# Patient Record
Sex: Female | Born: 1935 | Race: White | Hispanic: No | Marital: Married | Smoking: Never smoker
Health system: Southern US, Community
[De-identification: ages and names within clinical notes are randomized; demographics above are authoritative.]

## PROBLEM LIST (undated history)

## (undated) DIAGNOSIS — I639 Cerebral infarction, unspecified: Secondary | ICD-10-CM

## (undated) DIAGNOSIS — I82409 Acute embolism and thrombosis of unspecified deep veins of unspecified lower extremity: Secondary | ICD-10-CM

## (undated) DIAGNOSIS — K635 Polyp of colon: Secondary | ICD-10-CM

## (undated) DIAGNOSIS — I4891 Unspecified atrial fibrillation: Secondary | ICD-10-CM

## (undated) DIAGNOSIS — I10 Essential (primary) hypertension: Secondary | ICD-10-CM

## (undated) DIAGNOSIS — I2510 Atherosclerotic heart disease of native coronary artery without angina pectoris: Secondary | ICD-10-CM

## (undated) DIAGNOSIS — Z86718 Personal history of other venous thrombosis and embolism: Secondary | ICD-10-CM

## (undated) DIAGNOSIS — E785 Hyperlipidemia, unspecified: Secondary | ICD-10-CM

## (undated) DIAGNOSIS — K317 Polyp of stomach and duodenum: Secondary | ICD-10-CM

## (undated) HISTORY — DX: Cerebral infarction, unspecified: I63.9

## (undated) HISTORY — DX: Personal history of other venous thrombosis and embolism: Z86.718

## (undated) HISTORY — DX: Atherosclerotic heart disease of native coronary artery without angina pectoris: I25.10

## (undated) HISTORY — PX: OTHER SURGICAL HISTORY: SHX169

## (undated) HISTORY — DX: Polyp of colon: K63.5

## (undated) HISTORY — DX: Unspecified atrial fibrillation: I48.91

## (undated) HISTORY — DX: Essential (primary) hypertension: I10

## (undated) HISTORY — DX: Polyp of stomach and duodenum: K31.7

## (undated) HISTORY — DX: Acute embolism and thrombosis of unspecified deep veins of unspecified lower extremity: I82.409

## (undated) HISTORY — DX: Hyperlipidemia, unspecified: E78.5

---
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
## 1963-12-22 HISTORY — PX: ABDOMINAL HYSTERECTOMY: SHX81

## 1970-12-21 HISTORY — PX: BREAST SURGERY: SHX581

## 1973-12-21 HISTORY — PX: LAMINECTOMY: SHX219

## 1975-12-22 HISTORY — PX: THYROID SURGERY: SHX805

## 2004-12-18 ENCOUNTER — Ambulatory Visit: Payer: Self-pay | Admitting: Urology

## 2006-01-05 ENCOUNTER — Ambulatory Visit: Payer: Self-pay | Admitting: Unknown Physician Specialty

## 2006-01-05 ENCOUNTER — Other Ambulatory Visit: Payer: Self-pay

## 2006-01-18 ENCOUNTER — Ambulatory Visit: Payer: Self-pay | Admitting: Unknown Physician Specialty

## 2006-04-20 DIAGNOSIS — Z86718 Personal history of other venous thrombosis and embolism: Secondary | ICD-10-CM | POA: Insufficient documentation

## 2006-04-20 DIAGNOSIS — I82409 Acute embolism and thrombosis of unspecified deep veins of unspecified lower extremity: Secondary | ICD-10-CM

## 2006-04-20 HISTORY — DX: Acute embolism and thrombosis of unspecified deep veins of unspecified lower extremity: I82.409

## 2006-04-20 HISTORY — DX: Personal history of other venous thrombosis and embolism: Z86.718

## 2006-05-03 ENCOUNTER — Inpatient Hospital Stay: Payer: Self-pay | Admitting: Internal Medicine

## 2006-05-03 ENCOUNTER — Other Ambulatory Visit: Payer: Self-pay

## 2006-05-05 ENCOUNTER — Other Ambulatory Visit: Payer: Self-pay

## 2006-08-18 ENCOUNTER — Ambulatory Visit: Payer: Self-pay | Admitting: Internal Medicine

## 2006-12-21 DIAGNOSIS — K635 Polyp of colon: Secondary | ICD-10-CM

## 2006-12-21 HISTORY — PX: NASAL POLYP SURGERY: SHX186

## 2006-12-21 HISTORY — DX: Polyp of colon: K63.5

## 2007-03-09 ENCOUNTER — Ambulatory Visit: Payer: Self-pay | Admitting: Gastroenterology

## 2007-08-03 ENCOUNTER — Ambulatory Visit: Payer: Self-pay | Admitting: Unknown Physician Specialty

## 2008-02-03 ENCOUNTER — Ambulatory Visit: Payer: Self-pay | Admitting: Internal Medicine

## 2008-05-23 ENCOUNTER — Encounter: Payer: Self-pay | Admitting: Unknown Physician Specialty

## 2008-06-20 ENCOUNTER — Encounter: Payer: Self-pay | Admitting: Unknown Physician Specialty

## 2008-07-21 ENCOUNTER — Encounter: Payer: Self-pay | Admitting: Unknown Physician Specialty

## 2009-02-25 ENCOUNTER — Emergency Department: Payer: Self-pay | Admitting: Emergency Medicine

## 2009-03-05 ENCOUNTER — Ambulatory Visit: Payer: Self-pay | Admitting: Internal Medicine

## 2009-04-17 ENCOUNTER — Ambulatory Visit: Payer: Self-pay | Admitting: Family

## 2009-06-11 ENCOUNTER — Encounter: Payer: Self-pay | Admitting: Cardiovascular Disease

## 2009-06-13 ENCOUNTER — Encounter: Payer: Self-pay | Admitting: Cardiovascular Disease

## 2009-06-14 ENCOUNTER — Encounter: Payer: Self-pay | Admitting: Cardiovascular Disease

## 2009-07-01 ENCOUNTER — Encounter: Payer: Self-pay | Admitting: Internal Medicine

## 2009-07-08 ENCOUNTER — Encounter: Payer: Self-pay | Admitting: Cardiovascular Disease

## 2009-07-25 ENCOUNTER — Ambulatory Visit: Payer: Self-pay | Admitting: Internal Medicine

## 2009-08-08 ENCOUNTER — Ambulatory Visit: Payer: Self-pay | Admitting: Internal Medicine

## 2009-12-26 ENCOUNTER — Other Ambulatory Visit: Payer: Self-pay | Admitting: Family

## 2009-12-29 ENCOUNTER — Ambulatory Visit: Payer: Self-pay | Admitting: Internal Medicine

## 2010-06-18 ENCOUNTER — Encounter: Payer: Self-pay | Admitting: Cardiovascular Disease

## 2010-08-27 ENCOUNTER — Ambulatory Visit: Payer: Self-pay | Admitting: Cardiovascular Disease

## 2010-08-27 DIAGNOSIS — E785 Hyperlipidemia, unspecified: Secondary | ICD-10-CM

## 2010-08-27 DIAGNOSIS — R079 Chest pain, unspecified: Secondary | ICD-10-CM

## 2010-08-27 DIAGNOSIS — I4891 Unspecified atrial fibrillation: Secondary | ICD-10-CM | POA: Insufficient documentation

## 2010-08-27 DIAGNOSIS — R9439 Abnormal result of other cardiovascular function study: Secondary | ICD-10-CM

## 2010-09-22 ENCOUNTER — Emergency Department: Payer: Self-pay | Admitting: Emergency Medicine

## 2010-11-20 ENCOUNTER — Telehealth: Payer: Self-pay | Admitting: Cardiovascular Disease

## 2011-01-20 NOTE — Progress Notes (Signed)
Summary: RX  Phone Note Refill Request Call back at Home Phone [phone] Message from:  Patient on November 20, 2010 2:49 PM  Refills Requested: Medication #1:  ISOSORBIDE MONONITRATE CR 60 MG XR24H-TAB Take one tablet by mouth daily [facility] in [place]  Initial call taken by: Harlon Flor,  November 20, 2010 2:50 PM  Follow-up for Phone Call        Rx faxed to pharmacy Follow-up by: Bishop Dublin, CMA,  November 20, 2010 2:53 PM    Prescriptions: ISOSORBIDE MONONITRATE CR 60 MG XR24H-TAB (ISOSORBIDE MONONITRATE) Take one tablet by mouth daily  #30 x 3   Entered by:   Bishop Dublin, CMA   Authorized by:   Dossie Arbour MD   Signed by:   Bishop Dublin, CMA on 11/20/2010   Method used:   Electronically to        [facility]. # [phone]* (retail)       [address]       [place], [place]  [address]       Ph: [phone]       Fax: [phone]   RxID:   [id number]

## 2011-01-20 NOTE — Letter (Signed)
Summary: Historic Patient File  Historic Patient File   Imported By: West Carbo 06/18/2010 15:31:34  _____________________________________________________________________  External Attachment:    Type:   Image     Comment:   External Document

## 2011-01-20 NOTE — Miscellaneous (Signed)
Summary: refills  Clinical Lists Changes  Medications: Added new medication of NITROSTAT 0.4 MG SUBL (NITROGLYCERIN) 1 tablet under tongue at onset of chest pain; you may repeat every 5 minutes for up to 3 doses. - Signed Added new medication of ISOSORBIDE MONONITRATE CR 60 MG XR24H-TAB (ISOSORBIDE MONONITRATE) Take one tablet by mouth daily - Signed Rx of NITROSTAT 0.4 MG SUBL (NITROGLYCERIN) 1 tablet under tongue at onset of chest pain; you may repeat every 5 minutes for up to 3 doses.;  #25 x 0;  Signed;  Entered by: Benedict Needy, RN;  Authorized by: Lorine Bears MD;  Method used: Electronically to [facility] # [phone]*, [address] [place], [place], [place]  [address], Ph: [phone], Fax: [phone] Rx of ISOSORBIDE MONONITRATE CR 60 MG XR24H-TAB (ISOSORBIDE MONONITRATE) Take one tablet by mouth daily;  #30 x 0;  Signed;  Entered by: Benedict Needy, RN;  Authorized by: Lorine Bears MD;  Method used: Electronically to [facility] # [phone]*, [address] [place], [place], [place]  [address], Ph: [phone], Fax: [phone]    Prescriptions: ISOSORBIDE MONONITRATE CR 60 MG XR24H-TAB (ISOSORBIDE MONONITRATE) Take one tablet by mouth daily  #30 x 0   Entered by:   Benedict Needy, RN   Authorized by:   Lorine Bears MD   Signed by:   Benedict Needy, RN on 06/18/2010   Method used:   Electronically to        [facility]. # [id number]* (retail)       [address]       [place], [place]  [address]       Ph: [phone]       Fax: [phone]   RxID:   [id number] NITROSTAT 0.4 MG SUBL (NITROGLYCERIN) 1 tablet under tongue at onset of chest pain; you may repeat every 5 minutes for up to 3 doses.  #25 x 0   Entered by:   Benedict Needy, RN   Authorized by:   Lorine Bears MD   Signed by:   Benedict Needy, RN on 06/18/2010   Method used:   Electronically to        [facility]. # [phone]* (retail)       [address]       [place], [place]  [address]       Ph: [phone]       Fax: [phone]   RxID:   [id number]

## 2011-01-20 NOTE — Letter (Signed)
Summary: External Correspondence  External Correspondence   Imported By: West Carbo 06/18/2010 15:31:51  _____________________________________________________________________  External Attachment:    Type:   Image     Comment:   External Document

## 2011-01-20 NOTE — Letter (Signed)
Summary: PHI  PHI   Imported By: Harlon Flor 09/02/2010 13:48:17  _____________________________________________________________________  External Attachment:    Type:   Image     Comment:   External Document

## 2011-01-20 NOTE — Assessment & Plan Note (Signed)
Summary: NEW PT   Visit Type:  Initial Consult Referring Sahvannah Rieser:  Dr. Kirke Corin Primary Lawerence Dery:  Darrick Huntsman  CC:  Establish care with cardiology.  She also follow Dr. Wyn Quaker for vascular..  History of Present Illness: [name] is a very pleasant 77 year old woman with a history of atrial fibrillation on warfarin (permenant), , hyperlipidemia with notes indicating elevated liver function tests on statins, history of DVT, hypertension, bilateral mastectomy, presenting to establish care. She is a patient of Dr. Darrick Huntsman.  She reports that overall she is in good health. She has had significant workup done by Dr. Kirke Corin in 2010 including an echocardiogram and stress test. she denies any significant chest discomfort, no significant shortness of breath with exertion. Her gait is mildly unsteady and she does use a cane for longer distances.  Stress test dated June 2010 suggests mild ischemia in the inferolateral region, normal systolic function, ejection fraction 70%. Normal wall motion. This was a pharmacologic study.  Echocardiogram dated June 2000 and shows moderately dilated left atrium and right atrium, normal systolic function, moderate tricuspid regurgitation, moderate mitral regurgitation, mild pulmonary hypertension. Right ventricular systolic pressure of 42.  EKG shows atrial ablation with ventricular rate 64 beats per minute, poor R-wave progression through the precordial leads  Current Medications (verified): 1)  Nitrostat 0.4 Mg Subl (Nitroglycerin) .Marland Kitchen.. 1 Tablet Under Tongue At Onset of Chest Pain; You May Repeat Every 5 Minutes For Up To 3 Doses. 2)  Isosorbide Mononitrate Cr 60 Mg Xr24h-Tab (Isosorbide Mononitrate) .... Take One Tablet By Mouth Daily 3)  Calcium Carbonate 600 Mg Tabs (Calcium Carbonate) .Marland Kitchen.. 1 Tab Once Daily 4)  Indapamide 2.5 Mg Tabs (Indapamide) .Marland Kitchen.. 1 Tab Once Daily 5)  Klor-Con 10 10 Meq Cr-Tabs (Potassium Chloride) .... Two Tablets Once Daily 6)  Lisinopril 20 Mg Tabs  (Lisinopril) .Marland Kitchen.. 1 Tab Once Daily 7)  Metoprolol Tartrate 25 Mg Tabs (Metoprolol Tartrate) .... 1/2 Tab Two Times A Day 8)  Nystatin 100000 Unit Tabs (Nystatin) .Marland Kitchen.. 1 Tab Once Daily 9)  Warfarin Sodium 4 Mg Tabs (Warfarin Sodium) .Marland Kitchen.. 1 Tab Once Daily 10)  Coumadin 6 Mg Tabs (Warfarin Sodium) .... Take One Tablet On Friday 11)  Maalox Max 1000-60 Mg Chew (Calcium Carbonate-Simethicone) .... As Needed 12)  Optive Eye Drops .... As Needed  Allergies (verified): No Known Drug Allergies  Past History:  Past Medical History: Last updated: 06/25/2010 Atrial Fibrillation CAD Hyperlipidemia Hypertension  Past Surgical History: Last updated: 06/25/2010 laser surgery for varicose veins  Social History: Last updated: 06/25/2010 Tobacco Use - No.  Alcohol Use - yes- one glas of wine per day  Risk Factors: Smoking Status: never (06/25/2010)  Review of Systems       The patient complains of difficulty walking.  The patient denies fever, weight loss, weight gain, vision loss, decreased hearing, hoarseness, chest pain, syncope, dyspnea on exertion, peripheral edema, prolonged cough, abdominal pain, incontinence, muscle weakness, depression, and enlarged lymph nodes.    Vital Signs:  Patient profile:   77 year old female Height:      66 inches Weight:      130.50 pounds Pulse rate:   60 / minute BP sitting:   146 / 48  (left arm) Cuff size:   regular  Vitals Entered By: Bishop Dublin, CMA (August 27, 2010 2:12 PM)  Physical Exam  General:  Well developed, well nourished, in no acute distress. Head:  normocephalic and atraumatic Neck:  Neck supple, no JVD. No masses, thyromegaly or  abnormal cervical nodes. Lungs:  Clear bilaterally to auscultation and percussion. Heart:  Non-displaced PMI, chest non-tender; regular rate and rhythm, S1, S2 without murmurs, rubs or gallops. Carotid upstroke normal, no bruit.  Pedals normal pulses. No edema, no varicosities. Abdomen:   abdomen  soft and non-tender without masses Msk:  Back normal, normal gait. Muscle strength and tone normal. Pulses:  pulses normal in all 4 extremities Extremities:  No clubbing or cyanosis. Neurologic:  Alert and oriented x 3. Skin:  Intact without lesions or rashes. Psych:  Normal affect.   Impression & Recommendations:  Problem # 1:  ATRIAL FIBRILLATION (ICD-427.31) chronic atrial fibrillation with good rate control on low dose metoprolol. She will continue to have her INR checked by Dr. Darrick Huntsman.  Her updated medication list for this problem includes:    Metoprolol Tartrate 25 Mg Tabs (Metoprolol tartrate) .Marland Kitchen... 1/2 tab two times a day    Warfarin Sodium 4 Mg Tabs (Warfarin sodium) .Marland Kitchen... 1 tab once daily    Coumadin 6 Mg Tabs (Warfarin sodium) .Marland Kitchen... Take one tablet on friday  Problem # 2:  HYPERLIPIDEMIA-MIXED (ICD-272.4) She does report having elevated cholesterol. On medication, her lipids were well controlled though she reports having elevated LFTs. Other options would be to try alternate medications such as zetia or WelChol.  Problem # 3:  ABNORMAL CV (STRESS) TEST (ICD-794.39) There was a small region of ischemia in the inferolateral wall last year on stress testing though she has no symptoms. I would agree with close monitoring, continued exercise around her property. Have asked her to contact me if she has any symptoms of chest pressure or chest discomfort. She is not on aspirin as she is on warfarin.  Other Orders: EKG w/ Interpretation (93000)

## 2011-04-13 ENCOUNTER — Ambulatory Visit (INDEPENDENT_AMBULATORY_CARE_PROVIDER_SITE_OTHER): Payer: MEDICARE | Admitting: Cardiovascular Disease

## 2011-04-13 ENCOUNTER — Encounter: Payer: Self-pay | Admitting: Cardiovascular Disease

## 2011-04-13 DIAGNOSIS — E785 Hyperlipidemia, unspecified: Secondary | ICD-10-CM

## 2011-04-13 DIAGNOSIS — I4891 Unspecified atrial fibrillation: Secondary | ICD-10-CM

## 2011-04-13 DIAGNOSIS — I10 Essential (primary) hypertension: Secondary | ICD-10-CM | POA: Insufficient documentation

## 2011-04-13 NOTE — Patient Instructions (Signed)
You are doing well. No medication changes were made. Please call us if you have new issues that need to be addressed before your next appt.  We will call you for a follow up Appt. In 6 months  

## 2011-04-13 NOTE — Progress Notes (Signed)
   Patient ID: Latoya Merritt, female    DOB: 08/19/1920, 77 y.o.   MRN: [id number]  HPI Comments: Latoya Merritt is a very pleasant 77 year old woman, patient of Dr. Darrick Huntsman, with a history of chronic atrial fibrillation on warfarin, hyperlipidemia with notes indicating elevated liver function tests on statins, history of DVT, hypertension, bilateral mastectomy, presenting for routine follow up.   She has been doing well with does report an issue when she had a glass of wine on an empty stomach. She ended up passing out, had workup done at [facility] in the hospital and was discharged later that night. This was in late 2011. She has not had any further episodes since that time. She does have periodic swelling in her legs though has had laser vein ablation.   she denies any significant chest discomfort, no significant shortness of breath with exertion. Her gait is mildly unsteady and she does use a cane for longer distances.  She has had significant workup done by Dr. Kirke Corin in 2010 including an echocardiogram and stress test.  Stress test dated June 2010 suggests mild ischemia in the inferolateral region, normal systolic function, ejection fraction 70%. Normal wall motion. This was a pharmacologic study.  Echocardiogram dated June 2000 and shows moderately dilated left atrium and right atrium, normal systolic function, moderate tricuspid regurgitation, moderate mitral regurgitation, mild pulmonary hypertension. Right ventricular systolic pressure of 42.  EKG shows atrial fibrillation with ventricular rate 57 beats per minute, poor R-wave progression through the precordial leads, Nonspecific ST changes in leads 2, 3, aVF     Review of Systems  Constitutional: Negative.   HENT: Negative.   Eyes: Negative.   Respiratory: Negative.   Cardiovascular: Negative.   Gastrointestinal: Negative.   Skin: Negative.   Neurological: Negative.   Hematological: Negative.   Psychiatric/Behavioral: Negative.   All  other systems reviewed and are negative.   BP 120/62  Pulse 57  Ht 5\' 6"  (1.676 m)  Wt 131 lb 12.8 oz (59.784 kg)  BMI 21.27 kg/m2   Physical Exam  Nursing note and vitals reviewed. Constitutional: She is oriented to person, place, and time. She appears well-developed and well-nourished.       Elderly woman in no apparent distress.  HENT:  Head: Normocephalic.  Nose: Nose normal.  Mouth/Throat: Oropharynx is clear and moist.  Eyes: Conjunctivae are normal. Pupils are equal, round, and reactive to light.  Neck: Normal range of motion. Neck supple. No JVD present.  Cardiovascular: Normal rate, regular rhythm, normal heart sounds and intact distal pulses.  Exam reveals no gallop and no friction rub.   No murmur heard. Pulmonary/Chest: Effort normal and breath sounds normal. No respiratory distress. She has no wheezes. She has no rales. She exhibits no tenderness.  Abdominal: Soft. Bowel sounds are normal. She exhibits no distension. There is no tenderness.  Musculoskeletal: Normal range of motion. She exhibits no edema and no tenderness.  Lymphadenopathy:    She has no cervical adenopathy.  Neurological: She is alert and oriented to person, place, and time. Coordination normal.  Skin: Skin is warm and dry. No rash noted. No erythema.  Psychiatric: She has a normal mood and affect. Her behavior is normal. Judgment and thought content normal.         Assessment and Plan

## 2011-04-13 NOTE — Assessment & Plan Note (Signed)
Heart rate is well controlled. We have suggested she stay on her current medication regimen. She is currently on warfarin.

## 2011-04-13 NOTE — Assessment & Plan Note (Signed)
Blood pressure is well controlled on today's visit. No changes made to the medications. 

## 2011-04-13 NOTE — Assessment & Plan Note (Signed)
We have suggested she stay on her current cholesterol medication. We will try to obtain her most recent cholesterol numbers for our records.

## 2011-05-27 LAB — PROTIME-INR

## 2011-05-27 LAB — POCT INR: INR: 5.2 — AB (ref <=1.1)

## 2011-07-21 ENCOUNTER — Telehealth: Payer: Self-pay

## 2011-07-21 MED ORDER — ISOSORBIDE MONONITRATE ER 60 MG PO TB24: 60.0000 mg | ORAL_TABLET | Freq: Every day | ORAL | Status: DC

## 2011-07-21 NOTE — Telephone Encounter (Signed)
Needs a refill isosorbide mono sent to [facility] in [place].

## 2011-07-21 NOTE — Telephone Encounter (Signed)
Needs a refill sent to [facility] in [place] for isosorbide mono

## 2011-08-29 ENCOUNTER — Other Ambulatory Visit: Payer: Self-pay | Admitting: Internal Medicine

## 2011-09-09 ENCOUNTER — Telehealth: Payer: Self-pay | Admitting: *Deleted

## 2011-09-09 DIAGNOSIS — Z7901 Long term (current) use of anticoagulants: Secondary | ICD-10-CM

## 2011-09-09 LAB — PROTIME-INR

## 2011-09-09 NOTE — Telephone Encounter (Signed)
Patient is asking for an order to have her protime checked done at [facility] due to her insurance. She is asking for a standing order to be faxed over there. I have printed out the order for Dr. Darrick Huntsman to sign so that it can be faxed to [facility].

## 2011-09-09 NOTE — Telephone Encounter (Signed)
Faxed standing order for PT/INR to [facility].

## 2011-09-15 ENCOUNTER — Telehealth: Payer: Self-pay | Admitting: Internal Medicine

## 2011-09-15 NOTE — Telephone Encounter (Signed)
See below note.

## 2011-09-15 NOTE — Telephone Encounter (Signed)
PT HAD PT/INR DONE LAST Thursday @ [facility] SHE WOULD LIKE TO KNOW THE RESULTS.  DR Sibyl Parr MCQUEEN GAVE HER RX FOR A VIRUS   THE RX ARE HYDROCODONE CHLORPHEN ER SUSPENSION  TAKE  AT NIGHT AND CLARITHROMYCIN TAKE 1 DAILY  AND SHE TAKES 4MG  WORFIN EVERY DAY.    PLEASE ADVISE LABS RESULTS

## 2011-09-15 NOTE — Telephone Encounter (Signed)
her PT/INR was therapuetic as of 9/19.  But if she has been taking clarithromycin it may raise her coumadin level for a few days.  I would recommend taking  coumadin every other night while she is on the clarithromycin to avoid her blood getting too thin.

## 2011-09-16 NOTE — Telephone Encounter (Signed)
Notified patient of coumadin results.  Also notified her while taking clarithromycin take coumadin every other night to avoid her blood getting too thin.

## 2011-09-18 ENCOUNTER — Telehealth: Payer: Self-pay | Admitting: Internal Medicine

## 2011-09-18 NOTE — Telephone Encounter (Signed)
error 

## 2011-09-18 NOTE — Telephone Encounter (Signed)
Please give her the 4 :30 appt today

## 2011-09-18 NOTE — Telephone Encounter (Signed)
Pt still has a lot of phlagm causing pt to cough.  Pt said her left leg  And knee is hurting when walking.  This hurts all the way to the hip area

## 2011-09-18 NOTE — Telephone Encounter (Signed)
please give her an appt

## 2011-09-21 ENCOUNTER — Encounter: Payer: Self-pay | Admitting: Internal Medicine

## 2011-09-21 ENCOUNTER — Ambulatory Visit (INDEPENDENT_AMBULATORY_CARE_PROVIDER_SITE_OTHER): Payer: Self-pay | Admitting: Internal Medicine

## 2011-09-21 VITALS — BP 140/72 | HR 72 | Temp 98.2°F | Resp 16 | Ht 66.5 in | Wt 129.2 lb

## 2011-09-21 DIAGNOSIS — Z7901 Long term (current) use of anticoagulants: Secondary | ICD-10-CM

## 2011-09-21 DIAGNOSIS — I10 Essential (primary) hypertension: Secondary | ICD-10-CM

## 2011-09-21 DIAGNOSIS — I4891 Unspecified atrial fibrillation: Secondary | ICD-10-CM

## 2011-09-21 DIAGNOSIS — J399 Disease of upper respiratory tract, unspecified: Secondary | ICD-10-CM

## 2011-09-21 DIAGNOSIS — J398 Other specified diseases of upper respiratory tract: Secondary | ICD-10-CM

## 2011-09-21 DIAGNOSIS — Z86718 Personal history of other venous thrombosis and embolism: Secondary | ICD-10-CM | POA: Insufficient documentation

## 2011-09-21 DIAGNOSIS — Z0189 Encounter for other specified special examinations: Secondary | ICD-10-CM

## 2011-09-21 DIAGNOSIS — Z79899 Other long term (current) drug therapy: Secondary | ICD-10-CM

## 2011-09-21 MED ORDER — NITROGLYCERIN 0.4 MG SL SUBL: 0.4000 mg | SUBLINGUAL_TABLET | SUBLINGUAL | Status: DC | PRN

## 2011-09-21 NOTE — Patient Instructions (Addendum)
I want you to try Delsym for nighttime cough.  It is available over the counter.   Resume your prescription nasal spray,  But use Simply Saline nasal spray twice daily to flush sinuses out BEFORE you use the prescription nasal spray.

## 2011-09-21 NOTE — Progress Notes (Signed)
Subjective:    Patient ID: Latoya Merritt, female    DOB: 1920-09-10, 77 y.o.   MRN: [id number]  HPI 77 yo wf with history of atrial fibrillation , on chronic anticoagulation, treated last Wednesday for unresolving URI by ENT with clarithromycin and tussionex on 09/14/11. She developed rash on chest and legs which she has attributed to an allergic reaction to both medications. Her sinus pain and purulent discharge have resolved.   Past Medical History  Diagnosis Date  . Coronary artery disease   . Atrial fibrillation   . Polyp, stomach   . Colon polyp 2008  . Sensorineural hearing loss 2006    sudden, left ear  . DVT (deep venous thrombosis) May 2007  . History of DVT of lower extremity May 2007  . Hyperlipidemia   . Hypertension     Current Outpatient Prescriptions on File Prior to Visit  Medication Sig Dispense Refill  . calcium carbonate (OS-CAL) 600 MG TABS Take 600 mg by mouth daily.        . Calcium Carbonate-Simethicone (MAALOX ADVANCED MAX ST) 1000-60 MG CHEW Chew 1 tablet by mouth as needed.        . Carboxymethylcellul-Glycerin 0.5-0.9 % SOLN Apply to eye as needed.        . isosorbide mononitrate (IMDUR) 60 MG 24 hr tablet Take 1 tablet (60 mg total) by mouth daily.  30 tablet  6  . KLOR-CON 10 10 MEQ CR tablet TAKE 2 TABLETS BY MOUTH EVERY DAY  60 tablet  0  . lisinopril (PRINIVIL,ZESTRIL) 20 MG tablet Take 20 mg by mouth daily.        . metoprolol tartrate (LOPRESSOR) 25 MG tablet Take 12.5 mg by mouth 2 (two) times daily.        Marland Kitchen nystatin 100000 UNITS vaginal tablet Place 1 tablet vaginally at bedtime.        Marland Kitchen warfarin (COUMADIN) 4 MG tablet Take 4 mg by mouth daily.          Review of Systems  Constitutional: Negative for fever, chills and unexpected weight change.  HENT: Negative for hearing loss, ear pain, nosebleeds, congestion, sore throat, facial swelling, rhinorrhea, sneezing, mouth sores, trouble swallowing, neck pain, neck stiffness, voice change, postnasal  drip, sinus pressure, tinnitus and ear discharge.   Eyes: Negative for pain, discharge, redness and visual disturbance.  Respiratory: Negative for cough, chest tightness, shortness of breath, wheezing and stridor.   Cardiovascular: Negative for chest pain, palpitations and leg swelling.  Musculoskeletal: Negative for myalgias and arthralgias.  Skin: Negative for color change and rash.  Neurological: Negative for dizziness, weakness, light-headedness and headaches.  Hematological: Negative for adenopathy.       Objective:   Physical Exam  Constitutional: She is oriented to person, place, and time. She appears well-developed and well-nourished.  HENT:  Mouth/Throat: Oropharynx is clear and moist.  Eyes: EOM are normal. Pupils are equal, round, and reactive to light. No scleral icterus.  Neck: Normal range of motion. Neck supple. No JVD present. No thyromegaly present.  Cardiovascular: Normal rate, regular rhythm, normal heart sounds and intact distal pulses.   Pulmonary/Chest: Effort normal and breath sounds normal.  Abdominal: Soft. Bowel sounds are normal. She exhibits no mass. There is no tenderness.  Musculoskeletal: Normal range of motion. She exhibits no edema.  Lymphadenopathy:    She has no cervical adenopathy.  Neurological: She is alert and oriented to person, place, and time.  Skin: Skin is warm and dry.  Psychiatric: She has a normal mood and affect.          Assessment & Plan:  Sinusitis:  Her recent infection was treated for a few days before she stopped her antibiotics due to an allergic reaction to one or both.  She is currently asymptomatic except for a mild dry nighttime cough. Will treat cough but not resume antibiotics.

## 2011-09-22 ENCOUNTER — Encounter: Payer: Self-pay | Admitting: Internal Medicine

## 2011-09-22 DIAGNOSIS — I10 Essential (primary) hypertension: Secondary | ICD-10-CM | POA: Insufficient documentation

## 2011-09-22 DIAGNOSIS — E785 Hyperlipidemia, unspecified: Secondary | ICD-10-CM | POA: Insufficient documentation

## 2011-09-22 DIAGNOSIS — K635 Polyp of colon: Secondary | ICD-10-CM | POA: Insufficient documentation

## 2011-09-22 NOTE — Assessment & Plan Note (Signed)
Currently ate controlled and anticoagulated.  No changes today

## 2011-09-22 NOTE — Assessment & Plan Note (Signed)
Currently controlled,. No medication changes today.

## 2011-09-28 ENCOUNTER — Other Ambulatory Visit: Payer: Self-pay | Admitting: Internal Medicine

## 2011-09-29 ENCOUNTER — Encounter: Payer: Self-pay | Admitting: Internal Medicine

## 2011-10-12 ENCOUNTER — Telehealth: Payer: Self-pay | Admitting: *Deleted

## 2011-10-12 NOTE — Telephone Encounter (Signed)
Message copied by Jobie Quaker on Mon Oct 12, 2011  2:33 PM ------      Message from: Duncan Dull      Created: Mon Oct 12, 2011  1:48 PM      Regarding: pt/inr       Her coumadin level is low.  Did she have to stop it for a procedure?

## 2011-10-12 NOTE — Telephone Encounter (Signed)
Patient notified

## 2011-10-12 NOTE — Telephone Encounter (Signed)
Patient says that she has not missed a dose and has been taking 4 mg daily.

## 2011-10-12 NOTE — Telephone Encounter (Signed)
Please ask her to take 8 mg tonight and on Thrusdays,  4 mg all other days. Repeat INR in 2 weeks

## 2011-10-21 LAB — PROTIME-INR

## 2011-10-22 ENCOUNTER — Encounter: Payer: Self-pay | Admitting: Cardiovascular Disease

## 2011-10-26 ENCOUNTER — Encounter: Payer: Self-pay | Admitting: Cardiovascular Disease

## 2011-10-26 ENCOUNTER — Ambulatory Visit (INDEPENDENT_AMBULATORY_CARE_PROVIDER_SITE_OTHER): Payer: Medicare Other | Admitting: Cardiovascular Disease

## 2011-10-26 ENCOUNTER — Telehealth: Payer: Self-pay | Admitting: Internal Medicine

## 2011-10-26 DIAGNOSIS — E785 Hyperlipidemia, unspecified: Secondary | ICD-10-CM

## 2011-10-26 DIAGNOSIS — M542 Cervicalgia: Secondary | ICD-10-CM | POA: Insufficient documentation

## 2011-10-26 DIAGNOSIS — Z86718 Personal history of other venous thrombosis and embolism: Secondary | ICD-10-CM

## 2011-10-26 DIAGNOSIS — I10 Essential (primary) hypertension: Secondary | ICD-10-CM

## 2011-10-26 DIAGNOSIS — I4891 Unspecified atrial fibrillation: Secondary | ICD-10-CM

## 2011-10-26 NOTE — Assessment & Plan Note (Signed)
She has had workup in the past for her neck pain. She reports that she has seen Dr. Jenne Campus, Dr. Wyn Quaker. It was suggested that she see a specialist for the swelling on the right side of her neck but she has not done so. It would appear that there has been no significant progression of the swelling. She has had rare episodes of pain shooting up to her right temple. Etiology is uncertain bypassed her to talk with Dr. Jenne Campus if she continues to have these episodes.

## 2011-10-26 NOTE — Telephone Encounter (Signed)
Notified patient of results.  She stated she saw Dr. Mariah Milling and mentioned to him that she has been having neck and chin pain and said there is a noticeable lump there.  On Friday, she said she had terrible pain in the temple area.  She said you checked her thyroid level in October but she has not heard from those results.  I will request from [facility].  Dr. Mariah Milling suggested she either follow up with you or Dr. Jenne Campus, but she wanted to know if this pain was coming from her thyroid before she made an appt anywhere.

## 2011-10-26 NOTE — Assessment & Plan Note (Signed)
Statins on hold secondary to elevated LFTs

## 2011-10-26 NOTE — Assessment & Plan Note (Signed)
Blood pressure is well controlled on today's visit. No changes made to the medications. 

## 2011-10-26 NOTE — Patient Instructions (Addendum)
You are doing well. No medication changes were made.  Please try zantac and pepcid for acid reflux.  Please call us if you have new issues that need to be addressed before your next appt.  The office will contact you for a follow up Appt. In 12 months

## 2011-10-26 NOTE — Telephone Encounter (Signed)
Coumadin level is therapeutic,  Continue current regimen and repeat PT/INR in one month 

## 2011-10-26 NOTE — Assessment & Plan Note (Signed)
History of DVT. Currently on warfarin.

## 2011-10-26 NOTE — Assessment & Plan Note (Signed)
Rate is well controlled on her current medication regimen. No further changes made.

## 2011-10-26 NOTE — Progress Notes (Signed)
Patient ID: Latoya Merritt, female    DOB: [birth year], 77 y.o.   MRN: [id number]  HPI Comments: Latoya Merritt is a very pleasant 77 year old woman with a history of atrial fibrillation on warfarin (permenant), , hyperlipidemia with notes indicating elevated liver function tests on statins, history of DVT, hypertension, bilateral mastectomy, presenting for routine followup She is a patient of Dr. Darrick Huntsman. echocardiogram and stress test in 2010   she denies any significant chest discomfort, no significant shortness of breath with exertion.  She does report having pain in the right side of her neck, radiating up to her temple. This has happened twice. She reports having some workup for a swelling on the right side of her neck though she did not followup with a specialist. She feels that the lump is about the same size. She does have mild swelling in her left leg.  Her gait is mildly unsteady and she does use a cane for longer distances. She does report having some burning in her chest at times, better with TUMS.   Stress test dated June 2010 suggests mild ischemia in the inferolateral region, normal systolic function, ejection fraction 70%. Normal wall motion. This was a pharmacologic study.   Echocardiogram dated June 2000 and shows moderately dilated left atrium and right atrium, normal systolic function, moderate tricuspid regurgitation, moderate mitral regurgitation, mild pulmonary hypertension. Right ventricular systolic pressure of 42.   EKG shows atrial Fibrillation with ventricular rate 67 beats per minute, poor R-wave progression through the precordial leads    Outpatient Encounter Prescriptions as of 10/26/2011  Medication Sig Dispense Refill  . calcium carbonate (OS-CAL) 600 MG TABS Take 600 mg by mouth daily.        . Calcium Carbonate-Simethicone (MAALOX ADVANCED MAX ST) 1000-60 MG CHEW Chew 1 tablet by mouth as needed.        . Carboxymethylcellul-Glycerin 0.5-0.9 % SOLN Apply to eye as  needed.        . isosorbide mononitrate (IMDUR) 60 MG 24 hr tablet Take 1 tablet (60 mg total) by mouth daily.  30 tablet  6  . KLOR-CON 10 10 MEQ CR tablet TAKE 2 TABLETS BY MOUTH EVERY DAY  60 tablet  3  . lisinopril (PRINIVIL,ZESTRIL) 20 MG tablet Take 20 mg by mouth daily.        . metoprolol tartrate (LOPRESSOR) 25 MG tablet Take 12.5 mg by mouth 2 (two) times daily.        . nitroGLYCERIN (NITROSTAT) 0.4 MG SL tablet Place 1 tablet (0.4 mg total) under the tongue every 5 (five) minutes as needed.  90 tablet  2  . nystatin 100000 UNITS vaginal tablet Place 1 tablet vaginally at bedtime.        Marland Kitchen warfarin (COUMADIN) 4 MG tablet Take 4 mg by mouth daily.           Review of Systems  Constitutional: Negative.   HENT: Negative.   Eyes: Negative.   Respiratory: Negative.   Cardiovascular: Positive for leg swelling.  Gastrointestinal: Negative.   Musculoskeletal: Negative.        Pain in the right aspect of her neck radiating up to her temple  Skin: Negative.   Neurological: Negative.   Hematological: Negative.   Psychiatric/Behavioral: Negative.   All other systems reviewed and are negative.     BP 122/64  Pulse 65  Ht 5\' 6"  (1.676 m)  Wt 130 lb (58.968 kg)  BMI 20.98 kg/m2  Physical Exam  Nursing note  and vitals reviewed. Constitutional: She is oriented to person, place, and time. She appears well-developed and well-nourished.  HENT:  Head: Normocephalic.  Nose: Nose normal.  Mouth/Throat: Oropharynx is clear and moist.  Eyes: Conjunctivae are normal. Pupils are equal, round, and reactive to light.  Neck: Normal range of motion. Neck supple. No JVD present.  Cardiovascular: Normal rate, S1 normal, S2 normal, normal heart sounds and intact distal pulses.  An irregularly irregular rhythm present. Exam reveals no gallop and no friction rub.   No murmur heard. Pulmonary/Chest: Effort normal and breath sounds normal. No respiratory distress. She has no wheezes. She has no  rales. She exhibits no tenderness.  Abdominal: Soft. Bowel sounds are normal. She exhibits no distension. There is no tenderness.  Musculoskeletal: Normal range of motion. She exhibits no edema and no tenderness.  Lymphadenopathy:    She has no cervical adenopathy.  Neurological: She is alert and oriented to person, place, and time. Coordination normal.  Skin: Skin is warm and dry. No rash noted. No erythema.  Psychiatric: She has a normal mood and affect. Her behavior is normal. Judgment and thought content normal.         Assessment and Plan

## 2011-10-27 ENCOUNTER — Telehealth: Payer: Self-pay | Admitting: Internal Medicine

## 2011-10-27 NOTE — Telephone Encounter (Signed)
Notified patient of message, she stated she will either make an appt to be seen with you or Dr. Jenne Campus.

## 2011-10-27 NOTE — Telephone Encounter (Signed)
She did not have a thyroid test in October,  Only a PT/INR and CMET

## 2011-10-27 NOTE — Telephone Encounter (Signed)
I have no idea, since I have not seen her about this, but im inclined to say "no" .  I don't know how a thyroid can cause temple pain

## 2011-10-27 NOTE — Telephone Encounter (Signed)
Notified patient of message.

## 2011-10-28 ENCOUNTER — Encounter: Payer: Self-pay | Admitting: Internal Medicine

## 2011-11-04 ENCOUNTER — Telehealth: Payer: Self-pay | Admitting: Internal Medicine

## 2011-11-04 NOTE — Telephone Encounter (Signed)
Patient having pain in neck behind ear . Needs a referral to someone that will tell her why she is having these pains.

## 2011-11-04 NOTE — Telephone Encounter (Signed)
Please offer her an appt. I do not refer patients  To specialists without seeing the patient first because I have no idea what specialist to send her to

## 2011-11-05 NOTE — Telephone Encounter (Signed)
I will call patient to set up appointment.

## 2011-11-06 NOTE — Telephone Encounter (Signed)
Patient appointment has been set.

## 2011-11-16 ENCOUNTER — Other Ambulatory Visit: Payer: Self-pay | Admitting: Internal Medicine

## 2011-11-16 MED ORDER — METOPROLOL TARTRATE 25 MG PO TABS: 12.5000 mg | ORAL_TABLET | Freq: Two times a day (BID) | ORAL | Status: DC

## 2011-11-16 NOTE — Telephone Encounter (Signed)
Metoprolol 12.5 mg (1/2 tablet 2 timesd daily )  #30 with 6 refills authorized

## 2011-11-17 ENCOUNTER — Ambulatory Visit (INDEPENDENT_AMBULATORY_CARE_PROVIDER_SITE_OTHER): Payer: Medicare Other | Admitting: Internal Medicine

## 2011-11-17 ENCOUNTER — Encounter: Payer: Self-pay | Admitting: Internal Medicine

## 2011-11-17 VITALS — BP 144/80 | HR 84 | Temp 98.2°F | Resp 14 | Ht 66.0 in | Wt 132.5 lb

## 2011-11-17 DIAGNOSIS — R221 Localized swelling, mass and lump, neck: Secondary | ICD-10-CM

## 2011-11-17 DIAGNOSIS — R220 Localized swelling, mass and lump, head: Secondary | ICD-10-CM

## 2011-11-17 NOTE — Patient Instructions (Signed)
You right sided neck pain is from a swollen gland that is reacting from your previous respiratory infection.    We should recheck it in one month and if still enlarged,  We will ask dr. Jenne Campus to biopsy it.

## 2011-11-17 NOTE — Progress Notes (Signed)
Subjective:    Patient ID: Latoya Merritt, female    DOB: [birth year], 77 y.o.   MRN: [id number]  HPI  77 yo white female with a history of atrial fibrillation, on chronic anticoagulation presents with a complaint of right sided neck swelling and pain from right temple to right clavicle aggravated by chewing.  Apparently she was evaluated already by Cardiology and ENT.  She was presumed to have an enlarged LN by Dr. Mariah Milling and states that she was sent to Dr. Jenne Campus for prednisone which was denied. Records from Dr. Jenne Campus not currently available.  Patient called my office requesting a referral to a "neck specialist" so I brought her in for clarification.  She states that her neck pain , headache and mass have now improved, but started around the first of October when she was treated for an Upper respiratory infection.  She has several family members who have been treated for various conditions (benign to malignant) involving the neck and is intent on describing each one at length, without enough detail for me to discern any possbile diagnosis, and is concerned that she has a similar condition.  She is concerned because she reports that there is a persistent "back spot" on prior imaging of her neck that has not been diagnosed as an aneursym or a tumor despite evaluation by Dr. Wyn Quaker and Dr. Jenne Campus.  Past Medical History  Diagnosis Date  . Coronary artery disease   . Atrial fibrillation   . Polyp, stomach   . Colon polyp 2008  . Sensorineural hearing loss 2006    sudden, left ear  . DVT (deep venous thrombosis) May 2007  . History of DVT of lower extremity May 2007  . Hyperlipidemia   . Hypertension    Current Outpatient Prescriptions on File Prior to Visit  Medication Sig Dispense Refill  . calcium carbonate (OS-CAL) 600 MG TABS Take 600 mg by mouth daily.        . Calcium Carbonate-Simethicone (MAALOX ADVANCED MAX ST) 1000-60 MG CHEW Chew 1 tablet by mouth as needed.        .  Carboxymethylcellul-Glycerin 0.5-0.9 % SOLN Apply to eye as needed.        . isosorbide mononitrate (IMDUR) 60 MG 24 hr tablet Take 1 tablet (60 mg total) by mouth daily.  30 tablet  6  . KLOR-CON 10 10 MEQ CR tablet TAKE 2 TABLETS BY MOUTH EVERY DAY  60 tablet  3  . lisinopril (PRINIVIL,ZESTRIL) 20 MG tablet Take 20 mg by mouth daily.        . metoprolol tartrate (LOPRESSOR) 25 MG tablet Take 0.5 tablets (12.5 mg total) by mouth 2 (two) times daily.  30 tablet  6  . nitroGLYCERIN (NITROSTAT) 0.4 MG SL tablet Place 1 tablet (0.4 mg total) under the tongue every 5 (five) minutes as needed.  90 tablet  2  . nystatin 100000 UNITS vaginal tablet Place 1 tablet vaginally at bedtime.        Marland Kitchen warfarin (COUMADIN) 4 MG tablet Take 4 mg by mouth daily.           Review of Systems  Constitutional: Negative for fever, chills and unexpected weight change.  HENT: Negative for hearing loss, ear pain, nosebleeds, congestion, sore throat, facial swelling, rhinorrhea, sneezing, mouth sores, trouble swallowing, neck pain, neck stiffness, voice change, postnasal drip, sinus pressure, tinnitus and ear discharge.   Eyes: Negative for pain, discharge, redness and visual disturbance.  Respiratory: Negative for cough, chest  tightness, shortness of breath, wheezing and stridor.   Cardiovascular: Negative for chest pain, palpitations and leg swelling.  Musculoskeletal: Negative for myalgias and arthralgias.  Skin: Negative for color change and rash.  Neurological: Positive for headaches. Negative for dizziness, weakness and light-headedness.  Hematological: Negative for adenopathy.   BP 144/80  Pulse 84  Temp(Src) 98.2 F (36.8 C) (Oral)  Resp 14  Ht 5\' 6"  (1.676 m)  Wt 132 lb 8 oz (60.102 kg)  BMI 21.39 kg/m2  SpO2 100%     Objective:   Physical Exam  Constitutional: She is oriented to person, place, and time. She appears well-developed and well-nourished.  HENT:  Mouth/Throat: Oropharynx is clear and  moist.  Eyes: EOM are normal. Pupils are equal, round, and reactive to light. No scleral icterus.  Neck: Normal range of motion. Neck supple. No JVD present. No thyromegaly present.  Cardiovascular: Normal rate, regular rhythm, normal heart sounds and intact distal pulses.   Pulmonary/Chest: Effort normal and breath sounds normal.  Abdominal: Soft. Bowel sounds are normal. She exhibits no mass. There is no tenderness.  Musculoskeletal: Normal range of motion. She exhibits no edema.  Lymphadenopathy:    She has no cervical adenopathy.  Neurological: She is alert and oriented to person, place, and time. A cranial nerve deficit is present.       persisent hoarsenes  Skin: Skin is warm and dry.  Psychiatric: Her speech is normal and behavior is normal. Thought content normal. Her mood appears anxious. Cognition and memory are normal. She expresses impulsivity.       Speech is normal but she is a bit garrulous and has difficulty staying on point       Assessment & Plan:  Neck mass: her exam today is nearly normal. I do appreciate an enlarged right anterior chain LN which is soft and mobile and nontender .  I suggested that we repeat an exam in one month if still enlarged we will ak Dr. Jenne Campus to biopsy it. Will review prior imaging studies and request records from Vascular and ENT to try to discern any undiagnosed pathology on the right that is casuing her any concern to try to alleviate her anxiety.

## 2011-11-18 ENCOUNTER — Encounter: Payer: Self-pay | Admitting: Internal Medicine

## 2011-11-19 ENCOUNTER — Telehealth: Payer: Self-pay | Admitting: Internal Medicine

## 2011-11-19 NOTE — Telephone Encounter (Signed)
Notified patient of results 

## 2011-11-19 NOTE — Telephone Encounter (Signed)
Her protiem is therapuietic,  Repeat in one month

## 2011-12-09 ENCOUNTER — Inpatient Hospital Stay: Payer: Self-pay | Admitting: Internal Medicine

## 2011-12-09 DIAGNOSIS — I059 Rheumatic mitral valve disease, unspecified: Secondary | ICD-10-CM

## 2011-12-11 ENCOUNTER — Other Ambulatory Visit: Payer: Self-pay | Admitting: *Deleted

## 2011-12-11 MED ORDER — WARFARIN SODIUM 4 MG PO TABS: 4.0000 mg | ORAL_TABLET | Freq: Every day | ORAL | Status: DC

## 2011-12-11 MED ORDER — PRAVASTATIN SODIUM 20 MG PO TABS: 20.0000 mg | ORAL_TABLET | Freq: Every day | ORAL | Status: DC

## 2011-12-11 MED ORDER — WARFARIN SODIUM 1 MG PO TABS: 1.0000 mg | ORAL_TABLET | ORAL | Status: DC

## 2011-12-11 NOTE — Telephone Encounter (Signed)
Refills sent to [facility] in [place]

## 2011-12-13 ENCOUNTER — Emergency Department: Payer: Self-pay | Admitting: Emergency Medicine

## 2011-12-17 ENCOUNTER — Telehealth: Payer: Self-pay | Admitting: *Deleted

## 2011-12-17 NOTE — Telephone Encounter (Signed)
Latoya Merritt with [facility] called. She was advising you that patient went to ER for leg swelling over the weekend. No blood clot found on exam there. PT assistant is at her house now and advises that her leg is warm but her foot is cold with decreased cap refill. She has appt tomorrow morning with Dr. Wyn Quaker (vascular). I advised to go to ER tonight if worsening or if SOB/CP. Otherwise await vascular consult tomorrow. She verbalized understanding.

## 2011-12-18 ENCOUNTER — Ambulatory Visit: Payer: Medicare Other | Admitting: Internal Medicine

## 2011-12-18 LAB — PROTIME-INR

## 2011-12-21 ENCOUNTER — Telehealth: Payer: Self-pay | Admitting: *Deleted

## 2011-12-21 NOTE — Telephone Encounter (Signed)
Advised pt that protime is high.  Per Dr. Darrick Huntsman, advised her to hold coumadin for today and tomorrow, restart at 4 mg's on Thursday, recheck PT/ INR on Monday 12/28/11.

## 2011-12-28 ENCOUNTER — Other Ambulatory Visit: Payer: Self-pay | Admitting: *Deleted

## 2011-12-28 LAB — PROTIME-INR

## 2011-12-28 MED ORDER — INDAPAMIDE 2.5 MG PO TABS: 2.5000 mg | ORAL_TABLET | ORAL | Status: DC

## 2011-12-30 ENCOUNTER — Ambulatory Visit (INDEPENDENT_AMBULATORY_CARE_PROVIDER_SITE_OTHER): Payer: 59 | Admitting: Internal Medicine

## 2011-12-30 ENCOUNTER — Telehealth: Payer: Self-pay | Admitting: Internal Medicine

## 2011-12-30 ENCOUNTER — Encounter: Payer: Self-pay | Admitting: Internal Medicine

## 2011-12-30 VITALS — BP 150/74 | HR 66 | Temp 98.4°F | Wt 131.0 lb

## 2011-12-30 DIAGNOSIS — K047 Periapical abscess without sinus: Secondary | ICD-10-CM

## 2011-12-30 DIAGNOSIS — R4189 Other symptoms and signs involving cognitive functions and awareness: Secondary | ICD-10-CM

## 2011-12-30 DIAGNOSIS — I639 Cerebral infarction, unspecified: Secondary | ICD-10-CM

## 2011-12-30 DIAGNOSIS — F09 Unspecified mental disorder due to known physiological condition: Secondary | ICD-10-CM

## 2011-12-30 DIAGNOSIS — Z7901 Long term (current) use of anticoagulants: Secondary | ICD-10-CM

## 2011-12-30 DIAGNOSIS — I6350 Cerebral infarction due to unspecified occlusion or stenosis of unspecified cerebral artery: Secondary | ICD-10-CM

## 2011-12-30 MED ORDER — DOXYCYCLINE HYCLATE 100 MG PO TABS: 100.0000 mg | ORAL_TABLET | Freq: Two times a day (BID) | ORAL | Status: AC

## 2011-12-30 MED ORDER — METRONIDAZOLE 500 MG PO TABS: 500.0000 mg | ORAL_TABLET | Freq: Three times a day (TID) | ORAL | Status: AC

## 2011-12-30 NOTE — Telephone Encounter (Signed)
Pro time is therapeutic, continue current dose, Can you  Set up a monthly repeat  For PT/INR  V58.61with [facility]

## 2011-12-30 NOTE — Telephone Encounter (Signed)
Patient notified. Faxed standing order to [facility]

## 2011-12-30 NOTE — Progress Notes (Signed)
Subjective:    Patient ID: Latoya Merritt, female    DOB: 19-Feb-1920, 78 y.o.   MRN: [id number]  HPI  Latoya Merritt is a very pleasant, garrulous 78 yr old woman with a history of atrial fibrillation, on chronic anticoagulation with coumadin who presents today for hospital follow up after having a mild left brain CVA  Which caused transient slurred speec and sustained right arm/hadn weakness which has been imporving with home PT.  She has not returned to driving yet but is now able to close her hand and make a fist.  She has had no follows of aidditonal epsiodes of slurred speech.  She has a new complaint of sudden onset of left maxillary pain at the TMJ location which occurred while eating a moderately soft meal of summer sausage on bread and potato chips.  The pain progressed and now she reports an ear ache. There is no change after using some unnamed drops in ears previously prescribed by ENT. She has been having dental work as recently as 3 weeks ago but on the opposite side.  Past Medical History  Diagnosis Date  . Coronary artery disease   . Atrial fibrillation   . Polyp, stomach   . Colon polyp 2008  . Sensorineural hearing loss 2006    sudden, left ear  . DVT (deep venous thrombosis) May 2007  . History of DVT of lower extremity May 2007  . Hyperlipidemia   . Hypertension    Current Outpatient Prescriptions on File Prior to Visit  Medication Sig Dispense Refill  . calcium carbonate (OS-CAL) 600 MG TABS Take 600 mg by mouth daily.        . Calcium Carbonate-Simethicone (MAALOX ADVANCED MAX ST) 1000-60 MG CHEW Chew 1 tablet by mouth as needed.        . Carboxymethylcellul-Glycerin 0.5-0.9 % SOLN Apply to eye as needed.        . indapamide (LOZOL) 2.5 MG tablet Take 1 tablet (2.5 mg total) by mouth every morning.  30 tablet  6  . KLOR-CON 10 10 MEQ CR tablet TAKE 2 TABLETS BY MOUTH EVERY DAY  60 tablet  3  . lisinopril (PRINIVIL,ZESTRIL) 20 MG tablet Take 20 mg by mouth daily.        .  metoprolol tartrate (LOPRESSOR) 25 MG tablet Take 0.5 tablets (12.5 mg total) by mouth 2 (two) times daily.  30 tablet  6  . nitroGLYCERIN (NITROSTAT) 0.4 MG SL tablet Place 1 tablet (0.4 mg total) under the tongue every 5 (five) minutes as needed.  90 tablet  2  . nystatin 100000 UNITS vaginal tablet Place 1 tablet vaginally at bedtime.        . pravastatin (PRAVACHOL) 20 MG tablet Take 1 tablet (20 mg total) by mouth daily.  90 tablet  1  . warfarin (COUMADIN) 1 MG tablet Take 1 tablet (1 mg total) by mouth as directed.  30 tablet  3  . warfarin (COUMADIN) 4 MG tablet Take 1 tablet (4 mg total) by mouth daily.  30 tablet  3    Review of Systems  Constitutional: Negative for fever, chills and unexpected weight change.  HENT: Negative for hearing loss, ear pain, nosebleeds, congestion, sore throat, facial swelling, rhinorrhea, sneezing, mouth sores, trouble swallowing, neck pain, neck stiffness, voice change, postnasal drip, sinus pressure, tinnitus and ear discharge.   Eyes: Negative for pain, discharge, redness and visual disturbance.  Respiratory: Negative for cough, chest tightness, shortness of breath, wheezing and stridor.  Cardiovascular: Negative for chest pain, palpitations and leg swelling.  Musculoskeletal: Negative for myalgias and arthralgias.  Skin: Negative for color change and rash.  Neurological: Positive for headaches. Negative for dizziness, weakness and light-headedness.  Hematological: Negative for adenopathy.  BP 150/74  Pulse 66  Temp(Src) 98.4 F (36.9 C) (Oral)  Wt 131 lb (59.421 kg)  SpO2 99%      Objective:   Physical Exam  Constitutional: She is oriented to person, place, and time. She appears well-developed and well-nourished.  HENT:  Mouth/Throat: Oropharynx is clear and moist.  Eyes: EOM are normal. Pupils are equal, round, and reactive to light. No scleral icterus.  Neck: Normal range of motion. Neck supple. No JVD present. No thyromegaly present.    Cardiovascular: Normal rate, regular rhythm, normal heart sounds and intact distal pulses.   Pulmonary/Chest: Effort normal and breath sounds normal.  Abdominal: Soft. Bowel sounds are normal. She exhibits no mass. There is no tenderness.  Musculoskeletal: Normal range of motion. She exhibits no edema.  Lymphadenopathy:    She has no cervical adenopathy.  Neurological: She is alert and oriented to person, place, and time. A cranial nerve deficit is present.       persisent hoarsenes  Skin: Skin is warm and dry.  Psychiatric: Her speech is normal and behavior is normal. Thought content normal. Her mood appears anxious. Cognition and memory are normal. She expresses impulsivity.      Assessment & Plan:  Possible tooth abscess on left , will start clindamycin   And doxycyline and get her to see her dentist for x rays..  Memory loss; vascular dementia likely given history of CVAs.  No role for Aricept.   Cognitive deficits She scored 21 on a MMSE exam adminstered recently by a Home Health RN .  We discussed the diagnosis of memoyr disorder/dementia and I reassured her that the etiology of hers was vascular, not Alzheimers.  She is having no trouble at home currently taking her medicatiosn since she uses a pill box.    Tooth abscess Suggested by recent onset of pain .  Ear likes fine.  Will treat with with antibiotics and hve her see her dentist ASAP.    CVA (cerebral infarction) Left parietal, with nearly full recovery of decifits in right hand.  Continue coumadin, asa, and statin .    Updated Medication List Outpatient Encounter Prescriptions as of 12/30/2011  Medication Sig Dispense Refill  . aspirin 325 MG tablet Take 325 mg by mouth daily.      . calcium carbonate (OS-CAL) 600 MG TABS Take 600 mg by mouth daily.        . Calcium Carbonate-Simethicone (MAALOX ADVANCED MAX ST) 1000-60 MG CHEW Chew 1 tablet by mouth as needed.        . Carboxymethylcellul-Glycerin 0.5-0.9 % SOLN Apply  to eye as needed.        . indapamide (LOZOL) 2.5 MG tablet Take 1 tablet (2.5 mg total) by mouth every morning.  30 tablet  6  . isosorbide mononitrate (IMDUR) 30 MG 24 hr tablet Take 30 mg by mouth daily.      Marland Kitchen KLOR-CON 10 10 MEQ CR tablet TAKE 2 TABLETS BY MOUTH EVERY DAY  60 tablet  3  . lisinopril (PRINIVIL,ZESTRIL) 20 MG tablet Take 20 mg by mouth daily.        . metoprolol tartrate (LOPRESSOR) 25 MG tablet Take 0.5 tablets (12.5 mg total) by mouth 2 (two) times daily.  30 tablet  6  . nitroGLYCERIN (NITROSTAT) 0.4 MG SL tablet Place 1 tablet (0.4 mg total) under the tongue every 5 (five) minutes as needed.  90 tablet  2  . nystatin 100000 UNITS vaginal tablet Place 1 tablet vaginally at bedtime.        . pravastatin (PRAVACHOL) 20 MG tablet Take 1 tablet (20 mg total) by mouth daily.  90 tablet  1  . warfarin (COUMADIN) 1 MG tablet Take 1 tablet (1 mg total) by mouth as directed.  30 tablet  3  . warfarin (COUMADIN) 4 MG tablet Take 1 tablet (4 mg total) by mouth daily.  30 tablet  3  . doxycycline (VIBRA-TABS) 100 MG tablet Take 1 tablet (100 mg total) by mouth 2 (two) times daily.  14 tablet  0  . metroNIDAZOLE (FLAGYL) 500 MG tablet Take 1 tablet (500 mg total) by mouth 3 (three) times daily.  21 tablet  0  . DISCONTD: isosorbide mononitrate (IMDUR) 60 MG 24 hr tablet Take 1 tablet (60 mg total) by mouth daily.  30 tablet  6

## 2011-12-30 NOTE — Patient Instructions (Signed)
We are starting two antibiotics to cover any infection that might be in your molar:    Doxycyline 100 mg twice daily with breakfast and dinner  Metronidazole 500 mg three times daily with breakfast lunch and dinner  Please skip your coumadin dose  On Friday and Saturday because of the antibiotics and resume your usual dose on Sunday

## 2011-12-31 DIAGNOSIS — F0150 Vascular dementia without behavioral disturbance: Secondary | ICD-10-CM | POA: Insufficient documentation

## 2011-12-31 DIAGNOSIS — K047 Periapical abscess without sinus: Secondary | ICD-10-CM | POA: Insufficient documentation

## 2011-12-31 DIAGNOSIS — Z8673 Personal history of transient ischemic attack (TIA), and cerebral infarction without residual deficits: Secondary | ICD-10-CM | POA: Insufficient documentation

## 2011-12-31 NOTE — Assessment & Plan Note (Signed)
Left parietal, with nearly full recovery of decifits in right hand.  Continue coumadin, asa, and statin .

## 2011-12-31 NOTE — Assessment & Plan Note (Signed)
Suggested by recent onset of pain .  Ear likes fine.  Will treat with with antibiotics and hve her see her dentist ASAP.

## 2011-12-31 NOTE — Assessment & Plan Note (Signed)
She scored 21 on a MMSE exam adminstered recently by a Home Health RN .  We discussed the diagnosis of memoyr disorder/dementia and I reassured her that the etiology of hers was vascular, not Alzheimers.  She is having no trouble at home currently taking her medicatiosn since she uses a pill box.

## 2012-01-06 ENCOUNTER — Telehealth: Payer: Self-pay | Admitting: Internal Medicine

## 2012-01-06 ENCOUNTER — Encounter: Payer: Self-pay | Admitting: Internal Medicine

## 2012-01-06 NOTE — Telephone Encounter (Signed)
Tried calling patient, but no one answered and there was no way to leave a message. Will try again tomorrow.

## 2012-01-06 NOTE — Telephone Encounter (Signed)
Her coumadin level is just fine.  Continue current dose. And repeat in one month

## 2012-01-07 NOTE — Telephone Encounter (Signed)
Patient notified

## 2012-01-18 ENCOUNTER — Encounter: Payer: Self-pay | Admitting: Internal Medicine

## 2012-01-23 ENCOUNTER — Other Ambulatory Visit: Payer: Self-pay | Admitting: Internal Medicine

## 2012-01-26 ENCOUNTER — Encounter: Payer: Self-pay | Admitting: Internal Medicine

## 2012-01-27 ENCOUNTER — Telehealth: Payer: Self-pay | Admitting: Internal Medicine

## 2012-01-27 NOTE — Telephone Encounter (Signed)
Has she missed any doses of coumadin lately, there is no coumadin in her system according to her recent PT/INr check.  Please find ou talso if she is using a pill box for weekly meds

## 2012-01-27 NOTE — Telephone Encounter (Signed)
Spoke with patient.  She didn't have any coumadin all last week because she had black diarrhea and her urine looked kind of red, so she felt like she must have had a bleed somewhere.  She says these things slowly cleared up so she started back on the coumadin last night, 4 mg's, and she will continue that.  Says she does use a pill box every week for her meds- day and night.  She has appt to see you on 2/12.

## 2012-01-27 NOTE — Telephone Encounter (Signed)
Called patient, no answer, will try again later.

## 2012-01-27 NOTE — Telephone Encounter (Signed)
Good grief!  We have 45 yr olds going to the ER for dizziness, and my 3 yr olds don't even call when they're having a GI Bleed!.  Please ask her to come by for a CBC tomorrow morning so I can see if she needs to be admitted.

## 2012-01-28 ENCOUNTER — Other Ambulatory Visit (INDEPENDENT_AMBULATORY_CARE_PROVIDER_SITE_OTHER): Payer: 59 | Admitting: *Deleted

## 2012-01-28 DIAGNOSIS — K921 Melena: Secondary | ICD-10-CM

## 2012-01-28 LAB — CBC WITH DIFFERENTIAL/PLATELET
Eosinophils Absolute: 0.1 10*3/uL (ref 0.0–0.7)
Eosinophils Relative: 1.7 % (ref 0.0–5.0)
Lymphocytes Relative: 27 % (ref 12.0–46.0)
MCHC: 34.1 g/dL (ref 30.0–36.0)
MCV: 99.6 fl (ref 78.0–100.0)
Monocytes Absolute: 0.4 10*3/uL (ref 0.1–1.0)
Neutrophils Relative %: 63.2 % (ref 43.0–77.0)
Platelets: 205 10*3/uL (ref 150.0–400.0)
WBC: 6 10*3/uL (ref 4.5–10.5)

## 2012-01-28 NOTE — Telephone Encounter (Signed)
Lab appt made for this morning.

## 2012-02-02 ENCOUNTER — Encounter: Payer: Self-pay | Admitting: Internal Medicine

## 2012-02-02 ENCOUNTER — Ambulatory Visit (INDEPENDENT_AMBULATORY_CARE_PROVIDER_SITE_OTHER): Payer: 59 | Admitting: Internal Medicine

## 2012-02-02 DIAGNOSIS — I639 Cerebral infarction, unspecified: Secondary | ICD-10-CM

## 2012-02-02 DIAGNOSIS — I6350 Cerebral infarction due to unspecified occlusion or stenosis of unspecified cerebral artery: Secondary | ICD-10-CM

## 2012-02-02 DIAGNOSIS — K921 Melena: Secondary | ICD-10-CM | POA: Insufficient documentation

## 2012-02-02 DIAGNOSIS — R319 Hematuria, unspecified: Secondary | ICD-10-CM | POA: Insufficient documentation

## 2012-02-02 NOTE — Progress Notes (Signed)
Subjective:    Patient ID: Latoya Merritt, female    DOB: December 21, 1920, 78 y.o.   MRN: [id number]  HPI  Latoya Merritt is an 78 year old white female with a history of atrial fibrillation and recent embolic CVA who was asked to come in today due to a recent suspension of her Coumadin. Patient self suspended her Coumadin when she noticed melanotic stools but did not call the office. She is also also been taking a full-strength aspirin since her last stroke since she had the stroke while therapeutic on her Coumadin. She has no history of abdominal pain nausea vomiting or diarrhea. She also noted blood in her urine and thought that was due to an antibiotic that she had taken recently.  She has completed her home PT for stroke and has regained full use of her right hand. She is requesting resumption of her driving privileges.. Past Medical History  Diagnosis Date  . Coronary artery disease   . Atrial fibrillation   . Polyp, stomach   . Colon polyp 2008  . Sensorineural hearing loss 2006    sudden, left ear  . DVT (deep venous thrombosis) May 2007  . History of DVT of lower extremity May 2007  . Hyperlipidemia   . Hypertension    Current Outpatient Prescriptions on File Prior to Visit  Medication Sig Dispense Refill  . aspirin 325 MG tablet Take 325 mg by mouth daily.      . calcium carbonate (OS-CAL) 600 MG TABS Take 600 mg by mouth daily.        . Calcium Carbonate-Simethicone (MAALOX ADVANCED MAX ST) 1000-60 MG CHEW Chew 1 tablet by mouth as needed.        . Carboxymethylcellul-Glycerin 0.5-0.9 % SOLN Apply to eye as needed.        . indapamide (LOZOL) 2.5 MG tablet Take 1 tablet (2.5 mg total) by mouth every morning.  30 tablet  6  . isosorbide mononitrate (IMDUR) 30 MG 24 hr tablet Take 30 mg by mouth daily.      Marland Kitchen KLOR-CON 10 10 MEQ tablet TAKE 2 TABLETS BY MOUTH EVERY DAY  60 tablet  3  . lisinopril (PRINIVIL,ZESTRIL) 20 MG tablet Take 20 mg by mouth daily.        . metoprolol tartrate  (LOPRESSOR) 25 MG tablet Take 0.5 tablets (12.5 mg total) by mouth 2 (two) times daily.  30 tablet  6  . nitroGLYCERIN (NITROSTAT) 0.4 MG SL tablet Place 1 tablet (0.4 mg total) under the tongue every 5 (five) minutes as needed.  90 tablet  2  . nystatin 100000 UNITS vaginal tablet Place 1 tablet vaginally at bedtime.        . pravastatin (PRAVACHOL) 20 MG tablet Take 1 tablet (20 mg total) by mouth daily.  90 tablet  1  . warfarin (COUMADIN) 4 MG tablet Take 1 tablet (4 mg total) by mouth daily.  30 tablet  3  . warfarin (COUMADIN) 1 MG tablet Take 1 tablet (1 mg total) by mouth as directed.  30 tablet  3     Review of Systems  Constitutional: Negative for fever, chills and unexpected weight change.  HENT: Negative for hearing loss, ear pain, nosebleeds, congestion, sore throat, facial swelling, rhinorrhea, sneezing, mouth sores, trouble swallowing, neck pain, neck stiffness, voice change, postnasal drip, sinus pressure, tinnitus and ear discharge.   Eyes: Negative for pain, discharge, redness and visual disturbance.  Respiratory: Negative for cough, chest tightness, shortness of breath, wheezing and  stridor.   Cardiovascular: Negative for chest pain, palpitations and leg swelling.  Musculoskeletal: Negative for myalgias and arthralgias.  Skin: Negative for color change and rash.  Neurological: Negative for dizziness, weakness, light-headedness and headaches.  Hematological: Negative for adenopathy.       Objective:   Physical Exam  Constitutional: She is oriented to person, place, and time. She appears well-developed and well-nourished.  HENT:  Mouth/Throat: Oropharynx is clear and moist.  Eyes: EOM are normal. Pupils are equal, round, and reactive to light. No scleral icterus.  Neck: Normal range of motion. Neck supple. No JVD present. No thyromegaly present.  Cardiovascular: Normal rate, regular rhythm, normal heart sounds and intact distal pulses.   Pulmonary/Chest: Effort normal  and breath sounds normal.  Abdominal: Soft. Bowel sounds are normal. She exhibits no mass. There is no tenderness.  Musculoskeletal: Normal range of motion. She exhibits no edema.  Lymphadenopathy:    She has no cervical adenopathy.  Neurological: She is alert and oriented to person, place, and time.  Skin: Skin is warm and dry.  Psychiatric: She has a normal mood and affect.          Assessment & Plan:

## 2012-02-02 NOTE — Patient Instructions (Addendum)
Do not use resume aspirin  Or coumadin until you hear from me.     If your blood test is positive we will send you to GI for evaluation.  If it is negative we will resume your coumadin and I will have repeat your own stool test at home.   You may resume driving during the day

## 2012-02-03 ENCOUNTER — Encounter: Payer: Self-pay | Admitting: *Deleted

## 2012-02-03 DIAGNOSIS — K921 Melena: Secondary | ICD-10-CM | POA: Insufficient documentation

## 2012-02-03 NOTE — Assessment & Plan Note (Signed)
She was unable to provide urine today for analysis but was sent home with a sterile container.

## 2012-02-03 NOTE — Assessment & Plan Note (Signed)
Her history by patient. Stool was brown by exam today and was sent for fecal occult blood testing since this is not available immediately. I have suspended her Coumadin and her aspirin indefinitely. If her strep test is negative we will cautiously resume her Coumadin but not her aspirin. She will repeat the fecal occult blood test once the Coumadin has been resumed and if it is positive she will be sent to GI for evaluation of her current stool test is positive she will be sent to GI for evaluation.

## 2012-02-03 NOTE — Assessment & Plan Note (Signed)
Recent embolic stroke with right hand weakness while therapeutic on Coumadin. She has recovered nicely from her neurologic deficits. Aspirin was added at time of her hospitalization but was discontinued today given her recent history of melanotic stools.

## 2012-02-04 NOTE — Progress Notes (Signed)
Addended by: Melody Comas L on: 02/04/2012 11:07 AM   Modules accepted: Orders

## 2012-02-05 ENCOUNTER — Telehealth: Payer: Self-pay | Admitting: *Deleted

## 2012-02-05 ENCOUNTER — Other Ambulatory Visit: Payer: 59

## 2012-02-05 DIAGNOSIS — Z1211 Encounter for screening for malignant neoplasm of colon: Secondary | ICD-10-CM

## 2012-02-05 LAB — URINALYSIS, ROUTINE W REFLEX MICROSCOPIC
Leukocytes, UA: NEGATIVE
Nitrite: NEGATIVE
Specific Gravity, Urine: 1.014 (ref 1.005–1.030)
pH: 7.5 (ref 5.0–8.0)

## 2012-02-05 LAB — FECAL OCCULT BLOOD, IMMUNOCHEMICAL: Fecal Occult Bld: NEGATIVE

## 2012-02-05 NOTE — Telephone Encounter (Signed)
3 weeks

## 2012-02-05 NOTE — Telephone Encounter (Signed)
Advised patient of lab results, she is asking when does she need to have protime repeated.

## 2012-02-08 NOTE — Telephone Encounter (Signed)
Advised pt

## 2012-02-16 ENCOUNTER — Other Ambulatory Visit: Payer: Self-pay | Admitting: Internal Medicine

## 2012-02-16 ENCOUNTER — Other Ambulatory Visit: Payer: Self-pay | Admitting: Cardiovascular Disease

## 2012-02-18 ENCOUNTER — Encounter: Payer: Self-pay | Admitting: Internal Medicine

## 2012-02-26 ENCOUNTER — Encounter: Payer: Self-pay | Admitting: Internal Medicine

## 2012-02-29 LAB — PROTIME-INR

## 2012-03-02 ENCOUNTER — Telehealth: Payer: Self-pay | Admitting: Internal Medicine

## 2012-03-02 NOTE — Telephone Encounter (Signed)
Patient notified. Chart updated.

## 2012-03-02 NOTE — Telephone Encounter (Signed)
If she has 1 mg tablets available, ask her to increase her coumadin dose to 5 mg on Mond Wed and Fridays.  Continue 4 mg all other days.  Repeat in 2 weeks

## 2012-03-02 NOTE — Telephone Encounter (Signed)
Her inr was a little low at 1.7,  Is she taking 4 mg daily?

## 2012-03-02 NOTE — Telephone Encounter (Signed)
Notified patient, she is taking 4 mg every day.

## 2012-03-11 ENCOUNTER — Encounter: Payer: Self-pay | Admitting: Internal Medicine

## 2012-03-30 ENCOUNTER — Telehealth: Payer: Self-pay | Admitting: Internal Medicine

## 2012-03-30 NOTE — Telephone Encounter (Signed)
Has Latoya Merritt stopped her coumadin  Her level was very very low

## 2012-03-30 NOTE — Telephone Encounter (Signed)
Ok, have her resuem and recheck PT/INR in one week

## 2012-03-30 NOTE — Telephone Encounter (Signed)
Left message asking patient to return my call.

## 2012-03-30 NOTE — Telephone Encounter (Signed)
Patient stated she has missed a few days of coumadin because she had to go to the dentist.  And then she forgot to take it a few nights.

## 2012-03-31 NOTE — Telephone Encounter (Signed)
Patient notified

## 2012-04-05 ENCOUNTER — Encounter: Payer: Self-pay | Admitting: Internal Medicine

## 2012-04-08 LAB — PROTIME-INR

## 2012-04-13 ENCOUNTER — Telehealth: Payer: Self-pay | Admitting: Internal Medicine

## 2012-04-13 NOTE — Telephone Encounter (Signed)
Patient notified

## 2012-04-13 NOTE — Telephone Encounter (Signed)
Left message asking patient to return my call.

## 2012-04-13 NOTE — Telephone Encounter (Signed)
Coumadin level is therapeutic,  Continue current regimen and repeat PT/INR in one month 

## 2012-04-20 ENCOUNTER — Encounter: Payer: Self-pay | Admitting: Internal Medicine

## 2012-05-09 ENCOUNTER — Telehealth: Payer: Self-pay | Admitting: Internal Medicine

## 2012-05-09 DIAGNOSIS — Z7901 Long term (current) use of anticoagulants: Secondary | ICD-10-CM

## 2012-05-09 NOTE — Telephone Encounter (Signed)
Patient notified as instructed by telephone. Pt presently taking Coumadin 4 mg everyday except takes Coumadin 5 mg on Monday, Wednesday and Friday.

## 2012-05-09 NOTE — Telephone Encounter (Signed)
Coumadin level is low.  Please confirm current  regimen so I can adjust  

## 2012-05-09 NOTE — Telephone Encounter (Signed)
Thank you.  Ask her to increase dose to 5 mg daily startign tonight and repeat PT/INR in 2 weeks.  Please have pt/INr entered through [facility] so I get it through [facility] via the [facility] interface

## 2012-05-09 NOTE — Telephone Encounter (Signed)
Patient notified. Lab order entered for [facility].

## 2012-05-19 ENCOUNTER — Other Ambulatory Visit: Payer: Self-pay | Admitting: Internal Medicine

## 2012-05-26 ENCOUNTER — Telehealth: Payer: Self-pay | Admitting: Internal Medicine

## 2012-05-26 NOTE — Telephone Encounter (Signed)
Coumadin level is therapeutic,  Continue current regimen and repeat PT/INR in one month 

## 2012-05-27 NOTE — Telephone Encounter (Signed)
Patient notified

## 2012-05-27 NOTE — Telephone Encounter (Signed)
I tired calling patient but no voicemail has been set up yet.  I will try again.

## 2012-05-31 ENCOUNTER — Encounter: Payer: Self-pay | Admitting: Internal Medicine

## 2012-05-31 ENCOUNTER — Telehealth: Payer: Self-pay | Admitting: Internal Medicine

## 2012-05-31 NOTE — Telephone Encounter (Signed)
Patient called in and states that her dentist wants to know what kind of coumadin patient is taking and what her levels are.  The dentist doesn't want to clean her teeth until they find out what she is on she states that Dr Adrienne Mocha on [address] office will call us and she is giving [place] permission to talk with them.  She states that they told her she would have to come off of warfin a couple of days before her cleaning.

## 2012-06-02 NOTE — Telephone Encounter (Signed)
Will wait for their office to call.

## 2012-06-13 ENCOUNTER — Encounter: Payer: Self-pay | Admitting: Internal Medicine

## 2012-06-14 ENCOUNTER — Encounter: Payer: Self-pay | Admitting: Internal Medicine

## 2012-06-14 ENCOUNTER — Ambulatory Visit (INDEPENDENT_AMBULATORY_CARE_PROVIDER_SITE_OTHER): Payer: 59 | Admitting: Internal Medicine

## 2012-06-14 VITALS — BP 126/70 | HR 58 | Temp 98.2°F | Resp 16 | Wt 136.0 lb

## 2012-06-14 DIAGNOSIS — S40269A Insect bite (nonvenomous) of unspecified shoulder, initial encounter: Secondary | ICD-10-CM | POA: Insufficient documentation

## 2012-06-14 DIAGNOSIS — T148XXA Other injury of unspecified body region, initial encounter: Secondary | ICD-10-CM

## 2012-06-14 MED ORDER — PREDNISONE (PAK) 10 MG PO TABS: ORAL_TABLET | ORAL | Status: AC

## 2012-06-14 MED ORDER — DOXYCYCLINE HYCLATE 100 MG PO TABS: 100.0000 mg | ORAL_TABLET | Freq: Two times a day (BID) | ORAL | Status: AC

## 2012-06-14 NOTE — Progress Notes (Signed)
Patient ID: Latoya Merritt, female   DOB: December 27, 1919, 78 y.o.   MRN: [id number]  Patient Active Problem List  Diagnosis  . HYPERLIPIDEMIA-MIXED  . ATRIAL FIBRILLATION  . CHEST PAIN  . ABNORMAL CV (STRESS) TEST  . HTN (hypertension)  . History of DVT of lower extremity  . Hyperlipidemia  . Hypertension  . Colon polyp  . DVT (deep venous thrombosis)  . Neck pain  . Cognitive deficits  . Tooth abscess  . CVA (cerebral infarction)  . Hematuria  . Melena  . Hematochezia  . Insect bite of shoulder with local reaction    Subjective:  CC:   Chief Complaint  Patient presents with  . Tick Removal    HPI:   Latoya Merritt a 78 y.o. female who presents with ick bites first one found on  right inner thigh a few weeks ago.  She has removed the entire tick and the redness is resolving.  Now her right shoulder is covered in whelps and pruritic, red. Contact in yard bc she has been clearing some limbs,  Has been using sterid cream without results.      Past Medical History  Diagnosis Date  . Coronary artery disease   . Atrial fibrillation   . Polyp, stomach   . Colon polyp 2008  . Sensorineural hearing loss 2006    sudden, left ear  . DVT (deep venous thrombosis) May 2007  . History of DVT of lower extremity May 2007  . Hyperlipidemia   . Hypertension     Past Surgical History  Procedure Date  . Laser surgery for varicose veins   . Nasal polyp surgery 2008    Dr. Jenne Campus, has been horase ever since  . Breast surgery 1972    mastectomies, presumed due to breast CA  . Abdominal hysterectomy 1965  . Appendectomy 1950  . Tonsillectomy 1943  . Laminectomy 1975  . Thyroid surgery 1977    nodule removed         The following portions of the patient's history were reviewed and updated as appropriate: Allergies, current medications, and problem list.    Review of Systems:   12 Pt  review of systems was negative except those addressed in the HPI,     History    Social History  . Marital Status: Married    Spouse Name: N/A    Number of Children: N/A  . Years of Education: N/A   Occupational History  . Not on file.   Social History Main Topics  . Smoking status: Never Smoker   . Smokeless tobacco: Never Used  . Alcohol Use: 4.2 oz/week    7 Glasses of wine per week     glass a wine at bedtime  . Drug Use: No  . Sexually Active: Not on file   Other Topics Concern  . Not on file   Social History Narrative  . No narrative on file    Objective:  BP 126/70  Pulse 58  Temp 98.2 F (36.8 C) (Oral)  Resp 16  Wt 136 lb (61.689 kg)  SpO2 97%  General appearance: alert, cooperative and appears stated age Ears: normal TM's and external ear canals both ears Throat: lips, mucosa, and tongue normal; teeth and gums normal Neck: no adenopathy, no carotid bruit, supple, symmetrical, trachea midline and thyroid not enlarged, symmetric, no tenderness/mass/nodules Back: symmetric, no curvature. ROM normal. No CVA tenderness. Lungs: clear to auscultation bilaterally Heart: regular rate and rhythm, S1, S2  normal, no murmur, click, rub or gallop Abdomen: soft, non-tender; bowel sounds normal; no masses,  no organomegaly Pulses: 2+ and symmetric Skin: Skin color, texture, turgor normal. No rashes or lesions Lymph nodes: Cervical, supraclavicular, and axillary nodes normal.  Assessment and Plan:  Insect bite of shoulder with local reaction Her shoulder appears to have several insect bitess with localized nonnecrotic reaction .  given recent tick bit will treat with doxycycline and prednisoen taper.    Updated Medication List Outpatient Encounter Prescriptions as of 06/14/2012  Medication Sig Dispense Refill  . calcium carbonate (OS-CAL) 600 MG TABS Take 600 mg by mouth daily.        . Calcium Carbonate-Simethicone (MAALOX ADVANCED MAX ST) 1000-60 MG CHEW Chew 1 tablet by mouth as needed.        . Carboxymethylcellul-Glycerin 0.5-0.9 % SOLN  Apply to eye as needed.        . indapamide (LOZOL) 2.5 MG tablet Take 1 tablet (2.5 mg total) by mouth every morning.  30 tablet  6  . isosorbide mononitrate (IMDUR) 60 MG 24 hr tablet TAKE 1 TABLET BY MOUTH EVERY DAY  30 tablet  5  . KLOR-CON 10 10 MEQ tablet TAKE 2 TABLETS BY MOUTH EVERY DAY  60 tablet  3  . lisinopril (PRINIVIL,ZESTRIL) 20 MG tablet TAKE 1 TABLET BY MOUTH EVERY DAY  30 tablet  6  . metoprolol tartrate (LOPRESSOR) 25 MG tablet Take 0.5 tablets (12.5 mg total) by mouth 2 (two) times daily.  30 tablet  6  . nitroGLYCERIN (NITROSTAT) 0.4 MG SL tablet Place 1 tablet (0.4 mg total) under the tongue every 5 (five) minutes as needed.  90 tablet  2  . nystatin 100000 UNITS vaginal tablet Place 1 tablet vaginally at bedtime.        . pravastatin (PRAVACHOL) 20 MG tablet Take 1 tablet (20 mg total) by mouth daily.  90 tablet  1  . warfarin (COUMADIN) 5 MG tablet Take 5 mg by mouth daily.      Marland Kitchen doxycycline (VIBRA-TABS) 100 MG tablet Take 1 tablet (100 mg total) by mouth 2 (two) times daily. Take with food to prevent nausea  14 tablet  0  . predniSONE (STERAPRED UNI-PAK) 10 MG tablet 6 tablets on Day 1 , then reduce by 1 tablet daily until gone  21 tablet  0  . DISCONTD: aspirin 325 MG tablet Take 325 mg by mouth daily.      Marland Kitchen DISCONTD: isosorbide mononitrate (IMDUR) 30 MG 24 hr tablet Take 30 mg by mouth daily.      Marland Kitchen DISCONTD: warfarin (COUMADIN) 1 MG tablet Take 1 tablet (1 mg total) by mouth as directed.  30 tablet  3  . DISCONTD: warfarin (COUMADIN) 4 MG tablet Take 4 mg by mouth daily. Take 5 mg on Mon, Wednesday, Friday, and 4 mg on all other days.         No orders of the defined types were placed in this encounter.    Return in about 3 months (around 09/14/2012).

## 2012-06-14 NOTE — Patient Instructions (Addendum)
I am prescribing doxycyline for 7 days  (take with food !) and a prednisone taper  Please skip 2 days of your coumadin starting on June 28 and 29th,  Then resume regular dose and get your level checked on July 3

## 2012-06-16 ENCOUNTER — Encounter: Payer: Self-pay | Admitting: Internal Medicine

## 2012-06-16 NOTE — Assessment & Plan Note (Addendum)
Her shoulder appears to have several insect bitess with localized nonnecrotic reaction .  given recent tick bit will treat with doxycycline and prednisoen taper.

## 2012-06-22 LAB — PROTIME-INR

## 2012-06-24 ENCOUNTER — Telehealth: Payer: Self-pay | Admitting: Internal Medicine

## 2012-06-24 NOTE — Telephone Encounter (Signed)
Yes, patient was prescribed two antibiotics by you for the tick bite and you asked her to not take it for two days.  She is taking 5 mg of coumadin daily.

## 2012-06-24 NOTE — Telephone Encounter (Signed)
Patient notified

## 2012-06-24 NOTE — Telephone Encounter (Signed)
Coumadin level is low,  Has she forgotten to take it?  Or started any new medications?  Confirm current dose so i can amend it.

## 2012-06-24 NOTE — Telephone Encounter (Signed)
Ok, just have her resume her regular dose and repeat PT/INR in 2 weeks

## 2012-07-06 ENCOUNTER — Other Ambulatory Visit: Payer: Self-pay | Admitting: Internal Medicine

## 2012-07-07 ENCOUNTER — Encounter: Payer: Self-pay | Admitting: Internal Medicine

## 2012-07-08 ENCOUNTER — Ambulatory Visit (INDEPENDENT_AMBULATORY_CARE_PROVIDER_SITE_OTHER): Payer: 59 | Admitting: Internal Medicine

## 2012-07-08 ENCOUNTER — Encounter: Payer: Self-pay | Admitting: Internal Medicine

## 2012-07-08 VITALS — BP 110/68 | HR 68 | Temp 98.4°F | Wt 134.0 lb

## 2012-07-08 DIAGNOSIS — I6350 Cerebral infarction due to unspecified occlusion or stenosis of unspecified cerebral artery: Secondary | ICD-10-CM

## 2012-07-08 DIAGNOSIS — W010XXA Fall on same level from slipping, tripping and stumbling without subsequent striking against object, initial encounter: Secondary | ICD-10-CM

## 2012-07-08 DIAGNOSIS — I639 Cerebral infarction, unspecified: Secondary | ICD-10-CM

## 2012-07-08 NOTE — Progress Notes (Signed)
Patient ID: Latoya Merritt, female   DOB: Feb 14, 1920, 78 y.o.   MRN: [id number] Patient Active Problem List  Diagnosis  . HYPERLIPIDEMIA-MIXED  . ATRIAL FIBRILLATION  . CHEST PAIN  . ABNORMAL CV (STRESS) TEST  . HTN (hypertension)  . History of DVT of lower extremity  . Hyperlipidemia  . Hypertension  . Colon polyp  . DVT (deep venous thrombosis)  . Neck pain  . Cognitive deficits  . Tooth abscess  . CVA (cerebral infarction)  . Hematuria  . Melena  . Hematochezia  . Insect bite of shoulder with local reaction  . Fall on same level from slipping, tripping or stumbling    Subjective:  CC:   Chief Complaint  Patient presents with  . Fall    fall x 1 week ago, some cuts and bruises, also hoarseness in the evenings    HPI:   Latoya Merritt a 78 y.o. female who presents  Past Medical History  Diagnosis Date  . Coronary artery disease   . Atrial fibrillation   . Polyp, stomach   . Colon polyp 2008  . Sensorineural hearing loss 2006    sudden, left ear  . DVT (deep venous thrombosis) May 2007  . History of DVT of lower extremity May 2007  . Hyperlipidemia   . Hypertension     Past Surgical History  Procedure Date  . Laser surgery for varicose veins   . Nasal polyp surgery 2008    Dr. Jenne Campus, has been horase ever since  . Breast surgery 1972    mastectomies, presumed due to breast CA  . Abdominal hysterectomy 1965  . Appendectomy 1950  . Tonsillectomy 1943  . Laminectomy 1975  . Thyroid surgery 1977    nodule removed         The following portions of the patient's history were reviewed and updated as appropriate: Allergies, current medications, and problem list.    Review of Systems: Review of Systems  Constitutional: Negative for fever and malaise/fatigue.  HENT: Negative for ear pain and neck pain.   Eyes: Negative for blurred vision and double vision.  Respiratory: Negative for shortness of breath.   Cardiovascular: Negative for chest  pain and leg swelling.  Gastrointestinal: Negative for abdominal pain and blood in stool.  Genitourinary: Negative for dysuria and flank pain.  Musculoskeletal: Positive for falls. Negative for back pain and joint pain.  Skin: Negative for rash.  Neurological: Negative for dizziness, sensory change, focal weakness and headaches.  Endo/Heme/Allergies: Bruises/bleeds easily.  Psychiatric/Behavioral: Negative for depression and memory loss.      History   Social History  . Marital Status: Married    Spouse Name: N/A    Number of Children: N/A  . Years of Education: N/A   Occupational History  . Not on file.   Social History Main Topics  . Smoking status: Never Smoker   . Smokeless tobacco: Never Used  . Alcohol Use: 4.2 oz/week    7 Glasses of wine per week     glass a wine at bedtime  . Drug Use: No  . Sexually Active: Not on file   Other Topics Concern  . Not on file   Social History Narrative  . No narrative on file    Objective:  BP 110/68  Pulse 68  Temp 98.4 F (36.9 C) (Oral)  Wt 134 lb (60.782 kg)  SpO2 98%  General appearance: alert, cooperative and appears stated age Neck: no adenopathy, no carotid bruit,  supple, symmetrical, trachea midline and thyroid not enlarged, symmetric, no tenderness/mass/nodules Back: symmetric,  ROM normal. No CVA or spine tenderness. Lungs: clear to auscultation bilaterally Heart: irreg irreg , S1, S2 normal, no murmur, click, rub or gallop Abdomen: soft, non-tender; bowel sounds normal; no masses,  no organomegaly Pulses: 2+ and symmetric Skin:  Several superficial abrasions and cugts,  No signs of infection .  Moderate sized hematoma on right buttock, no ischila or hip tenderness. Lymph nodes: Cervical, supraclavicular, and axillary nodes normal. Neurological:  Grossly nonfocal.  Gait normal.   Assessment and Plan:  CVA (cerebral infarction) History of, in the setting of chronic Coumadin use for medication of stroke  risk due to atrial fibrillation. Recent fall was due to loss of balance. She does not have a gait disorder upon my exam she continues to engage in unsafe behavior for somebody her age on Coumadin and I have warned her again that the risk of cerebral hemorrhage due to falling falling and hitting her head while on Coumadin is increased to more often she continues to do her on outside yard work and Scientist, physiological.. She does wear a Lifeline.  Fall on same level from slipping, tripping or stumbling Patient fell as a result of bending over to clean debris from the ventilator opening his at the foundation of her house. She fell backwards narrowly missing hitting her head. She did call for help using a Lifeline and several Environmental consultant and policeman arrived to find her unable to get up but conscious. She has some minor cuts and bruises on her elbows legs and buttocks, all of which were examined today. She continues to live alone and prefers to do her own yard work and house repairs despite having a son who lives near by despite knowing that the risk of a subarachnoid intercerebral hemorrhage because of her Coumadin therapy is great. She is considered competent.   Updated Medication List Outpatient Encounter Prescriptions as of 07/08/2012  Medication Sig Dispense Refill  . calcium carbonate (OS-CAL) 600 MG TABS Take 600 mg by mouth daily.        . Calcium Carbonate-Simethicone (MAALOX ADVANCED MAX ST) 1000-60 MG CHEW Chew 1 tablet by mouth as needed.        . Carboxymethylcellul-Glycerin 0.5-0.9 % SOLN Apply to eye as needed.        . doxycycline (VIBRA-TABS) 100 MG tablet Take 100 mg by mouth 2 (two) times daily.       . indapamide (LOZOL) 2.5 MG tablet Take 1 tablet (2.5 mg total) by mouth every morning.  30 tablet  6  . ipratropium (ATROVENT) 0.06 % nasal spray Place 2 sprays into the nose every morning.       . isosorbide mononitrate (IMDUR) 60 MG 24 hr tablet TAKE 1 TABLET BY MOUTH EVERY DAY  30 tablet  5    . KLOR-CON 10 10 MEQ tablet TAKE 2 TABLETS BY MOUTH EVERY DAY  60 tablet  3  . lisinopril (PRINIVIL,ZESTRIL) 20 MG tablet TAKE 1 TABLET BY MOUTH EVERY DAY  30 tablet  6  . metoprolol tartrate (LOPRESSOR) 25 MG tablet Take 0.5 tablets (12.5 mg total) by mouth 2 (two) times daily.  30 tablet  6  . nitroGLYCERIN (NITROSTAT) 0.4 MG SL tablet Place 1 tablet (0.4 mg total) under the tongue every 5 (five) minutes as needed.  90 tablet  2  . nystatin 100000 UNITS vaginal tablet Place 1 tablet vaginally at bedtime.        Marland Kitchen  pravastatin (PRAVACHOL) 20 MG tablet Take 1 tablet (20 mg total) by mouth daily.  90 tablet  1  . warfarin (COUMADIN) 4 MG tablet TAKE 1 TABLET BY MOUTH DAILY AS DIRECTED  60 tablet  2  . DISCONTD: warfarin (COUMADIN) 5 MG tablet Take 5 mg by mouth daily.

## 2012-07-10 DIAGNOSIS — W010XXA Fall on same level from slipping, tripping and stumbling without subsequent striking against object, initial encounter: Secondary | ICD-10-CM | POA: Insufficient documentation

## 2012-07-10 NOTE — Assessment & Plan Note (Signed)
History of, in the setting of chronic Coumadin use for medication of stroke risk due to atrial fibrillation. Recent fall was due to loss of balance. She does not have a gait disorder upon my exam she continues to engage in unsafe behavior for somebody her age on Coumadin and I have warned her again that the risk of cerebral hemorrhage due to falling falling and hitting her head while on Coumadin is increased to more often she continues to do her on outside yard work and Scientist, physiological.. She does wear a Lifeline.

## 2012-07-10 NOTE — Assessment & Plan Note (Signed)
Patient fell as a result of bending over to clean debris from the ventilator opening his at the foundation of her house. She fell backwards narrowly missing hitting her head. She did call for help using a Lifeline and several Environmental consultant and policeman arrived to find her unable to get up but conscious. She has some minor cuts and bruises on her elbows legs and buttocks, all of which were examined today. She continues to live alone and prefers to do her own yard work and house repairs despite having a son who lives near by despite knowing that the risk of a subarachnoid intercerebral hemorrhage because of her Coumadin therapy is great. She is considered competent.

## 2012-07-12 ENCOUNTER — Telehealth: Payer: Self-pay | Admitting: Internal Medicine

## 2012-07-12 NOTE — Telephone Encounter (Signed)
Coumadin level is stable ,  Continue current dose.

## 2012-07-12 NOTE — Telephone Encounter (Signed)
Pt called to check on protime labs she had done on Friday Please advise her med dosage

## 2012-07-12 NOTE — Telephone Encounter (Signed)
Coumadin level is therapeutic,  Continue current regimen and repeat PT/INR in one month 

## 2012-07-13 NOTE — Telephone Encounter (Signed)
Left message asking patient to return my call.

## 2012-07-13 NOTE — Telephone Encounter (Signed)
Patient returned call and was notified.

## 2012-07-27 ENCOUNTER — Encounter: Payer: Self-pay | Admitting: Internal Medicine

## 2012-08-01 ENCOUNTER — Other Ambulatory Visit: Payer: Self-pay | Admitting: Internal Medicine

## 2012-08-01 NOTE — Telephone Encounter (Signed)
Coumadin level is therapeutic,  Continue current regimen and repeat PT/INR in one month 

## 2012-08-01 NOTE — Telephone Encounter (Signed)
Left detailed message notifying patient.

## 2012-08-08 ENCOUNTER — Encounter: Payer: Self-pay | Admitting: Internal Medicine

## 2012-08-18 ENCOUNTER — Other Ambulatory Visit: Payer: Self-pay | Admitting: Cardiovascular Disease

## 2012-08-18 NOTE — Telephone Encounter (Signed)
Refilled Isosorbide. 

## 2012-08-24 LAB — PROTIME-INR

## 2012-08-25 ENCOUNTER — Telehealth: Payer: Self-pay | Admitting: Internal Medicine

## 2012-08-25 NOTE — Telephone Encounter (Signed)
Left message on cell phone voicemail for Latoya Merritt to return call.

## 2012-08-25 NOTE — Telephone Encounter (Signed)
Coumadin level was low at 1.1.  Has pt been taking her coumadin 4mg  daily? If yes, please increase to 5mg  daily, repeat INR 1 week.

## 2012-08-26 NOTE — Telephone Encounter (Signed)
Patient advised, she stated that she is already taking 5 mg every day, should she increase to 6mg ?

## 2012-08-26 NOTE — Telephone Encounter (Signed)
Patient advised as instructed via telephone. 

## 2012-08-26 NOTE — Telephone Encounter (Signed)
Yes, increase to 6mg  daily. Repeat INR 1 week

## 2012-08-27 ENCOUNTER — Emergency Department: Payer: Self-pay | Admitting: Emergency Medicine

## 2012-08-27 LAB — COMPREHENSIVE METABOLIC PANEL
Albumin: 3.3 g/dL — ABNORMAL LOW (ref 3.4–5.0)
Alkaline Phosphatase: 50 U/L (ref 50–136)
Anion Gap: 10 (ref 7–16)
Bilirubin,Total: 0.5 mg/dL (ref 0.2–1.0)
Calcium, Total: 8.6 mg/dL (ref 8.5–10.1)
Chloride: 100 mmol/L (ref 98–107)
Co2: 24 mmol/L (ref 21–32)
Creatinine: 1.02 mg/dL (ref 0.60–1.30)
EGFR (Non-African Amer.): 48 — ABNORMAL LOW
Glucose: 84 mg/dL (ref 65–99)
Osmolality: 267 (ref 275–301)
Potassium: 3.4 mmol/L — ABNORMAL LOW (ref 3.5–5.1)
SGOT(AST): 34 U/L (ref 15–37)
Sodium: 134 mmol/L — ABNORMAL LOW (ref 136–145)
Total Protein: 6.7 g/dL (ref 6.4–8.2)

## 2012-08-27 LAB — TROPONIN I: Troponin-I: <0.02 ng/mL

## 2012-08-27 LAB — CBC
HCT: 34.2 % — ABNORMAL LOW (ref 35.0–47.0)
HGB: 11.9 g/dL — ABNORMAL LOW (ref 12.0–16.0)
MCH: 35.5 pg — ABNORMAL HIGH (ref 26.0–34.0)
MCHC: 34.8 g/dL (ref 32.0–36.0)
MCV: 102 fL — ABNORMAL HIGH (ref 80–100)

## 2012-08-27 LAB — PROTIME-INR
INR: 1.1
Prothrombin Time: 14.2 secs (ref 11.5–14.7)

## 2012-08-29 ENCOUNTER — Encounter: Payer: Self-pay | Admitting: Internal Medicine

## 2012-09-01 ENCOUNTER — Encounter: Payer: Self-pay | Admitting: Internal Medicine

## 2012-09-02 ENCOUNTER — Telehealth: Payer: Self-pay | Admitting: *Deleted

## 2012-09-02 NOTE — Telephone Encounter (Signed)
Called patient to hear his concerns.  Patient has been observed by multiple family members and neighbors to be drinking alcohol to the point  Of losing her balance.  She had a recent fall while inebriated and was treated in the [facility] ER.    Please call patient to schedule an "ER followup" .  Do not mention to patient that her son called me or anything about the alcohol. It will need to be a 30 minute visit

## 2012-09-02 NOTE — Telephone Encounter (Signed)
Latoya Merritt - son - calls because he has concerns with the patient drinking wine.  She mother has had some falls and has gone to the hospital.  He is out of state in [place] and has a power of medical attorney that he can have faxed.  The patients wife has also noticed the consumption of wine.  His mother has changed the locks on the door so the neighbor can not check on her. Please advise? He will send a copy of power of attorney and a letter to Dr Darrick Huntsman by Monday. His number is [phone]

## 2012-09-06 NOTE — Telephone Encounter (Signed)
She was called by Latoya Merritt on Sept 5( see log) and told to increase dose to 5 mg daily .  Her repeat INR one week later was `1.6, which is better.  I do not want it to be higher since she is having falls and at increased risk of having a devastating intracranial bleed if she hits her head. Continue 5 mg daily

## 2012-09-06 NOTE — Telephone Encounter (Signed)
Left message asking patient to return my call.

## 2012-09-06 NOTE — Telephone Encounter (Signed)
Patient came in the office and scheduled an appt for a 30 min visit.  She is wanting her lab results from her last PT/INR.  It was scanned in on 9/4.  Please advise.

## 2012-09-08 ENCOUNTER — Encounter: Payer: Self-pay | Admitting: Internal Medicine

## 2012-09-09 NOTE — Telephone Encounter (Signed)
I called patient again, and advised that she is supposed to be taking 5 mg of coumadin everyday and make sure she makes the appt with Dr. Darrick Huntsman on October 1st.

## 2012-09-14 ENCOUNTER — Telehealth: Payer: Self-pay | Admitting: Internal Medicine

## 2012-09-14 ENCOUNTER — Other Ambulatory Visit: Payer: Self-pay | Admitting: Internal Medicine

## 2012-09-14 LAB — PROTIME-INR

## 2012-09-14 NOTE — Telephone Encounter (Signed)
Sent to call a nurse

## 2012-09-15 ENCOUNTER — Telehealth: Payer: Self-pay | Admitting: Internal Medicine

## 2012-09-15 NOTE — Telephone Encounter (Signed)
Patient notified.  She stated she only drinks one glass of wine at noon per Dr. Melina Schools order.  She stated for heart health Dr. Darrick Huntsman told her and the cardiologist is ok with her drinking a glass of wine.  She stated she will stop drinking wine because she thinks it  Is elevating her coumadin level as well.

## 2012-09-15 NOTE — Telephone Encounter (Signed)
Left message asking patient to return my call.

## 2012-09-15 NOTE — Telephone Encounter (Signed)
Her Coumadin level has increased a lot since last time. There should have been no change in her Coumadin dosage. She should still be on 5 mg daily. If she is drinking any alcohol I want her to stop any use of alcohol as this as this could be elevating her Coumadin level.

## 2012-09-16 ENCOUNTER — Ambulatory Visit: Payer: 59 | Admitting: Internal Medicine

## 2012-09-19 ENCOUNTER — Encounter: Payer: Self-pay | Admitting: Internal Medicine

## 2012-09-20 ENCOUNTER — Encounter: Payer: Self-pay | Admitting: Internal Medicine

## 2012-09-20 ENCOUNTER — Ambulatory Visit (INDEPENDENT_AMBULATORY_CARE_PROVIDER_SITE_OTHER): Payer: 59 | Admitting: Internal Medicine

## 2012-09-20 VITALS — HR 54 | Temp 97.7°F | Resp 16 | Ht 66.0 in | Wt 136.0 lb

## 2012-09-20 DIAGNOSIS — D689 Coagulation defect, unspecified: Secondary | ICD-10-CM

## 2012-09-20 DIAGNOSIS — T50901A Poisoning by unspecified drugs, medicaments and biological substances, accidental (unintentional), initial encounter: Secondary | ICD-10-CM

## 2012-09-20 DIAGNOSIS — M25519 Pain in unspecified shoulder: Secondary | ICD-10-CM

## 2012-09-20 DIAGNOSIS — M25512 Pain in left shoulder: Secondary | ICD-10-CM

## 2012-09-20 DIAGNOSIS — W010XXA Fall on same level from slipping, tripping and stumbling without subsequent striking against object, initial encounter: Secondary | ICD-10-CM

## 2012-09-20 DIAGNOSIS — T45515A Adverse effect of anticoagulants, initial encounter: Secondary | ICD-10-CM

## 2012-09-20 DIAGNOSIS — T50904A Poisoning by unspecified drugs, medicaments and biological substances, undetermined, initial encounter: Secondary | ICD-10-CM

## 2012-09-20 NOTE — Patient Instructions (Addendum)
We are rechecking your coumadin level next Monday..  We gave you your flu shot today  I and Dr. Mariah Milling advise you to stop drinking any wine or alcohol because it is affecting your coumadin level too much.   I will refer you to Dr. Katrinka Blazing to help your shoulder, and arrange for PT as well.    We are stopping your pravastatin   You can use a heating pad for 15 minutes to help warm up the shoulder before you use it

## 2012-09-20 NOTE — Progress Notes (Signed)
Patient ID: Latoya Merritt, female   DOB: [birth year], 78 y.o.   MRN: [id number]  Patient Active Problem List  Diagnosis  . HYPERLIPIDEMIA-MIXED  . ATRIAL FIBRILLATION  . CHEST PAIN  . ABNORMAL CV (STRESS) TEST  . HTN (hypertension)  . History of DVT of lower extremity  . Hyperlipidemia  . Hypertension  . Colon polyp  . DVT (deep venous thrombosis)  . Neck pain  . Cognitive deficits  . Tooth abscess  . History of CVA (cerebrovascular accident)  . Fall on same level from slipping, tripping or stumbling  . Warfarin-induced coagulopathy  . Shoulder pain, left    Subjective:  CC:   Chief Complaint  Patient presents with  . Shoulder Pain    since fall on 08/27/12; taking 5mg  of coumadin    HPI:   Latoya Merritt a 78 y.o. female who presents for followup on recent ER visit for fall. Patient was treated in ER several weeks ago after falling at home. A confidential discussion with her son who has power of attorney revealed that patient has been drinking alcohol on a daily basis because her doctors told her to do so.  He has received calls from several neighbors who have noted slurred speech and erratic behavior. Her son has tried to discuss her use of alcohol  with her in the past .   Her chief complaint today is Left shoulder pain. It has been  present since her fall on September 7. The shoulder was not x-rayed and she was not having pain in the shoulder at that time. She notes pain with forward flexion and forced abduction. She cannot internally her arm due to pain. There was no bruising of the shoulder at the time of the fall. She does have a large ecchymosis  on her forearm that she attributes to recent IV. She did have a head CT  while in the ER on September 7. which was negative for hemorrhage. Her alcohol level was not checked at that time but her previous your visit in 2011 noted an elevated alcohol level.  Her recent PT/INR was elevated and I instructed her to stop drinking  alcohol in several days ago. She states that she has followed instructions. She does not report any anxiety or tremors. She is well appearing today.   she did miss an appt with Dr Wyn Quaker  On Sept 24th.     Past Medical History  Diagnosis Date  . Coronary artery disease   . Atrial fibrillation   . Polyp, stomach   . Colon polyp 2008  . Sensorineural hearing loss 2006    sudden, left ear  . DVT (deep venous thrombosis) May 2007  . History of DVT of lower extremity May 2007  . Hyperlipidemia   . Hypertension     Past Surgical History  Procedure Date  . Laser surgery for varicose veins   . Nasal polyp surgery 2008    Dr. Jenne Campus, has been horase ever since  . Breast surgery 1972    mastectomies, presumed due to breast CA  . Abdominal hysterectomy 1965  . Appendectomy 1950  . Tonsillectomy 1943  . Laminectomy 1975  . Thyroid surgery 1977    nodule removed         The following portions of the patient's history were reviewed and updated as appropriate: Allergies, current medications, and problem list.    Review of Systems:   12 Pt  review of systems was negative except those addressed  in the HPI,     History   Social History  . Marital Status: Married    Spouse Name: N/A    Number of Children: N/A  . Years of Education: N/A   Occupational History  . Not on file.   Social History Main Topics  . Smoking status: Never Smoker   . Smokeless tobacco: Never Used  . Alcohol Use: 4.2 oz/week    7 Glasses of wine per week     glass a wine at bedtime  . Drug Use: No  . Sexually Active: Not on file   Other Topics Concern  . Not on file   Social History Narrative  . No narrative on file    Objective:  Pulse 54  Temp 97.7 F (36.5 C) (Oral)  Resp 16  Ht 5\' 6"  (1.676 m)  Wt 136 lb (61.689 kg)  BMI 21.95 kg/m2  SpO2 96%  General appearance: alert, cooperative and appears stated age Ears: normal TM's and external ear canals both ears Throat: lips, mucosa,  and tongue normal; teeth and gums normal Neck: no adenopathy, no carotid bruit, supple, symmetrical, trachea midline and thyroid not enlarged, symmetric, no tenderness/mass/nodules Back: symmetric, no curvature. ROM normal. No CVA tenderness. Lungs: clear to auscultation bilaterally Heart: regular rate and rhythm, S1, S2 normal, no murmur, click, rub or gallop Abdomen: soft, non-tender; bowel sounds normal; no masses,  no organomegaly Pulses: 2+ and symmetric Skin: Scattered large ecchymoses on both arms.  MSK: Left shoulder with restricted range of motion regarding internal rotation forward flexion and abduction due to pain. No point tenderness noted. Lymph nodes: Cervical, supraclavicular, and axillary nodes normal.  Assessment and Plan:  Fall on same level from slipping, tripping or stumbling Likely due to intoxication from use of alcohol. I have avoided characterizing her alcohol use is excessive. Given her prior unsuccessful discussions with her son. I have recommended that she abstain from all alcohol indefinitely on the presumption that her body is not processing it well and it is also elevating her pro time.  Warfarin-induced coagulopathy Her pro time was was elevated at last check. At that point she had no recent changes in medications. I recommended to her then and again today that she stopped drinking any alcohol as this was elevating her INR. She is receptive to this idea and states she was only drinking alcohol because Dr. Mariah Milling and I told her to.  Shoulder pain, left She is no signs of fracture but may have adhesive capsulitis versus early frozen shoulder due to pain from fall. I have her recommended physical therapy and evaluation by Reita Chard. Referral to [facility] orthopedics done.   Updated Medication List Outpatient Encounter Prescriptions as of 09/20/2012  Medication Sig Dispense Refill  . calcium carbonate (OS-CAL) 600 MG TABS Take 600 mg by mouth daily.        .  Calcium Carbonate-Simethicone (MAALOX ADVANCED MAX ST) 1000-60 MG CHEW Chew 1 tablet by mouth as needed.        . Carboxymethylcellul-Glycerin 0.5-0.9 % SOLN Apply to eye as needed.       . indapamide (LOZOL) 2.5 MG tablet TAKE 1 TABLET BY MOUTH EVERY MORNING  30 tablet  5  . ipratropium (ATROVENT) 0.06 % nasal spray Place 2 sprays into the nose every morning.       . isosorbide mononitrate (IMDUR) 60 MG 24 hr tablet TAKE 1 TABLET BY MOUTH EVERY DAY  30 tablet  5  . KLOR-CON 10 10 MEQ  tablet TAKE 2 TABLETS BY MOUTH EVERY DAY  60 tablet  3  . lisinopril (PRINIVIL,ZESTRIL) 20 MG tablet TAKE 1 TABLET BY MOUTH EVERY DAY  30 tablet  6  . metoprolol tartrate (LOPRESSOR) 25 MG tablet Take 0.5 tablets (12.5 mg total) by mouth 2 (two) times daily.  30 tablet  6  . nitroGLYCERIN (NITROSTAT) 0.4 MG SL tablet Place 1 tablet (0.4 mg total) under the tongue every 5 (five) minutes as needed.  90 tablet  2  . pravastatin (PRAVACHOL) 20 MG tablet Take 20 mg by mouth daily.      . pravastatin (PRAVACHOL) 20 MG tablet Take 20 mg by mouth daily.      . pravastatin (PRAVACHOL) 20 MG tablet Take 20 mg by mouth daily.      . pravastatin (PRAVACHOL) 20 MG tablet Take 20 mg by mouth daily.      . pravastatin (PRAVACHOL) 20 MG tablet Take 20 mg by mouth daily.      . pravastatin (PRAVACHOL) 20 MG tablet Take 20 mg by mouth daily.      . pravastatin (PRAVACHOL) 20 MG tablet Take 20 mg by mouth daily.      . pravastatin (PRAVACHOL) 20 MG tablet Take 20 mg by mouth daily.      . pravastatin (PRAVACHOL) 20 MG tablet Take 20 mg by mouth daily.      . pravastatin (PRAVACHOL) 20 MG tablet Take 20 mg by mouth daily.      . pravastatin (PRAVACHOL) 20 MG tablet Take 20 mg by mouth daily.      . pravastatin (PRAVACHOL) 20 MG tablet Take 20 mg by mouth daily.      . pravastatin (PRAVACHOL) 20 MG tablet Take 20 mg by mouth daily.      . pravastatin (PRAVACHOL) 20 MG tablet Take 20 mg by mouth daily.      . pravastatin (PRAVACHOL)  20 MG tablet Take 20 mg by mouth daily.      . pravastatin (PRAVACHOL) 20 MG tablet Take 20 mg by mouth daily.      . pravastatin (PRAVACHOL) 20 MG tablet Take 20 mg by mouth daily.      . pravastatin (PRAVACHOL) 20 MG tablet Take 20 mg by mouth daily.      . pravastatin (PRAVACHOL) 20 MG tablet Take 20 mg by mouth daily.      . pravastatin (PRAVACHOL) 20 MG tablet Take 20 mg by mouth daily.      . pravastatin (PRAVACHOL) 20 MG tablet Take 20 mg by mouth daily.      . pravastatin (PRAVACHOL) 20 MG tablet Take 20 mg by mouth daily.      . pravastatin (PRAVACHOL) 20 MG tablet Take 20 mg by mouth daily.      . pravastatin (PRAVACHOL) 20 MG tablet Take 20 mg by mouth daily.      . pravastatin (PRAVACHOL) 20 MG tablet Take 20 mg by mouth daily.      . pravastatin (PRAVACHOL) 20 MG tablet Take 20 mg by mouth daily.      . pravastatin (PRAVACHOL) 20 MG tablet Take 20 mg by mouth daily.      . pravastatin (PRAVACHOL) 20 MG tablet Take 20 mg by mouth daily.      . pravastatin (PRAVACHOL) 20 MG tablet Take 20 mg by mouth daily.      . pravastatin (PRAVACHOL) 20 MG tablet Take 20 mg by mouth daily.      . pravastatin (PRAVACHOL)  20 MG tablet Take 20 mg by mouth daily.      . pravastatin (PRAVACHOL) 20 MG tablet Take 20 mg by mouth daily.      Marland Kitchen warfarin (COUMADIN) 4 MG tablet TAKE 1 TABLET BY MOUTH DAILY AS DIRECTED  60 tablet  2      90 tablet  1  . DISCONTD: doxycycline (VIBRA-TABS) 100 MG tablet Take 100 mg by mouth 2 (two) times daily.       Marland Kitchen DISCONTD: nystatin 100000 UNITS vaginal tablet Place 1 tablet vaginally at bedtime.           Orders Placed This Encounter  Procedures  . Ambulatory referral to Orthopedic Surgery  . Ambulatory referral to Physical Therapy    No Follow-up on file.

## 2012-09-21 ENCOUNTER — Encounter: Payer: Self-pay | Admitting: Internal Medicine

## 2012-09-21 DIAGNOSIS — M25512 Pain in left shoulder: Secondary | ICD-10-CM | POA: Insufficient documentation

## 2012-09-21 DIAGNOSIS — D6832 Hemorrhagic disorder due to extrinsic circulating anticoagulants: Secondary | ICD-10-CM | POA: Insufficient documentation

## 2012-09-21 NOTE — Assessment & Plan Note (Signed)
She is no signs of fracture but may have adhesive capsulitis versus early frozen shoulder due to pain from fall. I have her recommended physical therapy and evaluation by Reita Chard. Referral to [facility] orthopedics done.

## 2012-09-21 NOTE — Assessment & Plan Note (Signed)
Her pro time was was elevated at last check. At that point she had no recent changes in medications. I recommended to her then and again today that she stopped drinking any alcohol as this was elevating her INR. She is receptive to this idea and states she was only drinking alcohol because Dr. Mariah Milling and I told her to.

## 2012-09-21 NOTE — Assessment & Plan Note (Signed)
Likely due to intoxication from use of alcohol. I have avoided characterizing her alcohol use is excessive. Given her prior unsuccessful discussions with her son. I have recommended that she abstain from all alcohol indefinitely on the presumption that her body is not processing it well and it is also elevating her pro time.

## 2012-09-23 ENCOUNTER — Ambulatory Visit: Payer: Self-pay | Admitting: Internal Medicine

## 2012-09-24 ENCOUNTER — Emergency Department: Payer: Self-pay | Admitting: Emergency Medicine

## 2012-09-24 LAB — COMPREHENSIVE METABOLIC PANEL
Albumin: 3.7 g/dL (ref 3.4–5.0)
Alkaline Phosphatase: 57 U/L (ref 50–136)
Bilirubin,Total: 1.4 mg/dL — ABNORMAL HIGH (ref 0.2–1.0)
Co2: 28 mmol/L (ref 21–32)
Creatinine: 1.15 mg/dL (ref 0.60–1.30)
EGFR (Non-African Amer.): 42 — ABNORMAL LOW
Glucose: 103 mg/dL — ABNORMAL HIGH (ref 65–99)
Osmolality: 279 (ref 275–301)
Sodium: 139 mmol/L (ref 136–145)

## 2012-09-24 LAB — CBC
HCT: 32.6 % — ABNORMAL LOW (ref 35.0–47.0)
HGB: 11.2 g/dL — ABNORMAL LOW (ref 12.0–16.0)
MCH: 35 pg — ABNORMAL HIGH (ref 26.0–34.0)
MCV: 101 fL — ABNORMAL HIGH (ref 80–100)
Platelet: 165 10*3/uL (ref 150–440)
RBC: 3.22 10*6/uL — ABNORMAL LOW (ref 3.80–5.20)
WBC: 6.8 10*3/uL (ref 3.6–11.0)

## 2012-09-24 LAB — PROTIME-INR: INR: 2.6

## 2012-09-26 ENCOUNTER — Encounter: Payer: Self-pay | Admitting: Internal Medicine

## 2012-09-26 ENCOUNTER — Ambulatory Visit (INDEPENDENT_AMBULATORY_CARE_PROVIDER_SITE_OTHER): Payer: 59 | Admitting: Internal Medicine

## 2012-09-26 VITALS — BP 122/64 | HR 73 | Temp 97.6°F | Ht 66.5 in | Wt 134.0 lb

## 2012-09-26 DIAGNOSIS — D689 Coagulation defect, unspecified: Secondary | ICD-10-CM

## 2012-09-26 DIAGNOSIS — D6832 Hemorrhagic disorder due to extrinsic circulating anticoagulants: Secondary | ICD-10-CM

## 2012-09-26 NOTE — Progress Notes (Signed)
Patient ID: Latoya Merritt, female   DOB: December 18, 1920, 78 y.o.   MRN: [id number] Patient Active Problem List  Diagnosis  . HYPERLIPIDEMIA-MIXED  . ATRIAL FIBRILLATION  . CHEST PAIN  . ABNORMAL CV (STRESS) TEST  . HTN (hypertension)  . History of DVT of lower extremity  . Hyperlipidemia  . Hypertension  . Colon polyp  . DVT (deep venous thrombosis)  . Neck pain  . Cognitive deficits  . Tooth abscess  . History of CVA (cerebrovascular accident)  . Fall on same level from slipping, tripping or stumbling  . Warfarin-induced coagulopathy  . Shoulder pain, left    Subjective:  CC:   Chief Complaint  Patient presents with  . Follow-up    HPI:   Latoya Merritt a 78 y.o. female who presents  With Left arm ecchymosis..  Since her visit last week she developed diffuse bruising covering her entire arm from mid deltoid down, which started after she received her flu hot. Was seen in ER on Satruday night for ecchymosis left shoulder which occurred after she got a flu shot in lef tarm.  dneis a recent fall,  But had fallen in Sept and hd a large ecchymosis of left elbow. Was seen at Urgent Care in the on Wednesday and sent to ER .  Entire arm and chest was x rayed,  ultrasound of left arm done on Friday  because was having trouble using her left thumb due to her swelling. No fractures or blood clots seen.  Coumadin level was still high. The hospital has suspended coumadin .  I have discussed risk of  Continuing coumadin and other anticoagulants.  She insists that she has stopped drinking all alcohol   Past Medical History  Diagnosis Date  . Coronary artery disease   . Atrial fibrillation   . Polyp, stomach   . Colon polyp 2008  . Sensorineural hearing loss 2006    sudden, left ear  . DVT (deep venous thrombosis) May 2007  . History of DVT of lower extremity May 2007  . Hyperlipidemia   . Hypertension     Past Surgical History  Procedure Date  . Laser surgery for varicose veins     . Nasal polyp surgery 2008    Dr. Jenne Campus, has been horase ever since  . Breast surgery 1972    mastectomies, presumed due to breast CA  . Abdominal hysterectomy 1965  . Appendectomy 1950  . Tonsillectomy 1943  . Laminectomy 1975  . Thyroid surgery 1977    nodule removed         The following portions of the patient's history were reviewed and updated as appropriate: Allergies, current medications, and problem list.    Review of Systems:   12 Pt  review of systems was negative except those addressed in the HPI,     History   Social History  . Marital Status: Married    Spouse Name: N/A    Number of Children: N/A  . Years of Education: N/A   Occupational History  . Not on file.   Social History Main Topics  . Smoking status: Never Smoker   . Smokeless tobacco: Never Used  . Alcohol Use: 4.2 oz/week    7 Glasses of wine per week     glass a wine at bedtime  . Drug Use: No  . Sexually Active: Not on file   Other Topics Concern  . Not on file   Social History Narrative  . No  narrative on file    Objective:  BP 122/64  Pulse 73  Temp 97.6 F (36.4 C) (Oral)  Ht 5' 6.5" (1.689 m)  Wt 134 lb (60.782 kg)  BMI 21.30 kg/m2  SpO2 98%  General appearance: alert, cooperative and appears stated age Ears: normal TM's and external ear canals both ears Throat: lips, mucosa, and tongue normal; teeth and gums normal Neck: no adenopathy, no carotid bruit, supple, symmetrical, trachea midline and thyroid not enlarged, symmetric, no tenderness/mass/nodules Back: symmetric, no curvature. ROM normal. No CVA tenderness. Lungs: clear to auscultation bilaterally Heart: regular rate and rhythm, S1, S2 normal, no murmur, click, rub or gallop Abdomen: soft, non-tender; bowel sounds normal; no masses,  no organomegaly Pulses: 2+ and symmetric Skin: Skin color, texture, turgor normal. No rashes or lesions Lymph nodes: Cervical, supraclavicular, and axillary nodes  normal.  Assessment and Plan:  Warfarin-induced coagulopathy Her entire left arm is covered in ecchymoses but has no sings of DVT or compartment syndrome.  Coumadin has been suspended.  Alcohol use has been discouraged.  Will discuss alternatives (Xarelto) with Dr. Mariah Milling   Updated Medication List Outpatient Encounter Prescriptions as of 09/26/2012  Medication Sig Dispense Refill  . calcium carbonate (OS-CAL) 600 MG TABS Take 600 mg by mouth daily.        . Calcium Carbonate-Simethicone (MAALOX ADVANCED MAX ST) 1000-60 MG CHEW Chew 1 tablet by mouth as needed.        . Carboxymethylcellul-Glycerin 0.5-0.9 % SOLN Apply to eye as needed.       . indapamide (LOZOL) 2.5 MG tablet TAKE 1 TABLET BY MOUTH EVERY MORNING  30 tablet  5  . ipratropium (ATROVENT) 0.06 % nasal spray Place 2 sprays into the nose every morning.       . isosorbide mononitrate (IMDUR) 60 MG 24 hr tablet TAKE 1 TABLET BY MOUTH EVERY DAY  30 tablet  5  . KLOR-CON 10 10 MEQ tablet TAKE 2 TABLETS BY MOUTH EVERY DAY  60 tablet  3  . lisinopril (PRINIVIL,ZESTRIL) 20 MG tablet TAKE 1 TABLET BY MOUTH EVERY DAY  30 tablet  6  . metoprolol tartrate (LOPRESSOR) 25 MG tablet Take 0.5 tablets (12.5 mg total) by mouth 2 (two) times daily.  30 tablet  6  . nitroGLYCERIN (NITROSTAT) 0.4 MG SL tablet Place 1 tablet (0.4 mg total) under the tongue every 5 (five) minutes as needed.  90 tablet  2  . pravastatin (PRAVACHOL) 20 MG tablet Take 20 mg by mouth daily.      Marland Kitchen warfarin (COUMADIN) 4 MG tablet TAKE 1 TABLET BY MOUTH DAILY AS DIRECTED  60 tablet  2  . DISCONTD: pravastatin (PRAVACHOL) 20 MG tablet Take 20 mg by mouth daily.      Marland Kitchen DISCONTD: pravastatin (PRAVACHOL) 20 MG tablet Take 20 mg by mouth daily.      Marland Kitchen DISCONTD: pravastatin (PRAVACHOL) 20 MG tablet Take 20 mg by mouth daily.      Marland Kitchen DISCONTD: pravastatin (PRAVACHOL) 20 MG tablet Take 20 mg by mouth daily.      Marland Kitchen DISCONTD: pravastatin (PRAVACHOL) 20 MG tablet Take 20 mg by mouth  daily.      Marland Kitchen DISCONTD: pravastatin (PRAVACHOL) 20 MG tablet Take 20 mg by mouth daily.      Marland Kitchen DISCONTD: pravastatin (PRAVACHOL) 20 MG tablet Take 20 mg by mouth daily.      Marland Kitchen DISCONTD: pravastatin (PRAVACHOL) 20 MG tablet Take 20 mg by mouth daily.      Marland Kitchen  DISCONTD: pravastatin (PRAVACHOL) 20 MG tablet Take 20 mg by mouth daily.      Marland Kitchen DISCONTD: pravastatin (PRAVACHOL) 20 MG tablet Take 20 mg by mouth daily.      Marland Kitchen DISCONTD: pravastatin (PRAVACHOL) 20 MG tablet Take 20 mg by mouth daily.      Marland Kitchen DISCONTD: pravastatin (PRAVACHOL) 20 MG tablet Take 20 mg by mouth daily.      Marland Kitchen DISCONTD: pravastatin (PRAVACHOL) 20 MG tablet Take 20 mg by mouth daily.      Marland Kitchen DISCONTD: pravastatin (PRAVACHOL) 20 MG tablet Take 20 mg by mouth daily.      Marland Kitchen DISCONTD: pravastatin (PRAVACHOL) 20 MG tablet Take 20 mg by mouth daily.      Marland Kitchen DISCONTD: pravastatin (PRAVACHOL) 20 MG tablet Take 20 mg by mouth daily.      Marland Kitchen DISCONTD: pravastatin (PRAVACHOL) 20 MG tablet Take 20 mg by mouth daily.      Marland Kitchen DISCONTD: pravastatin (PRAVACHOL) 20 MG tablet Take 20 mg by mouth daily.      Marland Kitchen DISCONTD: pravastatin (PRAVACHOL) 20 MG tablet Take 20 mg by mouth daily.      Marland Kitchen DISCONTD: pravastatin (PRAVACHOL) 20 MG tablet Take 20 mg by mouth daily.      Marland Kitchen DISCONTD: pravastatin (PRAVACHOL) 20 MG tablet Take 20 mg by mouth daily.      Marland Kitchen DISCONTD: pravastatin (PRAVACHOL) 20 MG tablet Take 20 mg by mouth daily.      Marland Kitchen DISCONTD: pravastatin (PRAVACHOL) 20 MG tablet Take 20 mg by mouth daily.      Marland Kitchen DISCONTD: pravastatin (PRAVACHOL) 20 MG tablet Take 20 mg by mouth daily.      Marland Kitchen DISCONTD: pravastatin (PRAVACHOL) 20 MG tablet Take 20 mg by mouth daily.      Marland Kitchen DISCONTD: pravastatin (PRAVACHOL) 20 MG tablet Take 20 mg by mouth daily.      Marland Kitchen DISCONTD: pravastatin (PRAVACHOL) 20 MG tablet Take 20 mg by mouth daily.      Marland Kitchen DISCONTD: pravastatin (PRAVACHOL) 20 MG tablet Take 20 mg by mouth daily.      Marland Kitchen DISCONTD: pravastatin (PRAVACHOL) 20 MG tablet  Take 20 mg by mouth daily.      Marland Kitchen DISCONTD: pravastatin (PRAVACHOL) 20 MG tablet Take 20 mg by mouth daily.      Marland Kitchen DISCONTD: pravastatin (PRAVACHOL) 20 MG tablet Take 20 mg by mouth daily.         No orders of the defined types were placed in this encounter.    No Follow-up on file.

## 2012-09-26 NOTE — Patient Instructions (Addendum)
Do not resume coumadin OR your daily glass of wine.  I will speak to Dr Mariah Milling about an alternative medication to prevent strokes and blood clots

## 2012-09-26 NOTE — Assessment & Plan Note (Signed)
Her entire left arm is covered in ecchymoses but has no sings of DVT or compartment syndrome.  Coumadin has been suspended.  Alcohol use has been discouraged.  Will discuss alternatives (Xarelto) with Dr. Mariah Milling

## 2012-09-29 ENCOUNTER — Telehealth: Payer: Self-pay | Admitting: Internal Medicine

## 2012-09-29 NOTE — Telephone Encounter (Signed)
Called the patient to tell her about her ortho appointment with Dr Reita Chard.  She stated her arm and shoulder were too sore to have anyone touch it.  She went to [facility] Dr Wyn Quaker yesterday and she goes back to see him in 3 weeks.  She says the arm is black and her hand is numb. I will cancel this appointment with Dr Katrinka Blazing and hopefully the patient will call back in a few weeks and will be ready to see ortho and I can make a new appt at that time

## 2012-10-03 ENCOUNTER — Telehealth: Payer: Self-pay | Admitting: Internal Medicine

## 2012-10-03 ENCOUNTER — Emergency Department: Payer: Self-pay | Admitting: Emergency Medicine

## 2012-10-03 LAB — CBC WITH DIFFERENTIAL/PLATELET
Basophil #: 0.1 10*3/uL (ref 0.0–0.1)
Eosinophil #: 0 10*3/uL (ref 0.0–0.7)
Lymphocyte #: 1.7 10*3/uL (ref 1.0–3.6)
MCH: 35 pg — ABNORMAL HIGH (ref 26.0–34.0)
MCV: 102 fL — ABNORMAL HIGH (ref 80–100)
Neutrophil #: 3.4 10*3/uL (ref 1.4–6.5)
Neutrophil %: 60.5 %
Platelet: 251 10*3/uL (ref 150–440)
RDW: 12.4 % (ref 11.5–14.5)

## 2012-10-03 LAB — COMPREHENSIVE METABOLIC PANEL
Albumin: 4 g/dL (ref 3.4–5.0)
Anion Gap: 10 (ref 7–16)
BUN: 27 mg/dL — ABNORMAL HIGH (ref 7–18)
Bilirubin,Total: 1 mg/dL (ref 0.2–1.0)
Chloride: 104 mmol/L (ref 98–107)
Co2: 27 mmol/L (ref 21–32)
Creatinine: 1.33 mg/dL — ABNORMAL HIGH (ref 0.60–1.30)
EGFR (African American): 41 — ABNORMAL LOW
Potassium: 3.7 mmol/L (ref 3.5–5.1)
SGOT(AST): 36 U/L (ref 15–37)
Sodium: 141 mmol/L (ref 136–145)
Total Protein: 7.7 g/dL (ref 6.4–8.2)

## 2012-10-03 LAB — PROTIME-INR: Prothrombin Time: 12.9 secs (ref 11.5–14.7)

## 2012-10-03 NOTE — Telephone Encounter (Signed)
Caller: Foster/Patient; Patient Name: Latoya Merritt; PCP: Duncan Dull (Adults only); Best Callback Phone Number: [phone] Seen in ER after fall onto L side on 08/27/12 and was told to stop taking Coumaden by Dr.Tullo. She last took dose on 09/23/12. She has black bruising all down L side, L arm and L hand and L hand is swollen. Arm still hurts and feels lumpy.  Dr.Tullo was going to start new med prevent blood clots. She can move fingers and arm. She has not gone to see Orthopedist. Seen on 09/29/12 by Vascular PA and told to loosely wrap arm. Triage and care advice per Falls and Arm Injury Protocols and ED advised for "Extremity swelling or limitation of range of motion AND known bleeding disorder OR taking blood thinner,..." she does not want to go back to ER and if cannot be seen in office she is going to go to UC. Please call her and leave message about new med and if she can be seen by Dr. Darrick Huntsman soon.

## 2012-10-03 NOTE — Telephone Encounter (Signed)
There is no new symptom that requires being seen.  Her bruising is gong to take time.  She decided she did not want to see the orthopedist since nothing is broken,  She can go to urgent Care if she wants. Seh is supposed to follow up with Dr. Mariah Milling and he will address the idea of an alternative to coumaidn.  This was dicussed in detail at her last visit.

## 2012-10-04 ENCOUNTER — Telehealth: Payer: Self-pay

## 2012-10-04 LAB — PROTIME-INR

## 2012-10-04 NOTE — Telephone Encounter (Signed)
Pt called to say she was in ER last night for left arm hematoma. She fell 1 month ago and says she has been in and out of ER several times. She has also been seeing Dr. Darrick Huntsman for same issue. She is no longer taking Coumadin and says Dr. Darrick Huntsman mentioned discussing this with Dr. Mariah Milling to see if there is an alternative d/t her increased risk of falls. I explained she should probably be seen by PCP re: hematoma. She says she called their office and was told she cannot be seen today and suggested she call us.  I explained we would just draw lab work to check platelets, etc, which ER has already done (last night). I told her i would discuss with Dr. Mariah Milling and call her back at [phone]. Understanding verb.

## 2012-10-04 NOTE — Telephone Encounter (Signed)
Please see below and advise. thanks 

## 2012-10-04 NOTE — Telephone Encounter (Signed)
Spoke with pt and advised her per Dr Melina Schools instructions. She is still off of her coumadin and will call and set up appt with Dr Mariah Milling. She also mentioned she had an ultrasound and labs yesterday was waiting for results on labs, but for ultrasound she was told that there was just a hematoma under the skin. I advised that the bruising will fade, but it will take time, but to seek treatment if it worsens or spreads.

## 2012-10-05 NOTE — Telephone Encounter (Signed)
She is overdue for clinic visit Not much I can say without seeing her arm Need to discuss anticoagulation

## 2012-10-06 ENCOUNTER — Telehealth: Payer: Self-pay | Admitting: Internal Medicine

## 2012-10-06 NOTE — Telephone Encounter (Signed)
Latoya Merritt spoke with patient today here in the office, Latoya Merritt stated that their is no new symptoms regarding her hand.  The bruising is clearing and patient wanted Dr. Darrick Huntsman to be aware.

## 2012-10-06 NOTE — Telephone Encounter (Signed)
Pt denies dizziness, sob or other symptoms. Her main complaint is left arm pain associated with hematoma. I explained Dr. Mariah Milling would like to look at this (see his note) and also discuss anticoag. Issues.  He has a cancellation for tomm at 1015. Pt coming in tomm at 1015

## 2012-10-06 NOTE — Telephone Encounter (Signed)
Pt came today and wanted to let you know she is seeing dr Mariah Milling tomorrow.  Had nurse speak to ms Rudden  Her had is black/blue

## 2012-10-06 NOTE — Telephone Encounter (Signed)
I called pt to tell her we should see her in office. She says she was just getting dressed and was going to come up here. I explained Dr. Mariah Milling can not see her today but offered to make her an appt.  I then had to place her on hold for an emergency in office. I will call her back.

## 2012-10-07 ENCOUNTER — Ambulatory Visit (INDEPENDENT_AMBULATORY_CARE_PROVIDER_SITE_OTHER): Payer: 59

## 2012-10-07 VITALS — BP 130/70 | HR 56 | Ht 66.0 in | Wt 130.8 lb

## 2012-10-07 DIAGNOSIS — I4891 Unspecified atrial fibrillation: Secondary | ICD-10-CM

## 2012-10-07 NOTE — Patient Instructions (Addendum)
Keep appt as scheduled with Dr. Mariah Milling 10/25/12 I will discuss with Dr. Mariah Milling re: anticoagulation and hematoma and will call you back with plan.

## 2012-10-07 NOTE — Progress Notes (Signed)
Latoya Merritt says she fell 08/27/12 in her home.  She went to ER and CT scan was performed of head since she was on coumadin.  CT scan was negative for bleed. She then f/u with Dr. Darrick Huntsman for flu shot 09/20/12 After flu shot she began to develop a hematoma at left inferior aspect of upper arm Because of recent flu shot, new hematoma and significant bruising on left arm and left ribs, she went back to ER. She had a X ray at that time among other tests She was scheduled for an U/S of left arm 09/23/12 to r/u thrombosis of left arm This was negative Despite negative U/S and continual worsening of left arm hematoma, Latoya Merritt returned back to ER for eval Again, ER MD explained this is just a hematoma and should reabsorb on it's own Dr. Darrick Huntsman advised Latoya Merritt to hold warfarin until she could speak with Dr. Mariah Milling about alternative for anticoagulation. Latoya Merritt called [place] this week,asking to be seen for this reason Worked in today with Dr. Mariah Milling, but his schedule had to be altered  Here for me to assess arm  There is a large hematoma on inferior aspect of left upper arm. It is soft, raised and skin is intact. Some bruising is noted in area There is also significant bruising on left lateral aspect of torso, which is healing, changing from purple to yellow bruising Also, Latoya Merritt's left hand appears very dark purple, all the way to fingertips.  This is concerning for Latoya Merritt.  She has mobility in this hand . Hand is warm, nail beds are pink and capillary refill is <3 seconds. She also has  Strong radial pulse in left hand.  I explained this may be a sign of the hematoma reabsorbing and moving to hand via gravity. Latoya Merritt denies pain in left arm/hand. I told her I would notify Dr. Mariah Milling and will inform her of his plan. Understanding verb.  Latoya Merritt confirms appt with [place] 11/5 and will keep this appt unless she feels she needs to be seen sooner.

## 2012-10-08 ENCOUNTER — Other Ambulatory Visit: Payer: Self-pay | Admitting: Internal Medicine

## 2012-10-14 ENCOUNTER — Ambulatory Visit: Payer: 59 | Admitting: Cardiovascular Disease

## 2012-10-17 ENCOUNTER — Encounter: Payer: Self-pay | Admitting: Internal Medicine

## 2012-10-19 NOTE — Addendum Note (Signed)
Addended by: Antonieta Iba on: 10/19/2012 12:35 AM   Modules accepted: Level of Service

## 2012-10-21 ENCOUNTER — Inpatient Hospital Stay: Payer: Self-pay | Admitting: Internal Medicine

## 2012-10-21 LAB — TROPONIN I: Troponin-I: 0.07 ng/mL — ABNORMAL HIGH

## 2012-10-21 LAB — CBC
HCT: 32.4 % — ABNORMAL LOW (ref 35.0–47.0)
HGB: 11.3 g/dL — ABNORMAL LOW (ref 12.0–16.0)
MCH: 35 pg — ABNORMAL HIGH (ref 26.0–34.0)
MCV: 101 fL — ABNORMAL HIGH (ref 80–100)
RBC: 3.23 10*6/uL — ABNORMAL LOW (ref 3.80–5.20)

## 2012-10-21 LAB — COMPREHENSIVE METABOLIC PANEL
Alkaline Phosphatase: 44 U/L — ABNORMAL LOW (ref 50–136)
BUN: 23 mg/dL — ABNORMAL HIGH (ref 7–18)
Calcium, Total: 9 mg/dL (ref 8.5–10.1)
Chloride: 107 mmol/L (ref 98–107)
Co2: 25 mmol/L (ref 21–32)
EGFR (African American): 46 — ABNORMAL LOW
EGFR (Non-African Amer.): 40 — ABNORMAL LOW
SGOT(AST): 34 U/L (ref 15–37)
SGPT (ALT): 22 U/L (ref 12–78)
Sodium: 142 mmol/L (ref 136–145)
Total Protein: 6.7 g/dL (ref 6.4–8.2)

## 2012-10-21 LAB — URINALYSIS, COMPLETE
Bacteria: NONE SEEN
Bilirubin,UR: NEGATIVE
Glucose,UR: NEGATIVE mg/dL (ref 0–75)
Ketone: NEGATIVE
Nitrite: NEGATIVE
Ph: 6 (ref 4.5–8.0)
Specific Gravity: 1.013 (ref 1.003–1.030)
Squamous Epithelial: <1
WBC UR: 2 /HPF (ref 0–5)

## 2012-10-21 LAB — PROTIME-INR
INR: 0.9
Prothrombin Time: 12.7 secs (ref 11.5–14.7)

## 2012-10-21 LAB — CK TOTAL AND CKMB (NOT AT ARMC)
CK, Total: 59 U/L (ref 21–215)
CK-MB: 1.5 ng/mL (ref 0.5–3.6)

## 2012-10-22 ENCOUNTER — Ambulatory Visit: Payer: Self-pay | Admitting: Neurology

## 2012-10-22 DIAGNOSIS — I059 Rheumatic mitral valve disease, unspecified: Secondary | ICD-10-CM

## 2012-10-22 DIAGNOSIS — I214 Non-ST elevation (NSTEMI) myocardial infarction: Secondary | ICD-10-CM

## 2012-10-22 LAB — BASIC METABOLIC PANEL
Anion Gap: 9 (ref 7–16)
BUN: 16 mg/dL (ref 7–18)
Calcium, Total: 8.7 mg/dL (ref 8.5–10.1)
Co2: 28 mmol/L (ref 21–32)
Creatinine: 1.04 mg/dL (ref 0.60–1.30)
EGFR (African American): 55 — ABNORMAL LOW
EGFR (Non-African Amer.): 47 — ABNORMAL LOW
Potassium: 3.4 mmol/L — ABNORMAL LOW (ref 3.5–5.1)
Sodium: 143 mmol/L (ref 136–145)

## 2012-10-22 LAB — CBC WITH DIFFERENTIAL/PLATELET
Basophil %: 1.1 %
Eosinophil #: 0.1 10*3/uL (ref 0.0–0.7)
HCT: 33.5 % — ABNORMAL LOW (ref 35.0–47.0)
HGB: 11.5 g/dL — ABNORMAL LOW (ref 12.0–16.0)
Lymphocyte %: 37.1 %
MCHC: 34.4 g/dL (ref 32.0–36.0)
Monocyte %: 10.5 %
Neutrophil #: 2.7 10*3/uL (ref 1.4–6.5)
Neutrophil %: 49.8 %
Platelet: 146 10*3/uL — ABNORMAL LOW (ref 150–440)
RBC: 3.28 10*6/uL — ABNORMAL LOW (ref 3.80–5.20)
WBC: 5.4 10*3/uL (ref 3.6–11.0)

## 2012-10-22 LAB — TROPONIN I: Troponin-I: 2.4 ng/mL — ABNORMAL HIGH

## 2012-10-23 LAB — BASIC METABOLIC PANEL
Anion Gap: 10 (ref 7–16)
BUN: 13 mg/dL (ref 7–18)
Chloride: 102 mmol/L (ref 98–107)
EGFR (Non-African Amer.): 50 — ABNORMAL LOW
Glucose: 81 mg/dL (ref 65–99)
Potassium: 3.7 mmol/L (ref 3.5–5.1)
Sodium: 138 mmol/L (ref 136–145)

## 2012-10-23 LAB — LIPID PANEL
Cholesterol: 197 mg/dL (ref 0–200)
HDL Cholesterol: 61 mg/dL — ABNORMAL HIGH (ref 40–60)
Ldl Cholesterol, Calc: 109 mg/dL — ABNORMAL HIGH (ref 0–100)
Triglycerides: 137 mg/dL (ref 0–200)
VLDL Cholesterol, Calc: 27 mg/dL (ref 5–40)

## 2012-10-24 ENCOUNTER — Telehealth: Payer: Self-pay | Admitting: Internal Medicine

## 2012-10-24 ENCOUNTER — Telehealth: Payer: Self-pay

## 2012-10-24 NOTE — Telephone Encounter (Signed)
[facility] wants someone to call them in reference to the patient's physical therapy after she is seen in the office.

## 2012-10-24 NOTE — Telephone Encounter (Signed)
Spoke with Stanton Kidney with [facility] and she wanted to know if Dr. Mariah Milling would be willing to sign orders for pt to get Physical Therapy and nursing. Pt discharged from [facility] and is following up on 10/25/2012. She mentioned that Tullo refused to sign orders and she figured that since pt was coming in tomorrow that possibly Dr. Mariah Milling would. She is aware that its possible after Dr. Mariah Milling see's her that he may but we would have to wait until after she is seen. She mentioned that she will follow up with [place] tomorrow after her visit.

## 2012-10-24 NOTE — Telephone Encounter (Signed)
Message copied by Marcelle Overlie on Mon Oct 24, 2012 12:20 PM ------      Message from: Iverson Alamin C      Created: Mon Oct 24, 2012 12:11 PM       Tcm- Discharged yesterday from [facility]      Pt has appointment 10/25/2012 at 2:00.      Not sure if that would count due to pt having appoint already scheduled. 1 yr f/u

## 2012-10-24 NOTE — Telephone Encounter (Signed)
TCM call Pt d/c from [facility] yesterday 11/3 for subacute CVA Says she is feeling well today, although she did have 1 episode of chest tightness when she got up to walk around home. This resolved after sitting down to rest and she feels fine at this time She has appt with Dr. Mariah Milling tomm for hosp f/u She confirms she has all meds she needs and will start coumadin 4 mg daily tonight She has questions about PT/INR management which she will discuss with Dr. Mariah Milling tomm. She denies weakness, sob or left arm/hand numbness

## 2012-10-24 NOTE — Telephone Encounter (Signed)
Patient needs hospital follow up appt .  Discharged 11/3   By 11/10. Please call  thanks

## 2012-10-24 NOTE — Telephone Encounter (Signed)
deba called about ms Uram Pt discharged from [facility] over the weekend.[facility] is requesting nursing and physical therapy  Will dr Darrick Huntsman sign order

## 2012-10-25 ENCOUNTER — Ambulatory Visit (INDEPENDENT_AMBULATORY_CARE_PROVIDER_SITE_OTHER): Payer: Medicare Other | Admitting: Cardiovascular Disease

## 2012-10-25 ENCOUNTER — Encounter: Payer: Self-pay | Admitting: Cardiovascular Disease

## 2012-10-25 VITALS — BP 140/56 | HR 69 | Ht 66.0 in | Wt 128.5 lb

## 2012-10-25 DIAGNOSIS — R079 Chest pain, unspecified: Secondary | ICD-10-CM

## 2012-10-25 DIAGNOSIS — I10 Essential (primary) hypertension: Secondary | ICD-10-CM

## 2012-10-25 DIAGNOSIS — I4891 Unspecified atrial fibrillation: Secondary | ICD-10-CM

## 2012-10-25 DIAGNOSIS — R0602 Shortness of breath: Secondary | ICD-10-CM

## 2012-10-25 DIAGNOSIS — D6832 Hemorrhagic disorder due to extrinsic circulating anticoagulants: Secondary | ICD-10-CM

## 2012-10-25 DIAGNOSIS — W010XXA Fall on same level from slipping, tripping and stumbling without subsequent striking against object, initial encounter: Secondary | ICD-10-CM

## 2012-10-25 DIAGNOSIS — T45515A Adverse effect of anticoagulants, initial encounter: Secondary | ICD-10-CM

## 2012-10-25 NOTE — Assessment & Plan Note (Signed)
Blood pressure is well controlled on today's visit. No changes made to the medications. 

## 2012-10-25 NOTE — Assessment & Plan Note (Signed)
Rate is well controlled. Given severe biatrial enlargement, recent stroke, would recommend anticoagulation. Per the notes, Dr. Kirke Corin and Dr. Hilton Sinclair  Did discuss xarelto and pradaxa. This was not started secondary to cost concerns. She is back on warfarin 4 mg daily with INR checked tomorrow, followup with Dr. Darrick Huntsman in several days.

## 2012-10-25 NOTE — Progress Notes (Signed)
Patient ID: Latoya Merritt, female    DOB: 09/26/1920, 78 y.o.   MRN: [id number]  HPI Comments: Latoya Merritt is a very pleasant 78 year old woman with a history of atrial fibrillation on warfarin (permenant), , hyperlipidemia with notes indicating elevated liver function tests on statins, history of DVT, hypertension, bilateral mastectomy, presenting for routine followup She is a patient of Dr. Darrick Huntsman. echocardiogram and stress test in 2010   She presents today after recent admission to the hospital for left-sided weakness, diagnosis of CVA. She reports that on November 1 she was admitted to the hospital. She developed acute onset of left-sided weakness while walking to the bathroom. She had difficulty getting off the toilet and went back to her bed. She slipped out of her bed and was finally able to call for help using her telephone. She had significant bruising on her arm, left side of her neck.  She did have a non-ST elevation MI with peak troponin 2.5 felt secondary to demand ischemia. No cardiac workup was performed apart from echocardiogram  In the hospital total cholesterol 197, LDL 109 Echocardiogram showed normal ejection fraction, severe biatrial enlargement, moderate MR, moderate TR, right ventricular systolic pressure mildly elevated at 30-40 mm of mercury  Coumadin had been held recently after recent left arm hematoma following flu shot. She has had falls recently and her gait is poor. She was discharged to home and consult was placed for physical therapy but has not been executed yet.  They would not send anyone to her house to work with her until a physician signed the orders.    Stress test dated June 2010 suggests mild ischemia in the inferolateral region, normal systolic function, ejection fraction 70%. Normal wall motion. This was a pharmacologic study.   EKG shows atrial fibrillation with ventricular rate 69 beats per minute, nonspecific ST abnormality    Outpatient  Encounter Prescriptions as of 10/25/2012  Medication Sig Dispense Refill  . calcium carbonate (OS-CAL) 600 MG TABS Take 600 mg by mouth daily.        . Calcium Carbonate-Simethicone (MAALOX ADVANCED MAX ST) 1000-60 MG CHEW Chew 1 tablet by mouth as needed.        . isosorbide mononitrate (IMDUR) 60 MG 24 hr tablet TAKE 1 TABLET BY MOUTH EVERY DAY  30 tablet  5  . KLOR-CON 10 10 MEQ tablet TAKE 2 TABLETS BY MOUTH EVERY DAY  60 tablet  0  . lisinopril (PRINIVIL,ZESTRIL) 20 MG tablet TAKE 1 TABLET BY MOUTH EVERY DAY  30 tablet  6  . nitroGLYCERIN (NITROSTAT) 0.4 MG SL tablet Place 1 tablet (0.4 mg total) under the tongue every 5 (five) minutes as needed.  90 tablet  2  . warfarin (COUMADIN) 4 MG tablet TAKE 1 TABLET BY MOUTH DAILY AS DIRECTED  60 tablet  2  . [DISCONTINUED] Carboxymethylcellul-Glycerin 0.5-0.9 % SOLN Apply to eye as needed.       . [DISCONTINUED] indapamide (LOZOL) 2.5 MG tablet TAKE 1 TABLET BY MOUTH EVERY MORNING  30 tablet  5  . [DISCONTINUED] ipratropium (ATROVENT) 0.06 % nasal spray Place 2 sprays into the nose every morning.       . [DISCONTINUED] metoprolol tartrate (LOPRESSOR) 25 MG tablet Take 0.5 tablets (12.5 mg total) by mouth 2 (two) times daily.  30 tablet  6  . [DISCONTINUED] pravastatin (PRAVACHOL) 20 MG tablet Take 20 mg by mouth daily.         Review of Systems  Constitutional: Negative.  Bruising on the left side of her neck  HENT: Negative.   Eyes: Negative.   Respiratory: Negative.   Gastrointestinal: Negative.   Musculoskeletal: Positive for gait problem.  Skin: Negative.   Neurological: Negative.   Hematological: Negative.   Psychiatric/Behavioral: Negative.   All other systems reviewed and are negative.     BP 140/56  Pulse 69  Ht 5\' 6"  (1.676 m)  Wt 128 lb 8 oz (58.287 kg)  BMI 20.74 kg/m2  Physical Exam  Nursing note and vitals reviewed. Constitutional: She is oriented to person, place, and time. She appears well-developed and  well-nourished.  HENT:  Head: Normocephalic.  Nose: Nose normal.  Mouth/Throat: Oropharynx is clear and moist.  Eyes: Conjunctivae normal are normal. Pupils are equal, round, and reactive to light.  Neck: Normal range of motion. Neck supple. No JVD present.  Cardiovascular: Normal rate, S1 normal, S2 normal, normal heart sounds and intact distal pulses.  An irregularly irregular rhythm present. Exam reveals no gallop and no friction rub.   No murmur heard. Pulmonary/Chest: Effort normal and breath sounds normal. No respiratory distress. She has no wheezes. She has no rales. She exhibits no tenderness.  Abdominal: Soft. Bowel sounds are normal. She exhibits no distension. There is no tenderness.  Musculoskeletal: Normal range of motion. She exhibits no edema and no tenderness.  Lymphadenopathy:    She has no cervical adenopathy.  Neurological: She is alert and oriented to person, place, and time. Coordination normal.  Skin: Skin is warm and dry. No rash noted. No erythema.  Psychiatric: She has a normal mood and affect. Her behavior is normal. Judgment and thought content normal.         Assessment and Plan

## 2012-10-25 NOTE — Assessment & Plan Note (Signed)
Not an ideal situation as she is a high fall risk, high stroke risk without Coumadin.

## 2012-10-25 NOTE — Assessment & Plan Note (Addendum)
We have signed off on physical therapy. He will call her tomorrow to arrange treatment at her house. She prefers [facility] as she has worked with them in 2012

## 2012-10-25 NOTE — Patient Instructions (Addendum)
You are doing well. No medication changes were made.  Please call us if you have new issues that need to be addressed before your next appt.  Your physician wants you to follow-up in: 6 months.  You will receive a reminder letter in the mail two months in advance. If you don't receive a letter, please call our office to schedule the follow-up appointment.   

## 2012-10-26 LAB — PROTIME-INR: INR: 1.1 (ref 0.9–1.1)

## 2012-10-28 ENCOUNTER — Encounter: Payer: Self-pay | Admitting: Internal Medicine

## 2012-10-28 ENCOUNTER — Ambulatory Visit (INDEPENDENT_AMBULATORY_CARE_PROVIDER_SITE_OTHER): Payer: Medicare Other | Admitting: Internal Medicine

## 2012-10-28 VITALS — BP 128/62 | HR 56 | Temp 97.6°F | Ht 66.5 in | Wt 130.0 lb

## 2012-10-28 DIAGNOSIS — D689 Coagulation defect, unspecified: Secondary | ICD-10-CM

## 2012-10-28 DIAGNOSIS — Z8673 Personal history of transient ischemic attack (TIA), and cerebral infarction without residual deficits: Secondary | ICD-10-CM

## 2012-10-28 DIAGNOSIS — D6832 Hemorrhagic disorder due to extrinsic circulating anticoagulants: Secondary | ICD-10-CM

## 2012-10-28 NOTE — Telephone Encounter (Signed)
Patient has appt with Dr. Darrick Huntsman today 10/28/2012, Dr. Darrick Huntsman can address PT orders during appt.

## 2012-10-28 NOTE — Patient Instructions (Addendum)
continue 4 mg of coumadin every day except Sundays,  On sundays take 6 mg repeat INR  Next Thursday

## 2012-10-28 NOTE — Progress Notes (Signed)
Patient ID: Latoya Merritt, female   DOB: [birth year], 78 y.o.   MRN: [id number]   Patient Active Problem List  Diagnosis  . HYPERLIPIDEMIA-MIXED  . ATRIAL FIBRILLATION  . CHEST PAIN  . ABNORMAL CV (STRESS) TEST  . HTN (hypertension)  . History of DVT of lower extremity  . Hyperlipidemia  . Hypertension  . Colon polyp  . DVT (deep venous thrombosis)  . Neck pain  . Cognitive deficits  . Tooth abscess  . History of CVA (cerebrovascular accident)  . Fall on same level from slipping, tripping or stumbling  . Warfarin-induced coagulopathy  . Shoulder pain, left    Subjective:  CC:   Chief Complaint  Patient presents with  . Follow-up    HPI:   Latoya Merritt a 78 y.o. female who presents  For follow up after admission  for acute CVA in the setting of recently discontinued coumadin due to recurrent falls and hematoma left arm..  Her symptoms were sudden onset of weakness,  which occurred at home in the middle of night after having trouble getting up from the commode followed by falling out of bed. She was admitted to [facility] and also ruled in for acute MI with a peak troponin of 2.5 ,  She was seen by cardiology and had hospital follow up with [facility].  Nonivasive testing was done. No wall motion abn on echo,  demand ischemia suspected.  Since discharge she has been using a walker and feels that her legs continue to be weak. She continues to have some weakness of the left hand which is a residual from her prior stroke. She did refuse to do rehabilitation at discharge. Has several home health aides rotating through the day,  and is alone at night but is wearing her [facility]. She has been taking 4 mg of Coumadin daily since discharge and her PT/INR done on Wednesday about 4 was 1.1 for home health physical therapy starts next week.   Past Medical History  Diagnosis Date  . Coronary artery disease   . Atrial fibrillation   . Polyp, stomach   . Colon polyp 2008  . Sensorineural  hearing loss 2006    sudden, left ear  . DVT (deep venous thrombosis) May 2007  . History of DVT of lower extremity May 2007  . Hyperlipidemia   . Hypertension   . Stroke     Past Surgical History  Procedure Date  . Laser surgery for varicose veins   . Nasal polyp surgery 2008    Dr. Jenne Campus, has been horase ever since  . Breast surgery 1972    mastectomies, presumed due to breast CA  . Abdominal hysterectomy 1965  . Appendectomy 1950  . Tonsillectomy 1943  . Laminectomy 1975  . Thyroid surgery 1977    nodule removed         The following portions of the patient's history were reviewed and updated as appropriate: Allergies, current medications, and problem list.    Review of Systems:   12 Pt  review of systems was negative except those addressed in the HPI,     History   Social History  . Marital Status: Married    Spouse Name: N/A    Number of Children: N/A  . Years of Education: N/A   Occupational History  . Not on file.   Social History Main Topics  . Smoking status: Never Smoker   . Smokeless tobacco: Never Used  . Alcohol Use: 4.2 oz/week  7 Glasses of wine per week     Comment: glass a wine at bedtime  . Drug Use: No  . Sexually Active: Not on file   Other Topics Concern  . Not on file   Social History Narrative  . No narrative on file    Objective:  BP 128/62  Pulse 56  Temp 97.6 F (36.4 C) (Oral)  Ht 5' 6.5" (1.689 m)  Wt 130 lb (58.968 kg)  BMI 20.67 kg/m2  SpO2 99%  General appearance: alert, cooperative and appears stated age Neck: no adenopathy, no carotid bruit, supple, symmetrical, trachea midline and thyroid not enlarged, symmetric, no tenderness/mass/nodules Back: symmetric, no curvature. ROM normal. No CVA tenderness. Lungs: clear to auscultation bilaterally Heart: irreg irreg,  S1, S2 normal, no murmur, click, rub or gallop Abdomen: soft, non-tender; bowel sounds normal; no masses,  no organomegaly Pulses: 2+ and  symmetric Skin: Skin color, texture, turgor normal. No rashes or lesions Lymph nodes: Cervical, supraclavicular, and axillary nodes normal. Neuro: slight weakness of left hand and arm. LLE strength 4+/5 No facial droop.  Assessment and Plan:  History of CVA (cerebrovascular accident) Recent MRI during admission showed changes in the right parietal region consistent with recent stroke. Her Coumadin was resumed given her history of atrial fibrillation.. Statin was also started for LDL greater than 100.   Warfarin-induced coagulopathy In the past her prior prolonged INR was in the setting of alcohol over indulgence. She has been abstinent since our last talk. Current INR is 1.1 on 4 mg daily. Her dose has been increased to 6 mg  1 times weekly. Repeat INR in 2 weeks.   Updated Medication List Outpatient Encounter Prescriptions as of 10/28/2012  Medication Sig Dispense Refill  . calcium carbonate (OS-CAL) 600 MG TABS Take 600 mg by mouth daily.        . Calcium Carbonate-Simethicone (MAALOX ADVANCED MAX ST) 1000-60 MG CHEW Chew 1 tablet by mouth as needed.        . Carboxymethylcellul-Glycerin (REFRESH OPTIVE OP) Apply to eye.      Marland Kitchen ipratropium (ATROVENT) 0.06 % nasal spray Place 2 sprays into the nose daily.      . isosorbide mononitrate (IMDUR) 60 MG 24 hr tablet TAKE 1 TABLET BY MOUTH EVERY DAY  30 tablet  5  . KLOR-CON 10 10 MEQ tablet TAKE 2 TABLETS BY MOUTH EVERY DAY  60 tablet  0  . lisinopril (PRINIVIL,ZESTRIL) 20 MG tablet TAKE 1 TABLET BY MOUTH EVERY DAY  30 tablet  6  . metoprolol succinate (TOPROL-XL) 25 MG 24 hr tablet Take 25 mg by mouth at bedtime.      . nitroGLYCERIN (NITROSTAT) 0.4 MG SL tablet Place 1 tablet (0.4 mg total) under the tongue every 5 (five) minutes as needed.  90 tablet  2  . simvastatin (ZOCOR) 20 MG tablet Take 20 mg by mouth at bedtime.      Marland Kitchen warfarin (COUMADIN) 4 MG tablet TAKE 1 TABLET BY MOUTH DAILY AS DIRECTED  60 tablet  2     No orders of the  defined types were placed in this encounter.    No Follow-up on file.

## 2012-10-30 NOTE — Assessment & Plan Note (Signed)
Recent MRI during admission showed changes in the right parietal region consistent with recent stroke. Her Coumadin was resumed given her history of atrial fibrillation.. Statin was also started for LDL greater than 100.

## 2012-10-30 NOTE — Assessment & Plan Note (Addendum)
In the past her prior prolonged INR was in the setting of alcohol over indulgence. She has been abstinent since our last talk. Current INR is 1.1 on 4 mg daily. Her dose has been increased to 6 mg  1 times weekly. Repeat INR in 2 weeks.

## 2012-11-03 ENCOUNTER — Encounter: Payer: Self-pay | Admitting: Cardiovascular Disease

## 2012-11-04 ENCOUNTER — Encounter: Payer: Self-pay | Admitting: Internal Medicine

## 2012-11-04 ENCOUNTER — Telehealth: Payer: Self-pay | Admitting: Internal Medicine

## 2012-11-04 NOTE — Telephone Encounter (Signed)
Her coumadin level is too high  Please confirm that she has been taking 4 mg every day except once a week she was to take 6 mg.  If she has been taking it correctly,  Ask her to Reduce the dose to 4 mg every day  And take no coumadin until Sunday.  Repeat level in 2 weeks

## 2012-11-04 NOTE — Telephone Encounter (Signed)
Patient notified of coumadin change as directed.

## 2012-11-08 ENCOUNTER — Other Ambulatory Visit: Payer: Self-pay | Admitting: Internal Medicine

## 2012-11-08 ENCOUNTER — Other Ambulatory Visit: Payer: Self-pay

## 2012-11-08 MED ORDER — POTASSIUM CHLORIDE ER 10 MEQ PO TBCR: 10.0000 meq | EXTENDED_RELEASE_TABLET | Freq: Every day | ORAL | Status: DC

## 2012-11-08 NOTE — Telephone Encounter (Signed)
Refill request for Potassium 10 MEQ sent to [facility]

## 2012-11-11 ENCOUNTER — Encounter: Payer: Self-pay | Admitting: Internal Medicine

## 2012-11-18 ENCOUNTER — Other Ambulatory Visit: Payer: Self-pay

## 2012-11-21 ENCOUNTER — Encounter: Payer: Self-pay | Admitting: Internal Medicine

## 2012-11-21 LAB — PROTIME-INR: INR: 6 — AB (ref 0.9–1.1)

## 2012-11-22 ENCOUNTER — Telehealth: Payer: Self-pay | Admitting: Internal Medicine

## 2012-11-22 DIAGNOSIS — D689 Coagulation defect, unspecified: Secondary | ICD-10-CM

## 2012-11-22 DIAGNOSIS — W010XXA Fall on same level from slipping, tripping and stumbling without subsequent striking against object, initial encounter: Secondary | ICD-10-CM

## 2012-11-22 DIAGNOSIS — R079 Chest pain, unspecified: Secondary | ICD-10-CM

## 2012-11-22 DIAGNOSIS — I10 Essential (primary) hypertension: Secondary | ICD-10-CM

## 2012-11-22 DIAGNOSIS — R0602 Shortness of breath: Secondary | ICD-10-CM

## 2012-11-22 NOTE — Telephone Encounter (Signed)
Patient notifified of lab results and advised patient to stop coumadin and have INR repeated on Thursday.

## 2012-11-22 NOTE — Telephone Encounter (Signed)
Her coumadin level is critically high.  Stop the coumadin,  Repeat the INR on Thursday

## 2012-11-24 ENCOUNTER — Telehealth: Payer: Self-pay | Admitting: Cardiovascular Disease

## 2012-11-24 NOTE — Telephone Encounter (Signed)
Pt wants to know if she can resume driving.

## 2012-11-25 LAB — PROTIME-INR: INR: 1.6 — AB (ref 0.9–1.1)

## 2012-11-25 NOTE — Telephone Encounter (Signed)
I discussed with Dr. Mariah Milling who suggests to leave this decision up to Dr. Darrick Huntsman.  I will forward this to her.

## 2012-11-25 NOTE — Telephone Encounter (Signed)
I would not feel comfortable telling her to resume driving unless she passes an on-road driving test by the [facility].

## 2012-11-25 NOTE — Telephone Encounter (Signed)
Going to have to review with Dr. Mariah Milling

## 2012-11-25 NOTE — Telephone Encounter (Signed)
Pt calling wants to know if Dr Mariah Milling has decided to see if she can drive

## 2012-11-28 ENCOUNTER — Telehealth: Payer: Self-pay | Admitting: Internal Medicine

## 2012-11-28 NOTE — Telephone Encounter (Signed)
I called pt to tell her she needs to take a driving test with [employer] before Dr. Darrick Huntsman will release her to drive Pt became very upset, says she was told to refrain from driving at most recent hospital d/c and wants her independence back I explained, per Dr. Mariah Milling, he is leaving this up to Dr. Darrick Huntsman Pt began c/o being "double billed" by Dr. Melina Schools office, etc. I explained she needs to discuss with their office Pt then says she was a prior pt of Dr. Jari Sportsman in [place] and would like to re establish with him appt made with Dr. Kirke Corin tomm at 0915 at pt's request

## 2012-11-28 NOTE — Telephone Encounter (Signed)
Resume coumadin at 4 mg starting today  Repeat level in one week pT/INR

## 2012-11-28 NOTE — Telephone Encounter (Signed)
Patient notified via phone of lab results and dosage change.advised patient to repeat INR in 1 week.

## 2012-11-29 ENCOUNTER — Ambulatory Visit (INDEPENDENT_AMBULATORY_CARE_PROVIDER_SITE_OTHER): Payer: Medicare Other | Admitting: Cardiovascular Disease

## 2012-11-29 ENCOUNTER — Encounter: Payer: Self-pay | Admitting: Cardiovascular Disease

## 2012-11-29 VITALS — BP 140/70 | HR 54 | Ht 66.0 in | Wt 133.8 lb

## 2012-11-29 DIAGNOSIS — I10 Essential (primary) hypertension: Secondary | ICD-10-CM

## 2012-11-29 DIAGNOSIS — I4891 Unspecified atrial fibrillation: Secondary | ICD-10-CM

## 2012-11-29 DIAGNOSIS — I2510 Atherosclerotic heart disease of native coronary artery without angina pectoris: Secondary | ICD-10-CM | POA: Insufficient documentation

## 2012-11-29 NOTE — Assessment & Plan Note (Signed)
The patient likely has underlying coronary artery disease based on the results of a previous stress test as well as recent small non-ST elevation myocardial infarction. However, I have recommended medical therapy due to lack of anginal symptoms.

## 2012-11-29 NOTE — Assessment & Plan Note (Signed)
Her blood pressure is well controlled. 

## 2012-11-29 NOTE — Patient Instructions (Addendum)
Continue same medication.  You can resume driving.  Follow up in 3 months.

## 2012-11-29 NOTE — Progress Notes (Signed)
HPI  Latoya Merritt is a very pleasant 78 year old woman with a history of permanent atrial fibrillation on warfarin  , hyperlipidemia with notes indicating elevated liver function tests on statins, history of DVT, hypertension, bilateral mastectomy, presenting for routine followup. She is a patient of Dr. Darrick Huntsman. She was actually seen by me in 2010 for atrial fibrillation and dyspnea. She had a borderline abnormal nuclear stress test but was treated medically. She has been following with Dr. Mariah Milling over the last 2 years but now she is requesting to reestablish with me. She was hospitalized in November for left-sided weakness with suspected stroke. She was off warfarin at that time due to significant left arm hematoma after a fall. She did have a non-ST elevation MI with peak troponin 2.5 felt secondary to demand ischemia. No cardiac workup was performed apart from echocardiogram. Echocardiogram showed normal LV systolic function. During her hospitalization, we discussed the possibility of switching her to a novel oral anticoagulation medication given that she has been having difficulty maintaining a therapeutic INR on warfarin. However, these medications were not covered insurance and she could not afford them. Overall, she has been doing very well. She had home physical therapy and did well very well. She reports being back to her baseline. She denies any chest pain, dyspnea, dizziness or syncope.   Allergies  Allergen Reactions  . Amoxicillin     rash  . Amoxicillin-Pot Clavulanate     Swelling & rash  . Cephalexin     rash  . Cephalexin   . Clarithromycin     rash  . Clindamycin/Lincomycin     rash  . Lansoprazole   . Macrodantin   . Sulfa Antibiotics   . Tussionex Pennkinetic Er (Hydrocod Polst-Cpm Polst Er)     Rash across her chest and side     Current Outpatient Prescriptions on File Prior to Visit  Medication Sig Dispense Refill  . calcium carbonate (OS-CAL) 600 MG TABS Take  600 mg by mouth daily.        . Calcium Carbonate-Simethicone (MAALOX ADVANCED MAX ST) 1000-60 MG CHEW Chew 1 tablet by mouth as needed.        . Carboxymethylcellul-Glycerin (REFRESH OPTIVE OP) Apply to eye.      Marland Kitchen ipratropium (ATROVENT) 0.06 % nasal spray Place 2 sprays into the nose daily.      . isosorbide mononitrate (IMDUR) 60 MG 24 hr tablet TAKE 1 TABLET BY MOUTH EVERY DAY  30 tablet  5  . lisinopril (PRINIVIL,ZESTRIL) 20 MG tablet TAKE 1 TABLET BY MOUTH EVERY DAY  30 tablet  6  . metoprolol succinate (TOPROL-XL) 25 MG 24 hr tablet Take 25 mg by mouth at bedtime.      . nitroGLYCERIN (NITROSTAT) 0.4 MG SL tablet Place 1 tablet (0.4 mg total) under the tongue every 5 (five) minutes as needed.  90 tablet  2  . potassium chloride (KLOR-CON 10) 10 MEQ tablet Take 1 tablet (10 mEq total) by mouth daily.  60 tablet  0  . simvastatin (ZOCOR) 20 MG tablet Take 20 mg by mouth at bedtime.      Marland Kitchen warfarin (COUMADIN) 4 MG tablet TAKE 1 TABLET BY MOUTH DAILY AS DIRECTED  60 tablet  2     Past Medical History  Diagnosis Date  . Coronary artery disease   . Atrial fibrillation   . Polyp, stomach   . Colon polyp 2008  . Sensorineural hearing loss 2006    sudden, left  ear  . DVT (deep venous thrombosis) May 2007  . History of DVT of lower extremity May 2007  . Hyperlipidemia   . Hypertension   . Stroke      Past Surgical History  Procedure Date  . Laser surgery for varicose veins   . Nasal polyp surgery 2008    Dr. Jenne Campus, has been horase ever since  . Breast surgery 1972    mastectomies, presumed due to breast CA  . Abdominal hysterectomy 1965  . Appendectomy 1950  . Tonsillectomy 1943  . Laminectomy 1975  . Thyroid surgery 1977    nodule removed     Family History  Problem Relation Age of Onset  . Cancer Other      History   Social History  . Marital Status: Married    Spouse Name: N/A    Number of Children: N/A  . Years of Education: N/A   Occupational History    . Not on file.   Social History Main Topics  . Smoking status: Never Smoker   . Smokeless tobacco: Never Used  . Alcohol Use: 4.2 oz/week    7 Glasses of wine per week     Comment: glass a wine at bedtime  . Drug Use: No  . Sexually Active: Not on file   Other Topics Concern  . Not on file   Social History Narrative  . No narrative on file     PHYSICAL EXAM   BP 140/70  Pulse 54  Ht 5\' 6"  (1.676 m)  Wt 133 lb 12 oz (60.669 kg)  BMI 21.59 kg/m2 Constitutional: She is oriented to person, place, and time. She appears well-developed and well-nourished. No distress.  HENT: No nasal discharge.  Head: Normocephalic and atraumatic.  Eyes: Pupils are equal and round. Right eye exhibits no discharge. Left eye exhibits no discharge.  Neck: Normal range of motion. Neck supple. No JVD present. No thyromegaly present.  Cardiovascular: Normal rate, irregular rhythm, normal heart sounds. Exam reveals no gallop and no friction rub. No murmur heard.  Pulmonary/Chest: Effort normal and breath sounds normal. No stridor. No respiratory distress. She has no wheezes. She has no rales. She exhibits no tenderness.  Abdominal: Soft. Bowel sounds are normal. She exhibits no distension. There is no tenderness. There is no rebound and no guarding.  Musculoskeletal: Normal range of motion. She exhibits no edema and no tenderness.  Neurological: She is alert and oriented to person, place, and time. Coordination normal.  Skin: Skin is warm and dry. No rash noted. She is not diaphoretic. No erythema. No pallor.  Psychiatric: She has a normal mood and affect. Her behavior is normal. Judgment and thought content normal.    EKG: Atrial fibrillation  -Old anterior infarct.   ABNORMAL     ASSESSMENT AND PLAN

## 2012-11-29 NOTE — Assessment & Plan Note (Signed)
This is a permanent and currently being treated with rate control and anticoagulation. She did have a recent stroke. She is at significant risk of thromboembolic complications related to atrial fibrillation. I do think that the benefits of anticoagulation outweigh the risks. She has not had any recurrent falls. She had physical therapy and she seems to be back to her baseline. The patient is requesting to be able to drive again. She has been driving all her life with no previous accidents. I watched her walking and coordination in the clinic and did not see any significant deficits. She seems to be back to her previous baseline before hospitalization. I did review the documentation in the chart about this issue. I feel that her lack of driving would significantly affect her independence and active lifestyle. Thus, I don't see a contraindication for her to resume driving especially that she did not have any syncope.

## 2012-12-02 ENCOUNTER — Other Ambulatory Visit: Payer: Self-pay | Admitting: Internal Medicine

## 2012-12-05 ENCOUNTER — Other Ambulatory Visit: Payer: Self-pay | Admitting: Internal Medicine

## 2012-12-05 ENCOUNTER — Telehealth: Payer: Self-pay | Admitting: Internal Medicine

## 2012-12-05 MED ORDER — SIMVASTATIN 20 MG PO TABS: 20.0000 mg | ORAL_TABLET | Freq: Every day | ORAL | Status: DC

## 2012-12-05 MED ORDER — WARFARIN SODIUM 4 MG PO TABS: ORAL_TABLET | ORAL | Status: DC

## 2012-12-05 NOTE — Telephone Encounter (Signed)
Called patient to let her know

## 2012-12-05 NOTE — Telephone Encounter (Signed)
Simvastatin 20 mg # 30 3 R sent to [facility]

## 2012-12-05 NOTE — Telephone Encounter (Signed)
Please ask Haydan to increased her coumadin to 6 mo on Mondays and Friday,, 4 mg all other days.  Repeat iNR in 2 weeks

## 2012-12-05 NOTE — Telephone Encounter (Signed)
Simvastatin 20 mg tab Take one tablet by mouth every night at bedtime #30

## 2012-12-06 ENCOUNTER — Telehealth: Payer: Self-pay | Admitting: Internal Medicine

## 2012-12-13 ENCOUNTER — Encounter: Payer: Self-pay | Admitting: Internal Medicine

## 2012-12-15 ENCOUNTER — Other Ambulatory Visit: Payer: Self-pay | Admitting: Internal Medicine

## 2012-12-15 ENCOUNTER — Encounter: Payer: Self-pay | Admitting: Internal Medicine

## 2012-12-15 NOTE — Telephone Encounter (Signed)
Med filled.  

## 2012-12-20 ENCOUNTER — Telehealth: Payer: Self-pay | Admitting: General Practice

## 2012-12-20 NOTE — Telephone Encounter (Signed)
Pt notified. Also Pt advised her BP and pulse is low appt made for 12/22/12.

## 2012-12-20 NOTE — Telephone Encounter (Signed)
Pt take 4mg  everyday except Monday and Friday is 6mg .   Pt also has a question on Potassium 2 tablets each morning with Breakfast. New Rx says to take 1 tablet in the am and 1 tablet in the pm. Please advise.

## 2012-12-20 NOTE — Telephone Encounter (Signed)
Do not resume coumadin until Thursday,  Then 4 mg daily  Every day,  Repeat INR  Next Wednesday.  The potassium pills can be taken together or split up, whichever  Is easier on her stomach

## 2012-12-22 ENCOUNTER — Encounter: Payer: Self-pay | Admitting: Internal Medicine

## 2012-12-22 ENCOUNTER — Ambulatory Visit (INDEPENDENT_AMBULATORY_CARE_PROVIDER_SITE_OTHER): Payer: Medicare Other | Admitting: Internal Medicine

## 2012-12-22 VITALS — BP 170/80 | HR 76 | Temp 98.6°F | Resp 16 | Wt 128.0 lb

## 2012-12-22 DIAGNOSIS — R001 Bradycardia, unspecified: Secondary | ICD-10-CM

## 2012-12-22 DIAGNOSIS — I4891 Unspecified atrial fibrillation: Secondary | ICD-10-CM

## 2012-12-22 DIAGNOSIS — I498 Other specified cardiac arrhythmias: Secondary | ICD-10-CM

## 2012-12-22 NOTE — Progress Notes (Signed)
Patient ID: Latoya Merritt, female   DOB: 04-07-1920, 79 y.o.   MRN: [id number]  Patient Active Problem List  Diagnosis  . HYPERLIPIDEMIA-MIXED  . ATRIAL FIBRILLATION  . CHEST PAIN  . ABNORMAL CV (STRESS) TEST  . HTN (hypertension)  . History of DVT of lower extremity  . Hyperlipidemia  . Hypertension  . Colon polyp  . DVT (deep venous thrombosis)  . Neck pain  . Cognitive deficits  . Tooth abscess  . History of CVA (cerebrovascular accident)  . Fall on same level from slipping, tripping or stumbling  . Warfarin-induced coagulopathy  . Shoulder pain, left  . Coronary artery disease  . Bradycardia, sinus    Subjective:  CC:   Chief Complaint  Patient presents with  . Follow-up    blood pressure    HPI:   Latoya Merritt a 79 y.o. female who presents Management of hypertension and bradycardia. Patient has been asymptomatic on metoprolol 12.5 mg twice daily for management of atrial fibrillation but her visiting  Sister was alarmed at her readings and advised her to seek medical evaluation.  She has brought log in of her blood pressures and pulses. Her pulse has been running in the low 50s occasionally to 49.  Blood pressures have been 115 to 140 systolic,  60-80 diastolic.  Not having any headaches  . Not feeling dizzy or presyncopal. No recent falls in months.    Past Medical History  Diagnosis Date  . Atrial fibrillation   . Polyp, stomach   . Colon polyp 2008  . Sensorineural hearing loss 2006    sudden, left ear  . DVT (deep venous thrombosis) May 2007  . History of DVT of lower extremity May 2007  . Hyperlipidemia   . Hypertension   . Stroke   . Coronary artery disease     Past Surgical History  Procedure Date  . Laser surgery for varicose veins   . Nasal polyp surgery 2008    Dr. Jenne Campus, has been horase ever since  . Breast surgery 1972    mastectomies, presumed due to breast CA  . Abdominal hysterectomy 1965  . Appendectomy 1950  . Tonsillectomy  1943  . Laminectomy 1975  . Thyroid surgery 1977    nodule removed         The following portions of the patient's history were reviewed and updated as appropriate: Allergies, current medications, and problem list.    Review of Systems:   Patient denies headache, fevers, malaise, unintentional weight loss, skin rash, eye pain, sinus congestion and sinus pain, sore throat, dysphagia,  hemoptysis , cough, dyspnea, wheezing, chest pain, palpitations, orthopnea, edema, abdominal pain, nausea, melena, diarrhea, constipation, flank pain, dysuria, hematuria, urinary  Frequency, nocturia, numbness, tingling, seizures,  Focal weakness, Loss of consciousness,  Tremor, insomnia, depression, anxiety, and suicidal ideation.        History   Social History  . Marital Status: Married    Spouse Name: N/A    Number of Children: N/A  . Years of Education: N/A   Occupational History  . Not on file.   Social History Main Topics  . Smoking status: Never Smoker   . Smokeless tobacco: Never Used  . Alcohol Use: 4.2 oz/week    7 Glasses of wine per week     Comment: glass a wine at bedtime  . Drug Use: No  . Sexually Active: Not on file   Other Topics Concern  . Not on file  Social History Narrative  . No narrative on file    Objective:  BP 170/80  Pulse 76  Temp 98.6 F (37 C) (Oral)  Resp 16  Wt 128 lb (58.06 kg)  SpO2 97%  General appearance: alert, cooperative and appears stated age Ears: normal TM's and external ear canals both ears Throat: lips, mucosa, and tongue normal; teeth and gums normal Neck: no adenopathy, no carotid bruit, supple, symmetrical, trachea midline and thyroid not enlarged, symmetric, no tenderness/mass/nodules Back: symmetric, no curvature. ROM normal. No CVA tenderness. Lungs: clear to auscultation bilaterally Heart: regular rate and rhythm, S1, S2 normal, no murmur, click, rub or gallop Abdomen: soft, non-tender; bowel sounds normal; no masses,   no organomegaly Pulses: 2+ and symmetric Skin: Skin color, texture, turgor normal. No rashes or lesions Lymph nodes: Cervical, supraclavicular, and axillary nodes normal.  Assessment and Plan:  ATRIAL FIBRILLATION Rate controlled on low dose lopressor 12.5 mg bid. Pulse is 46 to 54, but she is not dizzy or having a headache.  Pulse does increase with ambulation. If she develops symptoms we will stop the lopressor.    Bradycardia, sinus Despite having a heart rate from 49-55 she is asymptomatic. I advised her to continue her current dose of Lopressor unless she begins to feel tired or presyncopal or dizzy. The options of switching her to Cardizem were discussed but she does not want the fluid retention.   Updated Medication List Outpatient Encounter Prescriptions as of 12/22/2012  Medication Sig Dispense Refill  . calcium carbonate (OS-CAL) 600 MG TABS Take 600 mg by mouth daily.        . Calcium Carbonate-Simethicone (MAALOX ADVANCED MAX ST) 1000-60 MG CHEW Chew 1 tablet by mouth as needed.        . Carboxymethylcellul-Glycerin (REFRESH OPTIVE OP) Apply to eye.      . indapamide (LOZOL) 2.5 MG tablet Take 2.5 mg by mouth every morning.      Marland Kitchen ipratropium (ATROVENT) 0.06 % nasal spray Place 2 sprays into the nose daily.      . isosorbide mononitrate (IMDUR) 60 MG 24 hr tablet TAKE 1 TABLET BY MOUTH EVERY DAY  30 tablet  5  . KLOR-CON 10 10 MEQ tablet TAKE 1 TABLET BY MOUTH TWICE DAILY  60 tablet  0  . lisinopril (PRINIVIL,ZESTRIL) 20 MG tablet TAKE 1 TABLET BY MOUTH EVERY DAY  30 tablet  6  . metoprolol tartrate (LOPRESSOR) 25 MG tablet TAKE ONE-HALF TABLET BY MOUTH TWICE DAILY  30 tablet  0  . nitroGLYCERIN (NITROSTAT) 0.4 MG SL tablet Place 1 tablet (0.4 mg total) under the tongue every 5 (five) minutes as needed.  90 tablet  2  . simvastatin (ZOCOR) 20 MG tablet Take 1 tablet (20 mg total) by mouth at bedtime.  30 tablet  3  . warfarin (COUMADIN) 4 MG tablet 6 mg on Mondays and fridays,   4 mg all other days  60 tablet  2  . [DISCONTINUED] metoprolol succinate (TOPROL-XL) 25 MG 24 hr tablet Take 25 mg by mouth at bedtime.         No orders of the defined types were placed in this encounter.    No Follow-up on file.

## 2012-12-22 NOTE — Assessment & Plan Note (Addendum)
Rate controlled on low dose lopressor 12.5 mg bid. Pulse is 46 to 54, but she is not dizzy or having a headache.  If she develops sympomts we will stop the lopressor.

## 2012-12-22 NOTE — Assessment & Plan Note (Signed)
Despite having a heart rate from 49-55 she is asymptomatic. I advised her to continue her current dose of Lopressor unless she begins to feel tired or presyncopal or dizzy. The options of switching her to Cardizem were discussed but she does not want the fluid retention.

## 2012-12-22 NOTE — Patient Instructions (Addendum)
I agree with Dr. Kirke Corin that you are ok to resume driving during the day.  Your pulse is low because of the metoprolol.  As long as you do not feel dizzy or foggy,  You can continue 1/2 tablet twice daily as you are doing.    If you start to feel this way,  Stop the metoprolol  Return to [facility] next Wednesday for a coumadin check

## 2012-12-28 ENCOUNTER — Encounter: Payer: Self-pay | Admitting: Internal Medicine

## 2012-12-29 ENCOUNTER — Other Ambulatory Visit: Payer: Self-pay | Admitting: Internal Medicine

## 2012-12-29 NOTE — Telephone Encounter (Signed)
Please change coumadin regimen to 4 mg every day.  Skip tonight's dose bc coumadin level is borderline too high.    repeat INR in 2 weeks

## 2012-12-30 ENCOUNTER — Other Ambulatory Visit: Payer: Self-pay | Admitting: Internal Medicine

## 2012-12-30 NOTE — Telephone Encounter (Signed)
There is an outstanding telephone message to you from me about her last INR from yesterday and it comments on the dose I want her on.  I cannot access it .in the chart   Do not refill the coumadin until you have accessed that message

## 2012-12-30 NOTE — Telephone Encounter (Signed)
Pt last INR on 12/13 ok to refill?

## 2012-12-30 NOTE — Telephone Encounter (Signed)
° °  warfarin (COUMADIN) 4 MG tablet  #60

## 2012-12-31 NOTE — Telephone Encounter (Signed)
Pt notified and told to repeat INR.

## 2012-12-31 NOTE — Telephone Encounter (Signed)
Pt does not need coumadin refilled has a month's supply from the hospital. . Pt advised she is on 4mg  daily.

## 2013-01-02 ENCOUNTER — Other Ambulatory Visit: Payer: Self-pay | Admitting: Internal Medicine

## 2013-01-02 NOTE — Telephone Encounter (Signed)
Med filled.  

## 2013-01-10 ENCOUNTER — Encounter: Payer: Self-pay | Admitting: Internal Medicine

## 2013-01-12 LAB — PROTIME-INR

## 2013-01-13 ENCOUNTER — Telehealth: Payer: Self-pay | Admitting: Internal Medicine

## 2013-01-13 NOTE — Telephone Encounter (Signed)
INR is very high.  Stop coumadin immediately and return on Monday for coumadin check

## 2013-01-13 NOTE — Telephone Encounter (Signed)
Pt.notified

## 2013-01-15 ENCOUNTER — Other Ambulatory Visit: Payer: Self-pay | Admitting: Internal Medicine

## 2013-01-16 LAB — PROTIME-INR

## 2013-01-24 ENCOUNTER — Other Ambulatory Visit: Payer: Self-pay | Admitting: Internal Medicine

## 2013-01-25 ENCOUNTER — Encounter: Payer: Self-pay | Admitting: Internal Medicine

## 2013-01-25 ENCOUNTER — Telehealth: Payer: Self-pay | Admitting: Internal Medicine

## 2013-01-25 NOTE — Telephone Encounter (Signed)
No,. I do not ,  This is really getting old.  Plase find out where she went to have the coumadin checked. There is no pending result

## 2013-01-25 NOTE — Telephone Encounter (Signed)
I only see results from 1/8. Have you seen anything newer?

## 2013-01-25 NOTE — Telephone Encounter (Signed)
Called to request from [employer] [employer] but could not reach anyone.

## 2013-01-25 NOTE — Telephone Encounter (Signed)
Patient wanting her lab work results. She has been off her warfarin since the 1.24.14 and her labs were repeated on the 1.27.14.

## 2013-01-26 ENCOUNTER — Encounter: Payer: Self-pay | Admitting: Internal Medicine

## 2013-01-30 ENCOUNTER — Encounter: Payer: Self-pay | Admitting: Internal Medicine

## 2013-01-31 ENCOUNTER — Other Ambulatory Visit: Payer: Self-pay | Admitting: Internal Medicine

## 2013-02-07 ENCOUNTER — Telehealth: Payer: Self-pay | Admitting: Internal Medicine

## 2013-02-07 NOTE — Telephone Encounter (Signed)
Please ask Latoya Merritt to consider having her PT/INR done here at the office instead of at [facility] ,or to consider using the home weekly testing company.  The last result was not faxed over until Feb 5th and it is too difficult to track them down.

## 2013-02-08 ENCOUNTER — Telehealth: Payer: Self-pay | Admitting: *Deleted

## 2013-02-08 NOTE — Telephone Encounter (Signed)
Patient walked in yesterday stating she has not had her coumadin since 01/13/2013. She was instructed to stop taking it on 01/13/13 and have repeat labs done. Per patient she never received a phone call back about her f/u lab for INR and every time she called she was told the results were not here. Discussed this with Dr. Darrick Huntsman and the patient was told to start back taking the same dose she was taking prior to stopping then come back in 1 week to have her INR checked. Per patient she was taking 4 mg daily. Instructed her to have her labs drawn early that morning since she likes to go to [facility] so we may get the results sooner and make any necessary adjustments. Patient did verbalize understanding and agreed with the plan set forth.

## 2013-02-08 NOTE — Telephone Encounter (Signed)
Pt is going to start coming into our office to get this done. Has an appt for 2/25 can you put in orders for the labs.

## 2013-02-11 ENCOUNTER — Other Ambulatory Visit: Payer: Self-pay | Admitting: Cardiovascular Disease

## 2013-02-13 ENCOUNTER — Other Ambulatory Visit: Payer: Self-pay | Admitting: *Deleted

## 2013-02-13 DIAGNOSIS — Z7901 Long term (current) use of anticoagulants: Secondary | ICD-10-CM

## 2013-02-13 MED ORDER — ISOSORBIDE MONONITRATE ER 60 MG PO TB24: ORAL_TABLET | ORAL | Status: DC

## 2013-02-13 NOTE — Telephone Encounter (Signed)
Refilled Isosorbide sent to [facility]. 

## 2013-02-14 ENCOUNTER — Other Ambulatory Visit (INDEPENDENT_AMBULATORY_CARE_PROVIDER_SITE_OTHER): Payer: Medicare Other

## 2013-02-14 DIAGNOSIS — Z7901 Long term (current) use of anticoagulants: Secondary | ICD-10-CM

## 2013-02-14 LAB — PROTIME-INR: Prothrombin Time: 24.7 s — ABNORMAL HIGH (ref 10.2–12.4)

## 2013-02-15 ENCOUNTER — Other Ambulatory Visit: Payer: Self-pay | Admitting: Internal Medicine

## 2013-02-22 ENCOUNTER — Other Ambulatory Visit: Payer: Self-pay | Admitting: Internal Medicine

## 2013-02-27 ENCOUNTER — Ambulatory Visit (INDEPENDENT_AMBULATORY_CARE_PROVIDER_SITE_OTHER): Payer: Medicare Other | Admitting: Cardiovascular Disease

## 2013-02-27 ENCOUNTER — Encounter: Payer: Self-pay | Admitting: Cardiovascular Disease

## 2013-02-27 VITALS — BP 140/68 | HR 75 | Ht 66.5 in | Wt 139.8 lb

## 2013-02-27 DIAGNOSIS — E785 Hyperlipidemia, unspecified: Secondary | ICD-10-CM

## 2013-02-27 DIAGNOSIS — I4891 Unspecified atrial fibrillation: Secondary | ICD-10-CM

## 2013-02-27 DIAGNOSIS — I2510 Atherosclerotic heart disease of native coronary artery without angina pectoris: Secondary | ICD-10-CM

## 2013-02-27 MED ORDER — METOPROLOL TARTRATE 25 MG PO TABS: 12.5000 mg | ORAL_TABLET | Freq: Two times a day (BID) | ORAL | Status: DC

## 2013-02-27 NOTE — Patient Instructions (Addendum)
Stop Simvastatin.  Follow up in 6 months.

## 2013-02-27 NOTE — Progress Notes (Signed)
HPI  Latoya Merritt is a very pleasant 79 year old woman with a history of permanent atrial fibrillation on warfarin  , hyperlipidemia with notes indicating elevated liver function tests on statins, history of DVT, hypertension, bilateral mastectomy, presenting for routine followup. She is a patient of Dr. Darrick Huntsman.  She was hospitalized in November for left-sided weakness with suspected stroke. She was off warfarin at that time due to significant left arm hematoma after a fall. She did have a non-ST elevation MI with peak troponin 2.5 felt secondary to demand ischemia. No cardiac workup was performed apart from echocardiogram. Echocardiogram showed normal LV systolic function. Warfarin was resumed. She reports no adverse events since then.  She is doing very well and denies any chest pain, dizziness or shortness of breath. She resumed her driving. Her only complaint is night mares happening almost regularly. She thinks is due to simvastatin.   Allergies  Allergen Reactions  . Amoxicillin     rash  . Amoxicillin-Pot Clavulanate     Swelling & rash  . Cephalexin     rash  . Cephalexin   . Clarithromycin     rash  . Clindamycin/Lincomycin     rash  . Lansoprazole   . Macrodantin   . Sulfa Antibiotics   . Tussionex Pennkinetic Er (Hydrocod Polst-Cpm Polst Er)     Rash across her chest and side     Current Outpatient Prescriptions on File Prior to Visit  Medication Sig Dispense Refill  . calcium carbonate (OS-CAL) 600 MG TABS Take 600 mg by mouth daily.        . Calcium Carbonate-Simethicone (MAALOX ADVANCED MAX ST) 1000-60 MG CHEW Chew 1 tablet by mouth as needed.        . Carboxymethylcellul-Glycerin (REFRESH OPTIVE OP) Apply to eye.      . indapamide (LOZOL) 2.5 MG tablet Take 2.5 mg by mouth every morning.      . indapamide (LOZOL) 2.5 MG tablet TAKE ONE TABLET BY MOUTH EVERY MORNING  30 tablet  11  . ipratropium (ATROVENT) 0.06 % nasal spray Place 2 sprays into the nose daily.       . isosorbide mononitrate (IMDUR) 60 MG 24 hr tablet TAKE 1 TABLET BY MOUTH EVERY DAY  30 tablet  5  . KLOR-CON 10 10 MEQ tablet TAKE 1 TABLET BY MOUTH TWICE DAILY  60 tablet  5  . lisinopril (PRINIVIL,ZESTRIL) 20 MG tablet TAKE 1 TABLET BY MOUTH EVERY DAY  30 tablet  6  . nitroGLYCERIN (NITROSTAT) 0.4 MG SL tablet Place 1 tablet (0.4 mg total) under the tongue every 5 (five) minutes as needed.  90 tablet  2  . warfarin (COUMADIN) 4 MG tablet 6 mg on Mondays and fridays,  4 mg all other days  60 tablet  2  . warfarin (COUMADIN) 4 MG tablet TAKE 1 TABLET BY MOUTH DAILY AS DIRECTED  60 tablet  0   No current facility-administered medications on file prior to visit.     Past Medical History  Diagnosis Date  . Atrial fibrillation   . Polyp, stomach   . Colon polyp 2008  . Sensorineural hearing loss 2006    sudden, left ear  . DVT (deep venous thrombosis) May 2007  . History of DVT of lower extremity May 2007  . Hyperlipidemia   . Hypertension   . Stroke   . Coronary artery disease      Past Surgical History  Procedure Laterality Date  . Laser surgery  for varicose veins    . Nasal polyp surgery  2008    Dr. Jenne Campus, has been horase ever since  . Breast surgery  1972    mastectomies, presumed due to breast CA  . Abdominal hysterectomy  1965  . Appendectomy  1950  . Tonsillectomy  1943  . Laminectomy  1975  . Thyroid surgery  1977    nodule removed     Family History  Problem Relation Age of Onset  . Cancer Other      History   Social History  . Marital Status: Married    Spouse Name: N/A    Number of Children: N/A  . Years of Education: N/A   Occupational History  . Not on file.   Social History Main Topics  . Smoking status: Never Smoker   . Smokeless tobacco: Never Used  . Alcohol Use: 4.2 oz/week    7 Glasses of wine per week     Comment: glass a wine at bedtime  . Drug Use: No  . Sexually Active: Not on file   Other Topics Concern  . Not on file     Social History Narrative  . No narrative on file     PHYSICAL EXAM   BP 140/68  Pulse 75  Ht 5' 6.5" (1.689 m)  Wt 139 lb 12 oz (63.39 kg)  BMI 22.22 kg/m2 Constitutional: She is oriented to person, place, and time. She appears well-developed and well-nourished. No distress.  HENT: No nasal discharge.  Head: Normocephalic and atraumatic.  Eyes: Pupils are equal and round. Right eye exhibits no discharge. Left eye exhibits no discharge.  Neck: Normal range of motion. Neck supple. No JVD present. No thyromegaly present.  Cardiovascular: Normal rate, irregular rhythm, normal heart sounds. Exam reveals no gallop and no friction rub. No murmur heard.  Pulmonary/Chest: Effort normal and breath sounds normal. No stridor. No respiratory distress. She has no wheezes. She has no rales. She exhibits no tenderness.  Abdominal: Soft. Bowel sounds are normal. She exhibits no distension. There is no tenderness. There is no rebound and no guarding.  Musculoskeletal: Normal range of motion. She exhibits no edema and no tenderness.  Neurological: She is alert and oriented to person, place, and time. Coordination normal.  Skin: Skin is warm and dry. No rash noted. She is not diaphoretic. No erythema. No pallor.  Psychiatric: She has a normal mood and affect. Her behavior is normal. Judgment and thought content normal.    EKG: Atrial fibrillation  -Old anterior infarct.   ABNORMAL     ASSESSMENT AND PLAN

## 2013-02-27 NOTE — Assessment & Plan Note (Signed)
She reports significant nightmares since she was started on simvastatin. I asked her to hold his medications. If symptoms improve, no need to resume this medication.

## 2013-02-27 NOTE — Assessment & Plan Note (Signed)
She is doing well overall. Her ventricular rate is controlled with small dose metoprolol 12.5 mg twice daily. Her heart rate tends to run slow with this in the 50s but she is overall asymptomatic. She is tolerating anticoagulation with warfarin.

## 2013-03-02 ENCOUNTER — Other Ambulatory Visit: Payer: Self-pay | Admitting: Internal Medicine

## 2013-03-02 NOTE — Telephone Encounter (Signed)
Med filled.  

## 2013-03-14 ENCOUNTER — Other Ambulatory Visit: Payer: Medicare Other

## 2013-03-14 ENCOUNTER — Other Ambulatory Visit (INDEPENDENT_AMBULATORY_CARE_PROVIDER_SITE_OTHER): Payer: Medicare Other

## 2013-03-14 DIAGNOSIS — Z7901 Long term (current) use of anticoagulants: Secondary | ICD-10-CM

## 2013-03-14 LAB — PROTIME-INR
INR: 4.1 ratio — ABNORMAL HIGH (ref 0.8–1.0)
Prothrombin Time: 42.4 s — ABNORMAL HIGH (ref 10.2–12.4)

## 2013-03-15 ENCOUNTER — Other Ambulatory Visit: Payer: Self-pay | Admitting: General Practice

## 2013-03-15 MED ORDER — WARFARIN SODIUM 3 MG PO TABS: 3.0000 mg | ORAL_TABLET | Freq: Every day | ORAL | Status: DC

## 2013-03-28 ENCOUNTER — Other Ambulatory Visit: Payer: Self-pay | Admitting: *Deleted

## 2013-03-28 DIAGNOSIS — Z7901 Long term (current) use of anticoagulants: Secondary | ICD-10-CM

## 2013-03-29 ENCOUNTER — Other Ambulatory Visit (INDEPENDENT_AMBULATORY_CARE_PROVIDER_SITE_OTHER): Payer: Medicare Other

## 2013-03-29 DIAGNOSIS — Z7901 Long term (current) use of anticoagulants: Secondary | ICD-10-CM

## 2013-03-29 LAB — PROTIME-INR: Prothrombin Time: 19.6 s — ABNORMAL HIGH (ref 10.2–12.4)

## 2013-04-12 ENCOUNTER — Other Ambulatory Visit: Payer: Self-pay | Admitting: Internal Medicine

## 2013-04-12 NOTE — Telephone Encounter (Signed)
Rx sent to pharmacy by escript  

## 2013-04-25 ENCOUNTER — Encounter: Payer: Self-pay | Admitting: Internal Medicine

## 2013-04-25 ENCOUNTER — Ambulatory Visit (INDEPENDENT_AMBULATORY_CARE_PROVIDER_SITE_OTHER): Payer: Medicare Other | Admitting: Internal Medicine

## 2013-04-25 VITALS — BP 148/70 | HR 67 | Temp 98.2°F | Resp 14 | Wt 138.0 lb

## 2013-04-25 DIAGNOSIS — Z7901 Long term (current) use of anticoagulants: Secondary | ICD-10-CM

## 2013-04-25 DIAGNOSIS — R5381 Other malaise: Secondary | ICD-10-CM

## 2013-04-25 DIAGNOSIS — R001 Bradycardia, unspecified: Secondary | ICD-10-CM

## 2013-04-25 DIAGNOSIS — H04123 Dry eye syndrome of bilateral lacrimal glands: Secondary | ICD-10-CM

## 2013-04-25 DIAGNOSIS — M1990 Unspecified osteoarthritis, unspecified site: Secondary | ICD-10-CM

## 2013-04-25 DIAGNOSIS — M129 Arthropathy, unspecified: Secondary | ICD-10-CM

## 2013-04-25 DIAGNOSIS — I10 Essential (primary) hypertension: Secondary | ICD-10-CM

## 2013-04-25 DIAGNOSIS — E559 Vitamin D deficiency, unspecified: Secondary | ICD-10-CM

## 2013-04-25 DIAGNOSIS — H04129 Dry eye syndrome of unspecified lacrimal gland: Secondary | ICD-10-CM

## 2013-04-25 DIAGNOSIS — T50901S Poisoning by unspecified drugs, medicaments and biological substances, accidental (unintentional), sequela: Secondary | ICD-10-CM

## 2013-04-25 DIAGNOSIS — I498 Other specified cardiac arrhythmias: Secondary | ICD-10-CM

## 2013-04-25 DIAGNOSIS — T6591XS Toxic effect of unspecified substance, accidental (unintentional), sequela: Secondary | ICD-10-CM

## 2013-04-25 DIAGNOSIS — R5383 Other fatigue: Secondary | ICD-10-CM

## 2013-04-25 DIAGNOSIS — M62838 Other muscle spasm: Secondary | ICD-10-CM

## 2013-04-25 LAB — PROTIME-INR: Prothrombin Time: 19.3 s — ABNORMAL HIGH (ref 10.2–12.4)

## 2013-04-25 NOTE — Progress Notes (Signed)
Patient ID: Latoya Merritt, female   DOB: [birth year], 79 y.o.   MRN: [id number]  Patient Active Problem List   Diagnosis Date Noted  . Bradycardia, sinus 12/22/2012  . Coronary artery disease   . Warfarin-induced coagulopathy 09/21/2012  . Shoulder pain, left 09/21/2012  . Fall on same level from slipping, tripping or stumbling 07/10/2012  . Cognitive deficits 12/31/2011  . Tooth abscess 12/31/2011  . History of CVA (cerebrovascular accident) 12/31/2011  . Neck pain 10/26/2011  . Hyperlipidemia   . Hypertension   . Colon polyp   . History of DVT of lower extremity   . HTN (hypertension) 04/13/2011  . HYPERLIPIDEMIA-MIXED 08/27/2010  . ATRIAL FIBRILLATION 08/27/2010  . CHEST PAIN 08/27/2010  . ABNORMAL CV (STRESS) TEST 08/27/2010  . DVT (deep venous thrombosis) 04/20/2006    Subjective:  CC:   Chief Complaint  Patient presents with  . Follow-up    HPI:   Latoya Merritt a 79 y.o. female who presents Follow up on chronic conditions including recurrent CVAs  , hypertension, history of alcohol abuse  and vascular dementia.  Accompanied by caregiver Nicholos Johns who stays with her one day weekly and provides transportation, as she has been advised not to drive.  Had a birthday yesterday .  Feeling well  except for a persistent nonpainful an effusion on her left elbow that has been present for a few months.  Denies any history of trauma but has had recurrent ER visits in the past for falls and has some memory deficits that she refuses to acknowledge. Reports only mild arthritis pain in both hands and increased flatulence, which she blames on her potassium supplements.   Her Imdur was increased to 60 mg  By Dr Kirke Corin     Past Medical History  Diagnosis Date  . Atrial fibrillation   . Polyp, stomach   . Colon polyp 2008  . Sensorineural hearing loss 2006    sudden, left ear  . DVT (deep venous thrombosis) May 2007  . History of DVT of lower extremity May 2007  . Hyperlipidemia    . Hypertension   . Stroke   . Coronary artery disease     Past Surgical History  Procedure Laterality Date  . Laser surgery for varicose veins    . Nasal polyp surgery  2008    Dr. Jenne Campus, has been horase ever since  . Breast surgery  1972    mastectomies, presumed due to breast CA  . Abdominal hysterectomy  1965  . Appendectomy  1950  . Tonsillectomy  1943  . Laminectomy  1975  . Thyroid surgery  1977    nodule removed       The following portions of the patient's history were reviewed and updated as appropriate: Allergies, current medications, and problem list.    Review of Systems:  Patient denies headache, fevers, malaise, unintentional weight loss, skin rash, eye pain, sinus congestion and sinus pain, sore throat, dysphagia,  hemoptysis , cough, dyspnea, wheezing, chest pain, palpitations, orthopnea, edema, abdominal pain, nausea, melena, diarrhea, constipation, flank pain, dysuria, hematuria, urinary  Frequency, nocturia, numbness, tingling, seizures,  Focal weakness, Loss of consciousness,  Tremor, insomnia, depression, anxiety, and suicidal ideation.     History   Social History  . Marital Status: Married    Spouse Name: N/A    Number of Children: N/A  . Years of Education: N/A   Occupational History  . Not on file.   Social History Main Topics  .  Smoking status: Never Smoker   . Smokeless tobacco: Never Used  . Alcohol Use: 4.2 oz/week    7 Glasses of wine per week     Comment: glass a wine at bedtime  . Drug Use: No  . Sexually Active: Not on file   Other Topics Concern  . Not on file   Social History Narrative  . No narrative on file    Objective:  BP 148/70  Pulse 67  Temp(Src) 98.2 F (36.8 C) (Oral)  Resp 14  Wt 138 lb (62.596 kg)  BMI 21.94 kg/m2  SpO2 98%  General appearance: alert, cooperative and appears stated age Ears: normal TM's and external ear canals both ears Throat: lips, mucosa, and tongue normal; teeth and gums  normal Neck: no adenopathy, no carotid bruit, supple, symmetrical, trachea midline and thyroid not enlarged, symmetric, no tenderness/mass/nodules Back: symmetric, no curvature. ROM normal. No CVA tenderness. Lungs: clear to auscultation bilaterally Heart: regular rate and rhythm, S1, S2 normal, no murmur, click, rub or gallop Abdomen: soft, non-tender; bowel sounds normal; no masses,  no organomegaly Pulses: 2+ and symmetric Skin: Skin color, texture, turgor normal. No rashes or lesions Lymph nodes: Cervical, supraclavicular, and axillary nodes normal.  Assessment and Plan:  Warfarin-induced coagulopathy Previously complicated by alcohol abuse,  No longer drinking.  She has been taking 3 mg  Daily for the past month .  Due to recurrent elevations in INR and history of falls and CVAS I am keeping her INR around 2.0 Repeat INR is due.,  She has an elbow effusion, non painful, on the left that I offered to drain if she were to stop her coumadin for 3 days prior    Hypertension Well controlled on current regimen. Renal function stable, no changes today.  Bradycardia, sinus Resolved with reduction in metoprolol dose.    Updated Medication List Outpatient Encounter Prescriptions as of 04/25/2013  Medication Sig Dispense Refill  . acetaminophen (TYLENOL) 500 MG tablet Take 500 mg by mouth every 6 (six) hours as needed for pain.      . calcium carbonate (OS-CAL) 600 MG TABS Take 600 mg by mouth daily.        . Calcium Carbonate-Simethicone (MAALOX ADVANCED MAX ST) 1000-60 MG CHEW Chew 1 tablet by mouth as needed.        . Carboxymethylcellul-Glycerin (REFRESH OPTIVE OP) Apply to eye.      . carboxymethylcellulose (REFRESH PLUS) 0.5 % SOLN 1 drop 3 (three) times daily as needed.      . indapamide (LOZOL) 2.5 MG tablet Take 2.5 mg by mouth every morning.      . indapamide (LOZOL) 2.5 MG tablet TAKE ONE TABLET BY MOUTH EVERY MORNING  30 tablet  11  . ipratropium (ATROVENT) 0.06 % nasal spray  Place 2 sprays into the nose daily.      . isosorbide mononitrate (IMDUR) 60 MG 24 hr tablet TAKE 1 TABLET BY MOUTH EVERY DAY  30 tablet  5  . KLOR-CON 10 10 MEQ tablet TAKE 1 TABLET BY MOUTH TWICE DAILY  60 tablet  5  . lisinopril (PRINIVIL,ZESTRIL) 20 MG tablet TAKE 1 TABLET BY MOUTH EVERY DAY  30 tablet  5  . metoprolol tartrate (LOPRESSOR) 25 MG tablet Take 0.5 tablets (12.5 mg total) by mouth 2 (two) times daily.  60 tablet  6  . nitroGLYCERIN (NITROSTAT) 0.4 MG SL tablet Place 1 tablet (0.4 mg total) under the tongue every 5 (five) minutes as needed.  90 tablet  2  . warfarin (COUMADIN) 3 MG tablet TAKE 1 TABLET BY MOUTH EVERY DAY  30 tablet  2  . warfarin (COUMADIN) 4 MG tablet 6 mg on Mondays and fridays,  4 mg all other days  60 tablet  2  . warfarin (COUMADIN) 4 MG tablet TAKE 1 TABLET BY MOUTH EVERY DAY AS DIRECTED  60 tablet  0   No facility-administered encounter medications on file as of 04/25/2013.     Orders Placed This Encounter  Procedures  . Protime-INR  . Comprehensive metabolic panel  . CBC with Differential  . TSH  . Vitamin D 25 hydroxy  . Magnesium  . Comprehensive metabolic panel  . Magnesium    No Follow-up on file.

## 2013-04-25 NOTE — Assessment & Plan Note (Addendum)
Previously complicated by alcohol abuse,  No longer drinking.  She has been taking 3 mg  Daily for the past month .  Due to recurrent elevations in INR and history of falls and CVAS I am keeping her INR around 2.0 Repeat INR is due.,  She has an elbow effusion, non painful, on the left that I offered to drain if she were to stop her coumadin for 3 days prior

## 2013-04-25 NOTE — Patient Instructions (Addendum)
If you want me to drain your elbow,  Make an appt and stop your coumadin 4 days prior to your visit    Over the counter  medications to try :  You can try taking Gas X daily for your gas problem,    You can try taking a stool softener to help your constipation .  You can use colace 100 mg twice daily,  Or Miralax powder dissolved in water.   Your left leg swells  because you have venous insufficiency.  It will not get better .  You have to manage it with daily use of compression stockings.  You can also elevated your leg, so your ankle is higher than your knee to allow the fluid to drain from the leg.     If these measures are not tolerated ,  You can talk to Dr. Wyn Quaker about surgery.

## 2013-04-26 LAB — CBC WITH DIFFERENTIAL/PLATELET
Basophils Absolute: 0.1 10*3/uL (ref 0.0–0.1)
Eosinophils Absolute: 0.1 10*3/uL (ref 0.0–0.7)
Lymphs Abs: 2 10*3/uL (ref 0.7–4.0)
MCHC: 33.7 g/dL (ref 30.0–36.0)
MCV: 93.4 fl (ref 78.0–100.0)
Monocytes Absolute: 0.4 10*3/uL (ref 0.1–1.0)
Neutrophils Relative %: 54.9 % (ref 43.0–77.0)
Platelets: 187 10*3/uL (ref 150.0–400.0)
RDW: 13.1 % (ref 11.5–14.6)
WBC: 5.6 10*3/uL (ref 4.5–10.5)

## 2013-04-26 LAB — COMPREHENSIVE METABOLIC PANEL
Albumin: 4.1 g/dL (ref 3.5–5.2)
BUN: 19 mg/dL (ref 6–23)
CO2: 26 mEq/L (ref 19–32)
Calcium: 9.2 mg/dL (ref 8.4–10.5)
Chloride: 102 mEq/L (ref 96–112)
GFR: 38.74 mL/min — ABNORMAL LOW (ref >60.00)
Glucose, Bld: 95 mg/dL (ref 70–99)
Potassium: 3.5 mEq/L (ref 3.5–5.1)

## 2013-04-26 LAB — MAGNESIUM: Magnesium: 1.6 mg/dL (ref 1.5–2.5)

## 2013-04-27 ENCOUNTER — Encounter: Payer: Self-pay | Admitting: Internal Medicine

## 2013-04-27 ENCOUNTER — Telehealth: Payer: Self-pay | Admitting: *Deleted

## 2013-04-27 NOTE — Assessment & Plan Note (Signed)
Resolved with reduction in metoprolol dose.

## 2013-04-27 NOTE — Telephone Encounter (Signed)
Pt is coming in for labs tomorrow 05.09.2014, is it just for a pt/inr?

## 2013-04-27 NOTE — Assessment & Plan Note (Signed)
Well controlled on current regimen. Renal function stable, no changes today. 

## 2013-04-28 ENCOUNTER — Other Ambulatory Visit: Payer: Medicare Other

## 2013-04-28 NOTE — Telephone Encounter (Signed)
Shouldn't be coming.,  We did it earlier this week when she was her for OV

## 2013-05-22 ENCOUNTER — Telehealth: Payer: Self-pay | Admitting: Internal Medicine

## 2013-05-22 DIAGNOSIS — Z7901 Long term (current) use of anticoagulants: Secondary | ICD-10-CM

## 2013-05-22 NOTE — Telephone Encounter (Signed)
Standing [facility] pt/inr order placed for this patient , Latoya Merritt wants to check with Latoya Merritt   Notify patient when set up is confirmed,

## 2013-05-22 NOTE — Telephone Encounter (Signed)
Pt came in today stating she needs to get a standing order sent to [facility] Please advise when this is done/  Pt needs to get her protime done this week.

## 2013-05-24 ENCOUNTER — Telehealth: Payer: Self-pay | Admitting: Internal Medicine

## 2013-05-24 NOTE — Telephone Encounter (Signed)
Patient wanting an order for [facility] to have her PT/INR drawn

## 2013-05-24 NOTE — Telephone Encounter (Signed)
Has she checked with [facility]?  It has been ordered.

## 2013-05-25 NOTE — Telephone Encounter (Signed)
Called patient notified order for PT\INR already sent patient stated she forgets and just needed reminder.

## 2013-05-26 ENCOUNTER — Telehealth: Payer: Self-pay | Admitting: Internal Medicine

## 2013-05-26 ENCOUNTER — Other Ambulatory Visit: Payer: Self-pay | Admitting: Internal Medicine

## 2013-05-26 LAB — PROTIME-INR

## 2013-05-26 NOTE — Telephone Encounter (Signed)
Patient lab work completed today at 11.30 AM

## 2013-05-26 NOTE — Telephone Encounter (Signed)
Confirmed that standing orders are not seen in the [facility] interface. The PT/INR orders will need to be place individually for this patient.

## 2013-05-26 NOTE — Telephone Encounter (Signed)
patinet needs standing orders at [facility] for Pt/INR asap .  THE INTERFACE IS NOT WORKING .   PUT AN ORDER BACK ON YOUR DESK , can yo call patietn to see if she has had it done this week?

## 2013-05-29 ENCOUNTER — Telehealth: Payer: Self-pay | Admitting: Internal Medicine

## 2013-05-29 NOTE — Telephone Encounter (Signed)
Coumadin level is therapeutic,  Continue current regimen and repeat PT/INR in one month 

## 2013-05-29 NOTE — Telephone Encounter (Signed)
Left message for patient to return call.

## 2013-05-30 NOTE — Telephone Encounter (Signed)
Patient notified as instructed patient returned call.

## 2013-06-08 ENCOUNTER — Encounter: Payer: Self-pay | Admitting: Internal Medicine

## 2013-06-08 ENCOUNTER — Ambulatory Visit (INDEPENDENT_AMBULATORY_CARE_PROVIDER_SITE_OTHER): Payer: Medicare Other | Admitting: Internal Medicine

## 2013-06-08 VITALS — BP 144/68 | HR 55 | Temp 98.1°F | Resp 14 | Wt 133.2 lb

## 2013-06-08 DIAGNOSIS — I252 Old myocardial infarction: Secondary | ICD-10-CM

## 2013-06-08 DIAGNOSIS — I2510 Atherosclerotic heart disease of native coronary artery without angina pectoris: Secondary | ICD-10-CM

## 2013-06-08 DIAGNOSIS — I498 Other specified cardiac arrhythmias: Secondary | ICD-10-CM

## 2013-06-08 DIAGNOSIS — R001 Bradycardia, unspecified: Secondary | ICD-10-CM

## 2013-06-08 DIAGNOSIS — I10 Essential (primary) hypertension: Secondary | ICD-10-CM

## 2013-06-08 DIAGNOSIS — I82402 Acute embolism and thrombosis of unspecified deep veins of left lower extremity: Secondary | ICD-10-CM

## 2013-06-08 DIAGNOSIS — T6591XS Toxic effect of unspecified substance, accidental (unintentional), sequela: Secondary | ICD-10-CM

## 2013-06-08 DIAGNOSIS — E785 Hyperlipidemia, unspecified: Secondary | ICD-10-CM

## 2013-06-08 DIAGNOSIS — T50901S Poisoning by unspecified drugs, medicaments and biological substances, accidental (unintentional), sequela: Secondary | ICD-10-CM

## 2013-06-08 DIAGNOSIS — I4891 Unspecified atrial fibrillation: Secondary | ICD-10-CM

## 2013-06-08 DIAGNOSIS — I82409 Acute embolism and thrombosis of unspecified deep veins of unspecified lower extremity: Secondary | ICD-10-CM

## 2013-06-08 NOTE — Patient Instructions (Addendum)
You are doing well.  We will check you cholesterol next month with your next coumadin check.  Please make sure you are fasting when you go

## 2013-06-08 NOTE — Assessment & Plan Note (Addendum)
Rate controlled,  No LE edema,  Inr therapuetic on coumadin as of early June.  Continue monthly INRs given history of coagulopathies

## 2013-06-08 NOTE — Progress Notes (Signed)
Patient ID: Latoya Merritt, female   DOB: [birth year], 79 y.o.   MRN: [id number]     Patient Active Problem List   Diagnosis Date Noted  . History of non-ST elevation myocardial infarction (NSTEMI) 06/11/2013  . Bradycardia, sinus 12/22/2012  . Coronary artery disease   . Warfarin-induced coagulopathy 09/21/2012  . Shoulder pain, left 09/21/2012  . Cognitive deficits 12/31/2011  . Tooth abscess 12/31/2011  . History of CVA (cerebrovascular accident) 12/31/2011  . Neck pain 10/26/2011  . Hyperlipidemia   . Hypertension   . Colon polyp   . History of DVT of lower extremity   . HTN (hypertension) 04/13/2011  . ATRIAL FIBRILLATION 08/27/2010  . CHEST PAIN 08/27/2010  . ABNORMAL CV (STRESS) TEST 08/27/2010  . DVT (deep venous thrombosis) 04/20/2006    Subjective:  CC:   Chief Complaint  Patient presents with  . Follow-up    HPI:   Latoya Merritt a 79 y.o. female who presents for 3 month follow on chronic conditions including atrial fibrillation with prior embolic CVA , NSTEMI and DVT, hypertension and hyperlipidemia.  She has lost 4 lbs unintentionally bc she has been restricting her diet to lower her choleterol and  avoid fluctuations in INR, which have been frequent.  She has been avoiding meat and has increased her vegetables and fruits since Christmas.  She has had no recent falls an dis walking for 15 minutes daily.  She feels generally well and has no complaints today.  Her previously recurrent elbow effusion has resolved.   Past Medical History  Diagnosis Date  . Atrial fibrillation   . Polyp, stomach   . Colon polyp 2008  . Sensorineural hearing loss 2006    sudden, left ear  . DVT (deep venous thrombosis) May 2007  . History of DVT of lower extremity May 2007  . Hyperlipidemia   . Hypertension   . Stroke   . Coronary artery disease     Past Surgical History  Procedure Laterality Date  . Laser surgery for varicose veins    . Nasal polyp surgery  2008     Dr. Jenne Campus, has been horase ever since  . Breast surgery  1972    mastectomies, presumed due to breast CA  . Abdominal hysterectomy  1965  . Appendectomy  1950  . Tonsillectomy  1943  . Laminectomy  1975  . Thyroid surgery  1977    nodule removed       The following portions of the patient's history were reviewed and updated as appropriate: Allergies, current medications, and problem list.    Review of Systems:   12 Pt  review of systems was negative except those addressed in the HPI,     History   Social History  . Marital Status: Married    Spouse Name: N/A    Number of Children: N/A  . Years of Education: N/A   Occupational History  . Not on file.   Social History Main Topics  . Smoking status: Never Smoker   . Smokeless tobacco: Never Used  . Alcohol Use: 4.2 oz/week    7 Glasses of wine per week     Comment: glass a wine at bedtime  . Drug Use: No  . Sexually Active: Not on file   Other Topics Concern  . Not on file   Social History Narrative  . No narrative on file    Objective:  BP 144/68  Pulse 55  Temp(Src) 98.1 F (36.7 C) (  Oral)  Resp 14  Wt 133 lb 4 oz (60.442 kg)  BMI 21.19 kg/m2  SpO2 98%  General appearance: alert, cooperative and appears stated age Ears: normal TM's and external ear canals both ears Throat: lips, mucosa, and tongue normal; teeth and gums normal Neck: no adenopathy, no carotid bruit, supple, symmetrical, trachea midline and thyroid not enlarged, symmetric, no tenderness/mass/nodules Back: symmetric, no curvature. ROM normal. No CVA tenderness. Lungs: clear to auscultation bilaterally Heart: regular rate and rhythm, S1, S2 normal, no murmur, click, rub or gallop Abdomen: soft, non-tender; bowel sounds normal; no masses,  no organomegaly Pulses: 2+ and symmetric Skin: Skin color, texture, turgor normal. No rashes or lesions Lymph nodes: Cervical, supraclavicular, and axillary nodes normal.  Assessment and  Plan:  ATRIAL FIBRILLATION Rate controlled,  No LE edema,  Inr therapuetic on coumadin as of early June.  Continue monthly INRs given history of coagulopathies  Coronary artery disease Patient remains asymptomatic and is walking regularly. All risk factors are well controlled. LDL was 109 and HDL  61 in Nov 2013 without statin therapy   DVT (deep venous thrombosis) With concurrent atrial fibrillation,  Embolic CVA and   lifelong coumadin  Vs alternative anticoagulant   History of non-ST elevation myocardial infarction (NSTEMI) LDl is 109 , HDL 61 in November during admission and statin was started.  She did not tolerate simvastatin due to nightmares.  Would not resume therapy, given her advanced age unless LDL > 160.  She is asympomatic, on beta blocker, ACE Inhibitor and long acting nitrate.   Warfarin-induced coagulopathy Recurrent olecranon effusion has finally resolved.    Bradycardia, sinus Beta blocker induced,  Asymptomatic. No changes today   Hyperlipidemia Simvastatin was suspended in March due to patient having recurrent nightmares while on therapy.  Repeat lipids will be checked with next month's INR at [facility].  HTN (hypertension) Well controlled on current regimen. Renal function stable, no changes today.   A total of 40 minutes was spent with patient more than half of which was spent in counseling, reviewing records from other prviders and coordination of care. Updated Medication List Outpatient Encounter Prescriptions as of 06/08/2013  Medication Sig Dispense Refill  . acetaminophen (TYLENOL) 500 MG tablet Take 500 mg by mouth every 6 (six) hours as needed for pain.      . calcium carbonate (OS-CAL) 600 MG TABS Take 600 mg by mouth daily.        . indapamide (LOZOL) 2.5 MG tablet Take 2.5 mg by mouth every morning.      Marland Kitchen ipratropium (ATROVENT) 0.06 % nasal spray Place 2 sprays into the nose daily.      . isosorbide mononitrate (IMDUR) 60 MG 24 hr tablet TAKE 1 TABLET  BY MOUTH EVERY DAY  30 tablet  5  . KLOR-CON 10 10 MEQ tablet TAKE 1 TABLET BY MOUTH TWICE DAILY  60 tablet  5  . lisinopril (PRINIVIL,ZESTRIL) 20 MG tablet TAKE 1 TABLET BY MOUTH EVERY DAY  30 tablet  5  . metoprolol tartrate (LOPRESSOR) 25 MG tablet Take 0.5 tablets (12.5 mg total) by mouth 2 (two) times daily.  60 tablet  6  . warfarin (COUMADIN) 3 MG tablet TAKE 1 TABLET BY MOUTH EVERY DAY  30 tablet  2  . [DISCONTINUED] indapamide (LOZOL) 2.5 MG tablet TAKE ONE TABLET BY MOUTH EVERY MORNING  30 tablet  11  . Calcium Carbonate-Simethicone (MAALOX ADVANCED MAX ST) 1000-60 MG CHEW Chew 1 tablet by mouth as needed.        Marland Kitchen  Carboxymethylcellul-Glycerin (REFRESH OPTIVE OP) Apply to eye.      . carboxymethylcellulose (REFRESH PLUS) 0.5 % SOLN 1 drop 3 (three) times daily as needed.      . nitroGLYCERIN (NITROSTAT) 0.4 MG SL tablet Place 1 tablet (0.4 mg total) under the tongue every 5 (five) minutes as needed.  90 tablet  2  . warfarin (COUMADIN) 4 MG tablet 6 mg on Mondays and fridays,  4 mg all other days  60 tablet  2  . warfarin (COUMADIN) 4 MG tablet TAKE 1 TABLET BY MOUTH EVERY DAY AS DIRECTED  60 tablet  0   No facility-administered encounter medications on file as of 06/08/2013.     No orders of the defined types were placed in this encounter.    No Follow-up on file.

## 2013-06-11 ENCOUNTER — Encounter: Payer: Self-pay | Admitting: Internal Medicine

## 2013-06-11 DIAGNOSIS — I252 Old myocardial infarction: Secondary | ICD-10-CM | POA: Insufficient documentation

## 2013-06-11 NOTE — Assessment & Plan Note (Signed)
With concurrent atrial fibrillation,  Embolic CVA and   lifelong coumadin  Vs alternative anticoagulant

## 2013-06-11 NOTE — Assessment & Plan Note (Addendum)
LDl is 109 , HDL 61 in November during admission and statin was started.  She did not tolerate simvastatin due to nightmares.  Would not resume therapy, given her advanced age unless LDL > 160.  She is asympomatic, on beta blocker, ACE Inhibitor and long acting nitrate.

## 2013-06-11 NOTE — Assessment & Plan Note (Signed)
Beta blocker induced,  Asymptomatic. No changes today

## 2013-06-11 NOTE — Assessment & Plan Note (Signed)
Simvastatin was suspended in March due to patient having recurrent nightmares while on therapy.  Repeat lipids will be checked with next month's INR at [facility].

## 2013-06-11 NOTE — Assessment & Plan Note (Signed)
Recurrent olecranon effusion has finally resolved.

## 2013-06-11 NOTE — Assessment & Plan Note (Signed)
Well controlled on current regimen. Renal function stable, no changes today. 

## 2013-06-11 NOTE — Assessment & Plan Note (Addendum)
Patient remains asymptomatic and is walking regularly. All risk factors are well controlled. LDL was 109 and HDL  61 in Nov 2013 without statin therapy

## 2013-06-28 ENCOUNTER — Telehealth: Payer: Self-pay | Admitting: Internal Medicine

## 2013-06-28 NOTE — Telephone Encounter (Signed)
Patient returned call and notified. 

## 2013-06-28 NOTE — Telephone Encounter (Signed)
All labs normal,. Including  cholesterol . Coumadin level is therapeutic,  Continue current regimen and repeat PT/INR in one month./

## 2013-06-28 NOTE — Telephone Encounter (Signed)
Left message for pt to return my call.

## 2013-07-05 ENCOUNTER — Encounter: Payer: Self-pay | Admitting: Internal Medicine

## 2013-07-06 ENCOUNTER — Encounter: Payer: Self-pay | Admitting: Internal Medicine

## 2013-07-09 ENCOUNTER — Other Ambulatory Visit: Payer: Self-pay | Admitting: Internal Medicine

## 2013-07-31 ENCOUNTER — Telehealth: Payer: Self-pay | Admitting: Internal Medicine

## 2013-07-31 NOTE — Telephone Encounter (Signed)
Patient notified and voiced understanding.

## 2013-07-31 NOTE — Telephone Encounter (Signed)
Coumadin level is therapeutic,  Continue current regimen and repeat PT/INR in one month 

## 2013-08-07 ENCOUNTER — Other Ambulatory Visit: Payer: Self-pay | Admitting: Internal Medicine

## 2013-08-07 ENCOUNTER — Encounter: Payer: Self-pay | Admitting: Internal Medicine

## 2013-08-08 ENCOUNTER — Other Ambulatory Visit: Payer: Self-pay | Admitting: *Deleted

## 2013-08-08 MED ORDER — ISOSORBIDE MONONITRATE ER 60 MG PO TB24: ORAL_TABLET | ORAL | Status: DC

## 2013-08-28 LAB — PROTIME-INR: INR: 2.7 — AB (ref 0.9–1.1)

## 2013-08-29 ENCOUNTER — Telehealth: Payer: Self-pay | Admitting: Internal Medicine

## 2013-08-29 NOTE — Telephone Encounter (Signed)
Coumadin level is therapeutic,  Continue current regimen and repeat PT/INR in one month 

## 2013-08-30 NOTE — Telephone Encounter (Signed)
Patient notified

## 2013-09-05 ENCOUNTER — Encounter: Payer: Self-pay | Admitting: Cardiovascular Disease

## 2013-09-05 ENCOUNTER — Ambulatory Visit (INDEPENDENT_AMBULATORY_CARE_PROVIDER_SITE_OTHER): Payer: Medicare Other | Admitting: Cardiovascular Disease

## 2013-09-05 VITALS — BP 150/77 | HR 54 | Ht 66.0 in | Wt 141.5 lb

## 2013-09-05 DIAGNOSIS — R0789 Other chest pain: Secondary | ICD-10-CM

## 2013-09-05 DIAGNOSIS — R079 Chest pain, unspecified: Secondary | ICD-10-CM

## 2013-09-05 DIAGNOSIS — I4891 Unspecified atrial fibrillation: Secondary | ICD-10-CM

## 2013-09-05 NOTE — Patient Instructions (Addendum)
Continue same medications.  Follow up in 6 months.  

## 2013-09-05 NOTE — Assessment & Plan Note (Signed)
It sounds that she is having nocturnal angina. No significant change in her EKG. She denies exertional chest pain. Symptoms are overall mild and thus I would continue observation and monitoring given her age.

## 2013-09-05 NOTE — Assessment & Plan Note (Signed)
She is stable from a cardiac standpoint. Continue rate control with small dose metoprolol. She is tolerating long-term anticoagulation with warfarin which is being managed by Dr. Darrick Huntsman.

## 2013-09-05 NOTE — Progress Notes (Signed)
HPI  Latoya Merritt is a very pleasant 79 year old woman with a history of permanent atrial fibrillation on warfarin, hyperlipidemia with notes indicating elevated liver function tests on statins, history of DVT, hypertension, bilateral mastectomy, presenting for routine followup.  She was hospitalized in November, 2013 for left-sided weakness with suspected stroke. She was off warfarin at that time due to significant left arm hematoma after a fall. She did have a non-ST elevation MI with peak troponin 2.5 felt secondary to demand ischemia. No cardiac workup was performed apart from echocardiogram. Echocardiogram showed normal LV systolic function. Warfarin was resumed. She reports no adverse events since then.  She reports few episodes of substernal chest discomfort described as tightness feeling at night and early morning. This does not happen with physical activities.   Allergies  Allergen Reactions  . Amoxicillin     rash  . Amoxicillin-Pot Clavulanate     Swelling & rash  . Cephalexin     rash  . Cephalexin   . Clarithromycin     rash  . Clindamycin/Lincomycin     rash  . Lansoprazole   . Macrodantin   . Sulfa Antibiotics   . Tussionex Pennkinetic Er [Hydrocod Polst-Cpm Polst Er]     Rash across her chest and side     Current Outpatient Prescriptions on File Prior to Visit  Medication Sig Dispense Refill  . acetaminophen (TYLENOL) 500 MG tablet Take 500 mg by mouth every 6 (six) hours as needed for pain.      . calcium carbonate (OS-CAL) 600 MG TABS Take 600 mg by mouth daily.        . Calcium Carbonate-Simethicone (MAALOX ADVANCED MAX ST) 1000-60 MG CHEW Chew 1 tablet by mouth as needed.        . Carboxymethylcellul-Glycerin (REFRESH OPTIVE OP) Apply to eye.      . carboxymethylcellulose (REFRESH PLUS) 0.5 % SOLN 1 drop 3 (three) times daily as needed.      . indapamide (LOZOL) 2.5 MG tablet Take 2.5 mg by mouth every morning.      Marland Kitchen ipratropium (ATROVENT) 0.06 % nasal spray  Place 2 sprays into the nose daily.      . isosorbide mononitrate (IMDUR) 60 MG 24 hr tablet TAKE 1 TABLET BY MOUTH EVERY DAY  30 tablet  5  . KLOR-CON 10 10 MEQ tablet TAKE 1 TABLET BY MOUTH TWICE DAILY  60 tablet  3  . lisinopril (PRINIVIL,ZESTRIL) 20 MG tablet TAKE 1 TABLET BY MOUTH EVERY DAY  30 tablet  5  . metoprolol tartrate (LOPRESSOR) 25 MG tablet Take 0.5 tablets (12.5 mg total) by mouth 2 (two) times daily.  60 tablet  6  . nitroGLYCERIN (NITROSTAT) 0.4 MG SL tablet Place 1 tablet (0.4 mg total) under the tongue every 5 (five) minutes as needed.  90 tablet  2  . warfarin (COUMADIN) 3 MG tablet TAKE ONE TABLET BY MOUTH EVERY DAY  30 tablet  3   No current facility-administered medications on file prior to visit.     Past Medical History  Diagnosis Date  . Atrial fibrillation   . Polyp, stomach   . Colon polyp 2008  . Sensorineural hearing loss 2006    sudden, left ear  . DVT (deep venous thrombosis) May 2007  . History of DVT of lower extremity May 2007  . Hyperlipidemia   . Hypertension   . Stroke   . Coronary artery disease      Past Surgical History  Procedure Laterality  Date  . Laser surgery for varicose veins    . Nasal polyp surgery  2008    Dr. Jenne Campus, has been horase ever since  . Breast surgery  1972    mastectomies, presumed due to breast CA  . Abdominal hysterectomy  1965  . Appendectomy  1950  . Tonsillectomy  1943  . Laminectomy  1975  . Thyroid surgery  1977    nodule removed     Family History  Problem Relation Age of Onset  . Cancer Other   . Heart failure Mother   . Heart failure Brother      History   Social History  . Marital Status: Married    Spouse Name: N/A    Number of Children: N/A  . Years of Education: N/A   Occupational History  . Not on file.   Social History Main Topics  . Smoking status: Never Smoker   . Smokeless tobacco: Never Used  . Alcohol Use: No     Comment: glass a wine at bedtime  . Drug Use: No  .  Sexual Activity: Not on file   Other Topics Concern  . Not on file   Social History Narrative  . No narrative on file     PHYSICAL EXAM   BP 150/77  Pulse 54  Ht 5\' 6"  (1.676 m)  Wt 141 lb 8 oz (64.184 kg)  BMI 22.85 kg/m2 Constitutional: She is oriented to person, place, and time. She appears well-developed and well-nourished. No distress.  HENT: No nasal discharge.  Head: Normocephalic and atraumatic.  Eyes: Pupils are equal and round. Right eye exhibits no discharge. Left eye exhibits no discharge.  Neck: Normal range of motion. Neck supple. No JVD present. No thyromegaly present.  Cardiovascular: Normal rate, irregular rhythm, normal heart sounds. Exam reveals no gallop and no friction rub. No murmur heard.  Pulmonary/Chest: Effort normal and breath sounds normal. No stridor. No respiratory distress. She has no wheezes. She has no rales. She exhibits no tenderness.  Abdominal: Soft. Bowel sounds are normal. She exhibits no distension. There is no tenderness. There is no rebound and no guarding.  Musculoskeletal: Normal range of motion. She exhibits no edema and no tenderness.  Neurological: She is alert and oriented to person, place, and time. Coordination normal.  Skin: Skin is warm and dry. No rash noted. She is not diaphoretic. No erythema. No pallor.  Psychiatric: She has a normal mood and affect. Her behavior is normal. Judgment and thought content normal.    EKG: Atrial fibrillation with ventricular rate of 54 beats per minute -Old anterior infarct.   ABNORMAL     ASSESSMENT AND PLAN

## 2013-09-14 ENCOUNTER — Encounter: Payer: Self-pay | Admitting: Internal Medicine

## 2013-09-28 ENCOUNTER — Telehealth: Payer: Self-pay | Admitting: Internal Medicine

## 2013-09-28 NOTE — Telephone Encounter (Signed)
Coumadin level is therapeutic,  Continue current regimen and repeat PT/INR in one month 

## 2013-09-29 NOTE — Telephone Encounter (Signed)
Patient notified and voiced understanding.

## 2013-09-29 NOTE — Telephone Encounter (Signed)
Left message for pt to return my call.

## 2013-10-03 ENCOUNTER — Other Ambulatory Visit: Payer: Self-pay | Admitting: Internal Medicine

## 2013-10-17 ENCOUNTER — Encounter: Payer: Self-pay | Admitting: Internal Medicine

## 2013-10-31 ENCOUNTER — Telehealth: Payer: Self-pay | Admitting: *Deleted

## 2013-10-31 NOTE — Telephone Encounter (Signed)
[facility] called need diagnosis code for 10/26/13 please advise code. [phone].

## 2013-11-01 NOTE — Telephone Encounter (Signed)
v58.61

## 2013-11-03 NOTE — Telephone Encounter (Signed)
Code given to [facility]

## 2013-11-14 ENCOUNTER — Ambulatory Visit (INDEPENDENT_AMBULATORY_CARE_PROVIDER_SITE_OTHER): Payer: Medicare Other

## 2013-11-14 ENCOUNTER — Encounter: Payer: Self-pay | Admitting: Internal Medicine

## 2013-11-14 DIAGNOSIS — Z23 Encounter for immunization: Secondary | ICD-10-CM

## 2013-11-28 ENCOUNTER — Other Ambulatory Visit: Payer: Self-pay | Admitting: Internal Medicine

## 2013-11-29 ENCOUNTER — Other Ambulatory Visit: Payer: Self-pay | Admitting: Internal Medicine

## 2013-12-01 LAB — PROTIME-INR: INR: 2.5 — ABNORMAL HIGH (ref 0.8–1.2)

## 2013-12-07 IMAGING — US US EXTREM LOW VENOUS*L*
1 series · 17 of 22 positions shown · non-contrast
Comparison: none

REASON FOR EXAM: leg swelling
COMMENTS:

PROCEDURE:     US  - US DOPPLER LOW EXTR LEFT  - December 13, 2011  [DATE]
RESULT:     The left femoral and popliteal veins are normally compressible.
The waveform patterns are normal and the color flow images are normal. The
response to the augmentation and Valsalva maneuvers is normal.

[Series 1: us extrem low venous*left* · 17 of 22 slices shown]
[im 1/22]
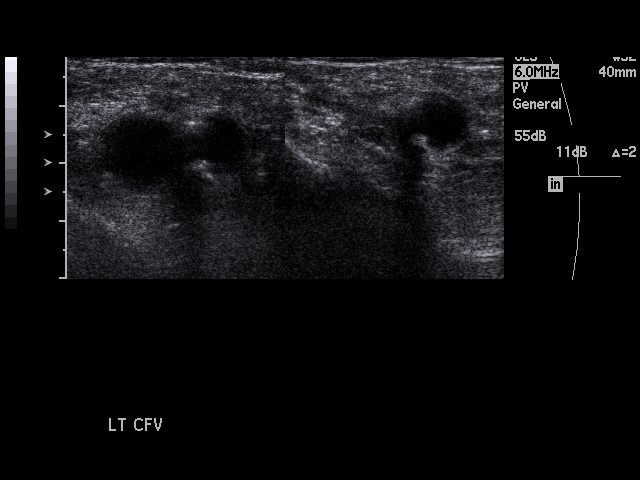
[im 2/22]
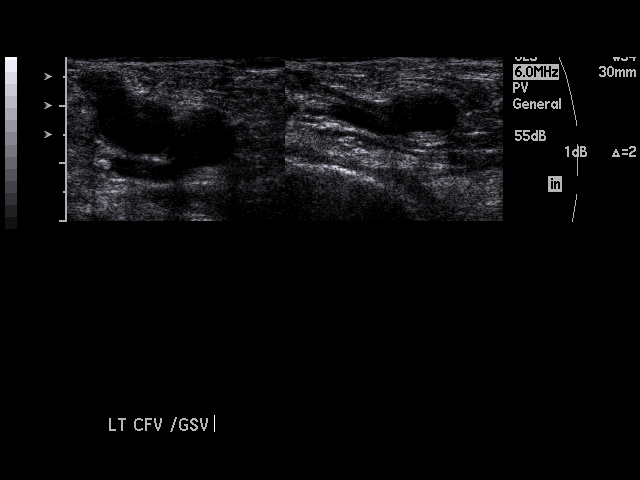
[im 4/22]
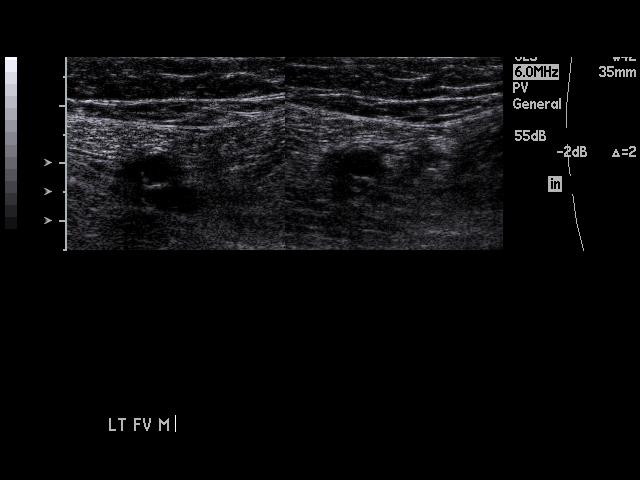
[im 5/22]
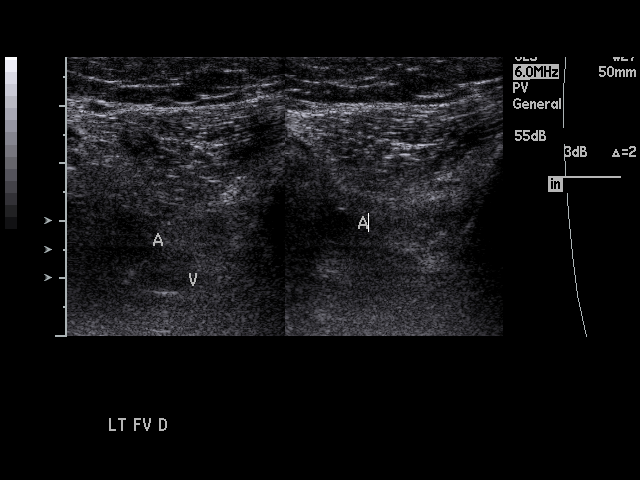
[im 6/22]
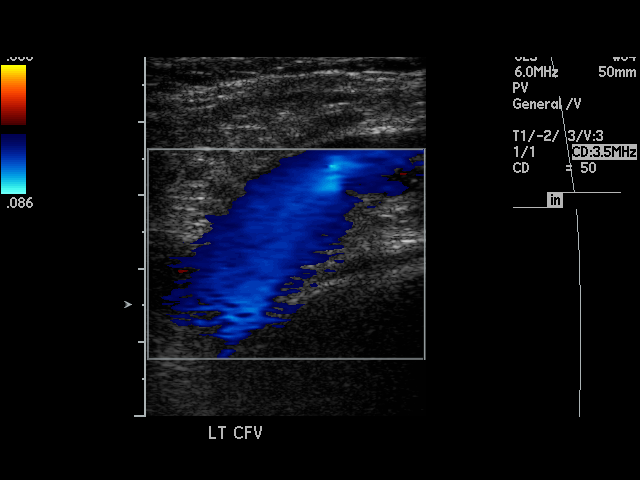
[im 8/22]
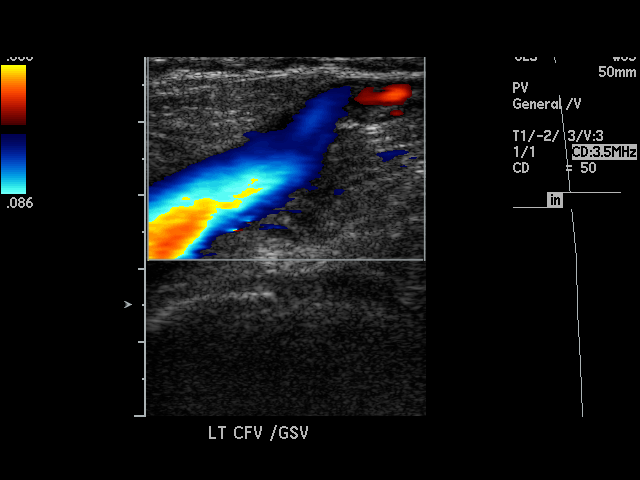
[im 9/22]
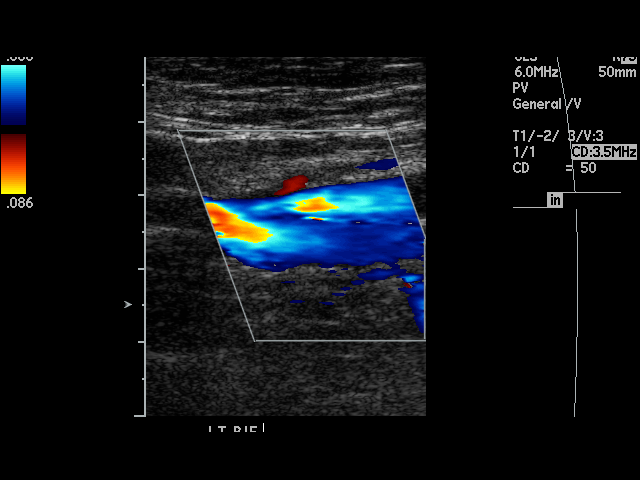
[im 10/22]
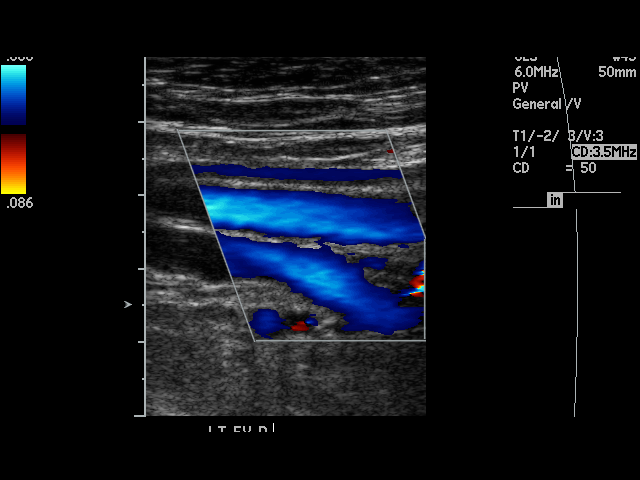
[im 12/22]
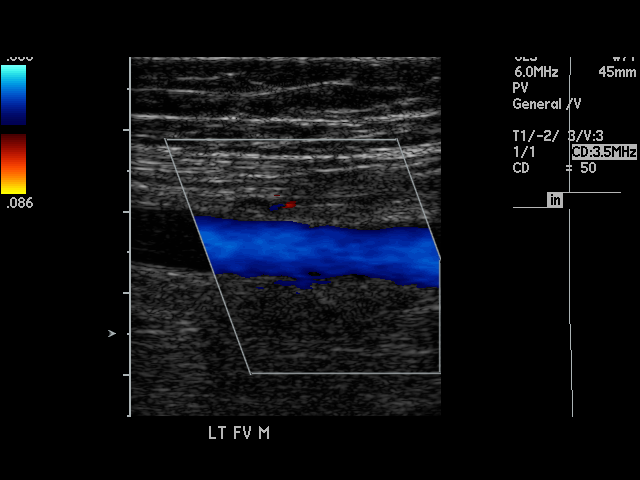
[im 13/22]
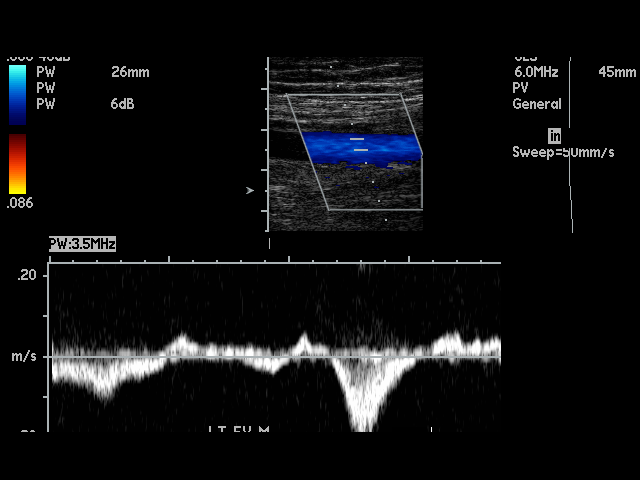
[im 14/22]
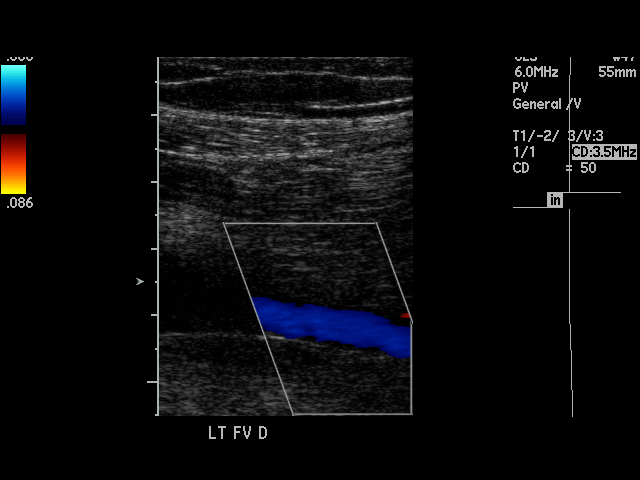
[im 15/22]
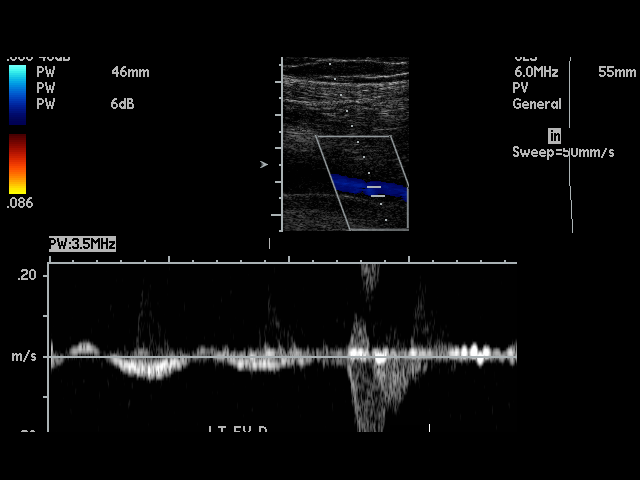
[im 17/22]
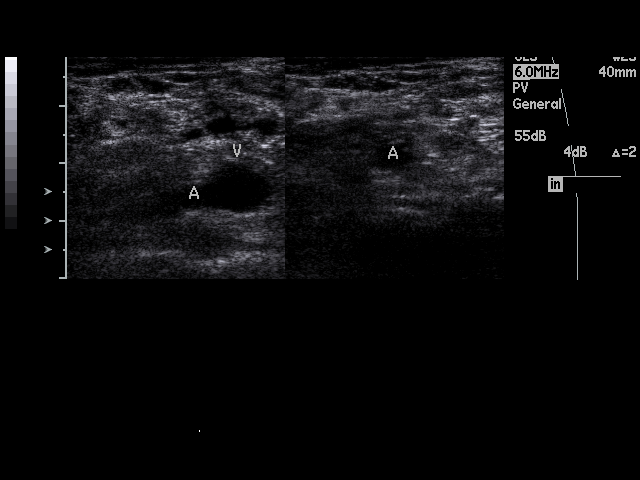
[im 18/22]
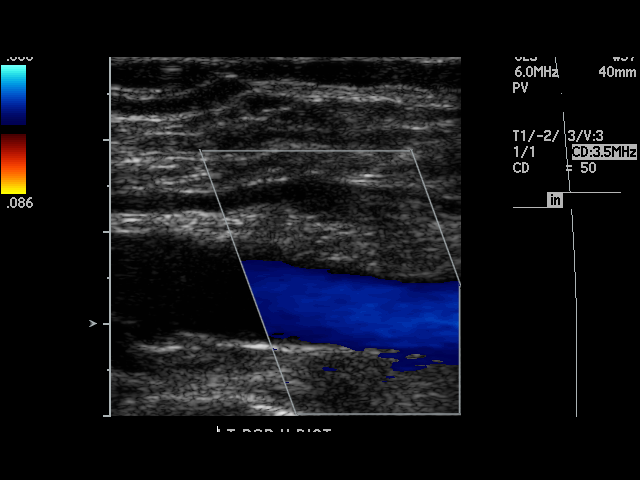
[im 19/22]
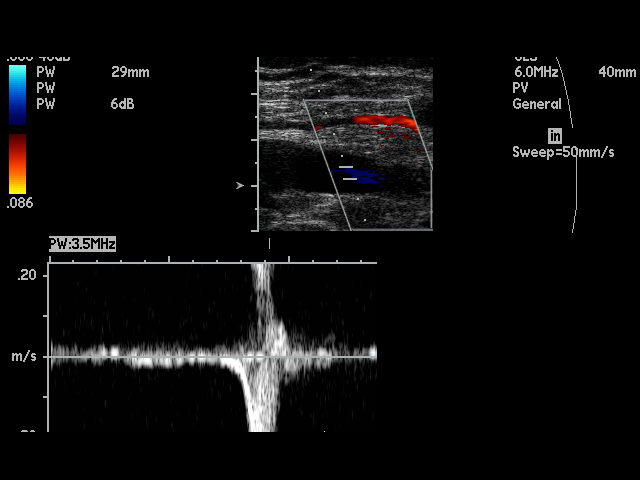
[im 21/22]
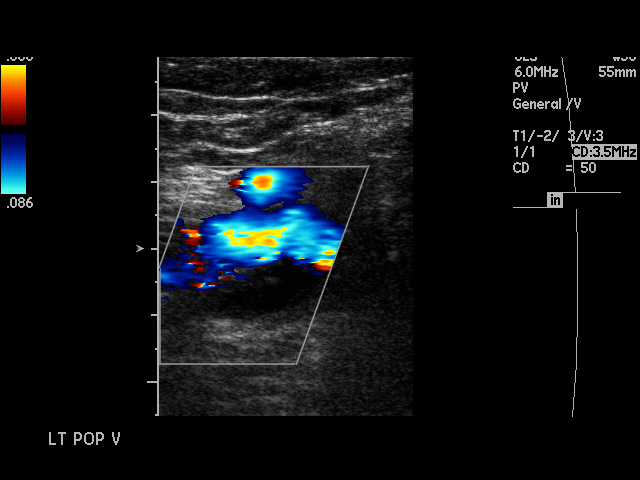
[im 22/22]
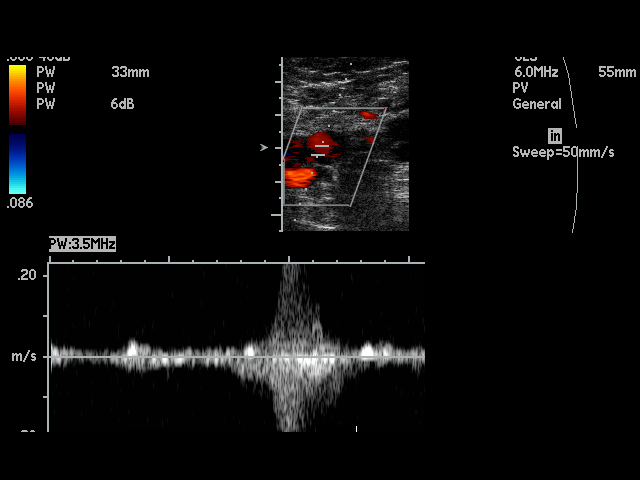

[17 of 22 positions shown; findings below may reference images not displayed]

IMPRESSION: I see no evidence of thrombus within the left femoral or
popliteal veins.

## 2014-01-01 ENCOUNTER — Other Ambulatory Visit: Payer: Self-pay | Admitting: Internal Medicine

## 2014-01-01 NOTE — Telephone Encounter (Signed)
Appt 01/09/14.

## 2014-01-05 ENCOUNTER — Other Ambulatory Visit: Payer: Self-pay | Admitting: Internal Medicine

## 2014-01-09 ENCOUNTER — Encounter: Payer: Self-pay | Admitting: Internal Medicine

## 2014-01-09 ENCOUNTER — Ambulatory Visit (INDEPENDENT_AMBULATORY_CARE_PROVIDER_SITE_OTHER): Payer: Medicare Other | Admitting: Internal Medicine

## 2014-01-09 VITALS — BP 164/72 | HR 57 | Temp 97.6°F | Resp 14 | Wt 134.4 lb

## 2014-01-09 DIAGNOSIS — T45515A Adverse effect of anticoagulants, initial encounter: Secondary | ICD-10-CM

## 2014-01-09 DIAGNOSIS — I10 Essential (primary) hypertension: Secondary | ICD-10-CM

## 2014-01-09 DIAGNOSIS — Z23 Encounter for immunization: Secondary | ICD-10-CM

## 2014-01-09 DIAGNOSIS — D6832 Hemorrhagic disorder due to extrinsic circulating anticoagulants: Secondary | ICD-10-CM

## 2014-01-09 DIAGNOSIS — D689 Coagulation defect, unspecified: Secondary | ICD-10-CM

## 2014-01-09 DIAGNOSIS — I4891 Unspecified atrial fibrillation: Secondary | ICD-10-CM

## 2014-01-09 DIAGNOSIS — R5381 Other malaise: Secondary | ICD-10-CM

## 2014-01-09 DIAGNOSIS — Z7901 Long term (current) use of anticoagulants: Secondary | ICD-10-CM

## 2014-01-09 DIAGNOSIS — F09 Unspecified mental disorder due to known physiological condition: Secondary | ICD-10-CM

## 2014-01-09 DIAGNOSIS — R5383 Other fatigue: Secondary | ICD-10-CM

## 2014-01-09 DIAGNOSIS — R4189 Other symptoms and signs involving cognitive functions and awareness: Secondary | ICD-10-CM

## 2014-01-09 DIAGNOSIS — E559 Vitamin D deficiency, unspecified: Secondary | ICD-10-CM

## 2014-01-09 LAB — COMPREHENSIVE METABOLIC PANEL
ALK PHOS: 47 U/L (ref 39–117)
ALT: 18 U/L (ref 0–35)
AST: 24 U/L (ref 0–37)
Albumin: 3.8 g/dL (ref 3.5–5.2)
BILIRUBIN TOTAL: 0.6 mg/dL (ref 0.3–1.2)
BUN: 22 mg/dL (ref 6–23)
CO2: 31 mEq/L (ref 19–32)
Calcium: 9.6 mg/dL (ref 8.4–10.5)
Chloride: 101 mEq/L (ref 96–112)
Creatinine, Ser: 1.3 mg/dL — ABNORMAL HIGH (ref 0.4–1.2)
GFR: 40.38 mL/min — ABNORMAL LOW (ref >60.00)
GLUCOSE: 85 mg/dL (ref 70–99)
Potassium: 4 mEq/L (ref 3.5–5.1)
SODIUM: 139 meq/L (ref 135–145)
TOTAL PROTEIN: 7.2 g/dL (ref 6.0–8.3)

## 2014-01-09 LAB — TSH: TSH: 0.56 u[IU]/mL (ref 0.35–5.50)

## 2014-01-09 LAB — PROTIME-INR
INR: 2 ratio — AB (ref 0.8–1.0)
PROTHROMBIN TIME: 21.3 s — AB (ref 10.2–12.4)

## 2014-01-09 MED ORDER — ZOSTER VACCINE LIVE 19400 UNT/0.65ML ~~LOC~~ SOLR: 0.6500 mL | Freq: Once | SUBCUTANEOUS | Status: DC

## 2014-01-09 MED ORDER — TETANUS-DIPHTH-ACELL PERTUSSIS 5-2.5-18.5 LF-MCG/0.5 IM SUSP: 0.5000 mL | Freq: Once | INTRAMUSCULAR | Status: DC

## 2014-01-09 NOTE — Assessment & Plan Note (Signed)
She scored 21 on a MMSE exam adminstered previously by a Home Health RN .   the etiology of hers was vascular, not Alzheimers.  She is having no trouble at home currently taking her medications since she uses a pill box.

## 2014-01-09 NOTE — Progress Notes (Signed)
Pre-visit discussion using our clinic review tool. No additional management support is needed unless otherwise documented below in the visit note.  

## 2014-01-09 NOTE — Assessment & Plan Note (Signed)
Elevated because of anxiety  Over  medication mix up by the pharmacy this morning.  Taking her lisinopril and metoprolol.  Checks her home bp  Regularly and home bps have been 120 to 168.  May need to increase her lisinopril

## 2014-01-09 NOTE — Assessment & Plan Note (Signed)
Her blood pressure is elevated today but she insists that it is due to the aggravation caused by the pharmacy. I instructed her to increase her lisinopril to 40 mg daily if her home blood pressures continue and measure above 140 systolic.

## 2014-01-09 NOTE — Patient Instructions (Addendum)
Your blood pressure is elevated today.  I would like you to increase your lisinopril to 40 mg daily  If your blood pressure at home remains above 140 most of the tme on current regimen.  You received the new pneumona vaccine today   Your still need your tetanus-diptheria-pertussis vaccine (TDaP) and your singles vaccinesbut you can get it for less $$$ at a local pharmacy with the script I have provided you.   Managing Your High Blood Pressure Blood pressure is a measurement of how forceful your blood is pressing against the walls of the arteries. Arteries are muscular tubes within the circulatory system. Blood pressure does not stay the same. Blood pressure rises when you are active, excited, or nervous; and it lowers during sleep and relaxation. If the numbers measuring your blood pressure stay above normal most of the time, you are at risk for health problems. High blood pressure (hypertension) is a long-term (chronic) condition in which blood pressure is elevated. A blood pressure reading is recorded as two numbers, such as 120 over 80 (or 120/80). The first, higher number is called the systolic pressure. It is a measure of the pressure in your arteries as the heart beats. The second, lower number is called the diastolic pressure. It is a measure of the pressure in your arteries as the heart relaxes between beats.  Keeping your blood pressure in a normal range is important to your overall health and prevention of health problems, such as heart disease and stroke. When your blood pressure is uncontrolled, your heart has to work harder than normal. High blood pressure is a very common condition in adults because blood pressure tends to rise with age. Men and women are equally likely to have hypertension but at different times in life. Before age 78, men are more likely to have hypertension. After 78 years of age, women are more likely to have it. Hypertension is especially common in African Americans. This  condition often has no signs or symptoms. The cause of the condition is usually not known. Your caregiver can help you come up with a plan to keep your blood pressure in a normal, healthy range. BLOOD PRESSURE STAGES Blood pressure is classified into four stages: normal, prehypertension, stage 1, and stage 2. Your blood pressure reading will be used to determine what type of treatment, if any, is necessary. Appropriate treatment options are tied to these four stages:  Normal  Systolic pressure (mm Hg): below 120.  Diastolic pressure (mm Hg): below 80. Prehypertension  Systolic pressure (mm Hg): 120 to 139.  Diastolic pressure (mm Hg): 80 to 89. Stage1  Systolic pressure (mm Hg): 140 to 159.  Diastolic pressure (mm Hg): 90 to 99. Stage2  Systolic pressure (mm Hg): 160 or above.  Diastolic pressure (mm Hg): 100 or above. RISKS RELATED TO HIGH BLOOD PRESSURE Managing your blood pressure is an important responsibility. Uncontrolled high blood pressure can lead to:  A heart attack.  A stroke.  A weakened blood vessel (aneurysm).  Heart failure.  Kidney damage.  Eye damage.  Metabolic syndrome.  Memory and concentration problems. HOW TO MANAGE YOUR BLOOD PRESSURE Blood pressure can be managed effectively with lifestyle changes and medicines (if needed). Your caregiver will help you come up with a plan to bring your blood pressure within a normal range. Your plan should include the following: Education  Read all information provided by your caregivers about how to control blood pressure.  Educate yourself on the latest guidelines and  treatment recommendations. New research is always being done to further define the risks and treatments for high blood pressure. Lifestylechanges  Control your weight.  Avoid smoking.  Stay physically active.  Reduce the amount of salt in your diet.  Reduce stress.  Control any chronic conditions, such as high cholesterol or  diabetes.  Reduce your alcohol intake. Medicines  Several medicines (antihypertensive medicines) are available, if needed, to bring blood pressure within a normal range. Communication  Review all the medicines you take with your caregiver because there may be side effects or interactions.  Talk with your caregiver about your diet, exercise habits, and other lifestyle factors that may be contributing to high blood pressure.  See your caregiver regularly. Your caregiver can help you create and adjust your plan for managing high blood pressure. RECOMMENDATIONS FOR TREATMENT AND FOLLOW-UP  The following recommendations are based on current guidelines for managing high blood pressure in nonpregnant adults. Use these recommendations to identify the proper follow-up period or treatment option based on your blood pressure reading. You can discuss these options with your caregiver.  Systolic pressure of 120 to 139 or diastolic pressure of 80 to 89: Follow up with your caregiver as directed.  Systolic pressure of 140 to 160 or diastolic pressure of 90 to 100: Follow up with your caregiver within 2 months.  Systolic pressure above 160 or diastolic pressure above 100: Follow up with your caregiver within 1 month.  Systolic pressure above 180 or diastolic pressure above 110: Consider antihypertensive therapy; follow up with your caregiver within 1 week.  Systolic pressure above 200 or diastolic pressure above 120: Begin antihypertensive therapy; follow up with your caregiver within 1 week. Document Released: 08/31/2012 Document Reviewed: 08/31/2012 [facility] Patient Information 2014 [place], [place].

## 2014-01-09 NOTE — Assessment & Plan Note (Signed)
She has a history of coagulopathy induced by alcohol abuse. She has not had any episodes of super therapeutic INR since I advised her to quit drinking alcohol completely

## 2014-01-09 NOTE — Progress Notes (Signed)
Patient ID: Latoya Merritt, female   DOB: 10/21/1920, 80 y.o.   MRN: [id number]   Patient Active Problem List   Diagnosis Date Noted  . History of non-ST elevation myocardial infarction (NSTEMI) 06/11/2013  . Bradycardia, sinus 12/22/2012  . Coronary artery disease   . Warfarin-induced coagulopathy 09/21/2012  . Cognitive deficits 12/31/2011  . History of CVA (cerebrovascular accident) 12/31/2011  . Neck pain 10/26/2011  . Hyperlipidemia   . Hypertension   . Colon polyp   . History of DVT of lower extremity   . HTN (hypertension) 04/13/2011  . ATRIAL FIBRILLATION 08/27/2010  . CHEST PAIN 08/27/2010  . ABNORMAL CV (STRESS) TEST 08/27/2010  . DVT (deep venous thrombosis) 04/20/2006    Subjective:  CC:   Chief Complaint  Patient presents with  . Follow-up    1 month follow up    HPI:   Latoya Merritt a 80 y.o. female who presents Follow up on chronic problems, including atrial fibrillation, hypertension,  Hyperlipidemia, cognitive deficits, warfarin-induced coagulopathy,  coronary artery disease and history of DVT    She has been having persistent Scalp pain on left parietal area, which has been waking her up at night. It has been present for several weeks. She has had prior falls and thinks that she may have hit her head on the nightstand during one of her prior falls. The scalp pain is aggravated by the direction of her hair orientation. She has not tried using a statin pillow. She is using Tylenol for pain control.  She checks her blood pressure at home and states that her most of her readings have been below 017 systolic and above 494. She is aggravated and anxious today because the pharmacy and refilled all of her recurrent prescriptions at [facility] requested now. She is due for an INR.   Past Medical History  Diagnosis Date  . Atrial fibrillation   . Polyp, stomach   . Colon polyp 2008  . Sensorineural hearing loss 2006    sudden, left ear  . DVT (deep venous  thrombosis) May 2007  . History of DVT of lower extremity May 2007  . Hyperlipidemia   . Hypertension   . Stroke   . Coronary artery disease     Past Surgical History  Procedure Laterality Date  . Laser surgery for varicose veins    . Nasal polyp surgery  2008    Dr. Tami Ribas, has been horase ever since  . Breast surgery  1972    mastectomies, presumed due to breast CA  . Abdominal hysterectomy  1965  . Appendectomy  1950  . Tonsillectomy  1943  . Laminectomy  1975  . Thyroid surgery  1977    nodule removed       The following portions of the patient's history were reviewed and updated as appropriate: Allergies, current medications, and problem list.    Review of Systems:   12 Pt  review of systems was negative except those addressed in the HPI,     History   Social History  . Marital Status: Married    Spouse Name: N/A    Number of Children: N/A  . Years of Education: N/A   Occupational History  . Not on file.   Social History Main Topics  . Smoking status: Never Smoker   . Smokeless tobacco: Never Used  . Alcohol Use: No     Comment: glass a wine at bedtime  . Drug Use: No  . Sexual Activity:  No   Other Topics Concern  . Not on file   Social History Narrative  . No narrative on file    Objective:  Filed Vitals:   01/09/14 1208  BP: 164/72  Pulse: 57  Temp: 97.6 F (36.4 C)  Resp: 14     General appearance: alert, cooperative and appears stated age Scalp: No contusions bruising or hematomas. Ears: normal TM's and external ear canals both ears Throat: lips, mucosa, and tongue normal; teeth and gums normal Neck: no adenopathy, no carotid bruit, supple, symmetrical, trachea midline and thyroid not enlarged, symmetric, no tenderness/mass/nodules Back: symmetric, no curvature. ROM normal. No CVA tenderness. Lungs: clear to auscultation bilaterally Heart: regular rate and rhythm, S1, S2 normal, no murmur, click, rub or gallop Abdomen: soft,  non-tender; bowel sounds normal; no masses,  no organomegaly Pulses: 2+ and symmetric Skin: Skin color, texture, turgor normal. No rashes or lesions Lymph nodes: Cervical, supraclavicular, and axillary nodes normal.  Assessment and Plan:  Hypertension Elevated because of anxiety  Over  medication mix up by the pharmacy this morning.  Taking her lisinopril and metoprolol.  Checks her home bp  Regularly and home bps have been 120 to 168.  May need to increase her lisinopril  ATRIAL FIBRILLATION She is currently her rate controlled and anticoagulated with warfarin for medication of embolic stroke risk. She has not had any recent falls. INR today is within range. No changes.  Warfarin-induced coagulopathy She has a history of coagulopathy induced by alcohol abuse. She has not had any episodes of super therapeutic INR since I advised her to quit drinking alcohol completely  HTN (hypertension) Her blood pressure is elevated today but she insists that it is due to the aggravation caused by the pharmacy. I instructed her to increase her lisinopril to 40 mg daily if her home blood pressures continue and measure above 503 systolic.  Cognitive deficits She scored 21 on a MMSE exam adminstered previously by a Home Health RN .   the etiology of hers was vascular, not Alzheimers.  She is having no trouble at home currently taking her medications since she uses a pill box.       Updated Medication List Outpatient Encounter Prescriptions as of 01/09/2014  Medication Sig  . acetaminophen (TYLENOL) 500 MG tablet Take 500 mg by mouth every 6 (six) hours as needed for pain.  . calcium carbonate (OS-CAL) 600 MG TABS Take 600 mg by mouth daily.    . Calcium Carbonate-Simethicone (MAALOX ADVANCED MAX ST) 1000-60 MG CHEW Chew 1 tablet by mouth as needed.    . Carboxymethylcellul-Glycerin (REFRESH OPTIVE OP) Apply to eye.  . carboxymethylcellulose (REFRESH PLUS) 0.5 % SOLN 1 drop 3 (three) times daily as  needed.  . indapamide (LOZOL) 2.5 MG tablet Take 2.5 mg by mouth every morning.  Marland Kitchen ipratropium (ATROVENT) 0.06 % nasal spray Place 2 sprays into the nose daily.  . isosorbide mononitrate (IMDUR) 60 MG 24 hr tablet TAKE 1 TABLET BY MOUTH EVERY DAY  . KLOR-CON 10 10 MEQ tablet TAKE 1 TABLET BY MOUTH TWICE DAILY.  Marland Kitchen lisinopril (PRINIVIL,ZESTRIL) 20 MG tablet TAKE 1 TABLET BY MOUTH EVERY DAY  . metoprolol tartrate (LOPRESSOR) 25 MG tablet Take 0.5 tablets (12.5 mg total) by mouth 2 (two) times daily.  . nitroGLYCERIN (NITROSTAT) 0.4 MG SL tablet Place 1 tablet (0.4 mg total) under the tongue every 5 (five) minutes as needed.  . warfarin (COUMADIN) 3 MG tablet TAKE 1 TABLET BY MOUTH EVERY DAY.  Marland Kitchen  Tdap (BOOSTRIX) 5-2.5-18.5 LF-MCG/0.5 injection Inject 0.5 mLs into the muscle once.  . zoster vaccine live, PF, (ZOSTAVAX) 81275 UNT/0.65ML injection Inject 19,400 Units into the skin once.     Orders Placed This Encounter  Procedures  . Comp Met (CMET)  . Protime-INR  . TSH  . Vit D  25 hydroxy (rtn osteoporosis monitoring)    No Follow-up on file.

## 2014-01-09 NOTE — Assessment & Plan Note (Signed)
She is currently her rate controlled and anticoagulated with warfarin for medication of embolic stroke risk. She has not had any recent falls. INR today is within range. No changes.

## 2014-01-10 LAB — VITAMIN D 25 HYDROXY (VIT D DEFICIENCY, FRACTURES): Vit D, 25-Hydroxy: 45 ng/mL (ref 30–89)

## 2014-01-11 ENCOUNTER — Encounter: Payer: Self-pay | Admitting: Cardiovascular Disease

## 2014-01-11 ENCOUNTER — Ambulatory Visit (INDEPENDENT_AMBULATORY_CARE_PROVIDER_SITE_OTHER): Payer: 59 | Admitting: Cardiovascular Disease

## 2014-01-11 VITALS — BP 171/76 | HR 61 | Ht 66.0 in | Wt 134.2 lb

## 2014-01-11 DIAGNOSIS — I2510 Atherosclerotic heart disease of native coronary artery without angina pectoris: Secondary | ICD-10-CM

## 2014-01-11 DIAGNOSIS — I4891 Unspecified atrial fibrillation: Secondary | ICD-10-CM

## 2014-01-11 DIAGNOSIS — R001 Bradycardia, unspecified: Secondary | ICD-10-CM

## 2014-01-11 DIAGNOSIS — I498 Other specified cardiac arrhythmias: Secondary | ICD-10-CM

## 2014-01-11 DIAGNOSIS — I10 Essential (primary) hypertension: Secondary | ICD-10-CM

## 2014-01-11 NOTE — Patient Instructions (Signed)
Continue same medications.   Your physician wants you to follow-up in: 6 months.  You will receive a reminder letter in the mail two months in advance. If you don't receive a letter, please call our office to schedule the follow-up appointment.  

## 2014-01-11 NOTE — Addendum Note (Signed)
Addended by: Dennie BibleAVIS, KATHY R on: 01/11/2014 04:30 PM   Modules accepted: Orders

## 2014-01-11 NOTE — Progress Notes (Signed)
HPI  [name] is a very pleasant 80 year old woman with a history of permanent atrial fibrillation on warfarin, hyperlipidemia with notes indicating elevated liver function tests on statins, history of DVT, hypertension, bilateral mastectomy, presenting for routine followup.  She was hospitalized in November, 2013 for left-sided weakness with suspected stroke. She was off warfarin at that time due to significant left arm hematoma after a fall. She did have a non-ST elevation MI with peak troponin 2.5 felt secondary to demand ischemia. No cardiac workup was performed apart from echocardiogram. Echocardiogram showed normal LV systolic function. Warfarin was resumed. She reports no adverse events since then.  She denies any chest pain, shortness of breath or palpitations.   Allergies  Allergen Reactions  . Amoxicillin     rash  . Amoxicillin-Pot Clavulanate     Swelling & rash  . Cephalexin     rash  . Cephalexin   . Clarithromycin     rash  . Clindamycin/Lincomycin     rash  . Lansoprazole   . Macrodantin   . Sulfa Antibiotics   . Tussionex Pennkinetic Er [Hydrocod Polst-Cpm Polst Er]     Rash across her chest and side     Current Outpatient Prescriptions on File Prior to Visit  Medication Sig Dispense Refill  . acetaminophen (TYLENOL) 500 MG tablet Take 500 mg by mouth every 6 (six) hours as needed for pain.      . calcium carbonate (OS-CAL) 600 MG TABS Take 600 mg by mouth daily.        . Calcium Carbonate-Simethicone (MAALOX ADVANCED MAX ST) 1000-60 MG CHEW Chew 1 tablet by mouth as needed.        . Carboxymethylcellul-Glycerin (REFRESH OPTIVE OP) Apply to eye.      . carboxymethylcellulose (REFRESH PLUS) 0.5 % SOLN 1 drop 3 (three) times daily as needed.      . indapamide (LOZOL) 2.5 MG tablet Take 2.5 mg by mouth every morning.      Marland Kitchen. ipratropium (ATROVENT) 0.06 % nasal spray Place 2 sprays into the nose daily.      . isosorbide mononitrate (IMDUR) 60 MG 24 hr tablet  TAKE 1 TABLET BY MOUTH EVERY DAY  30 tablet  5  . KLOR-CON 10 10 MEQ tablet TAKE 1 TABLET BY MOUTH TWICE DAILY.  60 tablet  0  . lisinopril (PRINIVIL,ZESTRIL) 20 MG tablet TAKE 1 TABLET BY MOUTH EVERY DAY  30 tablet  5  . metoprolol tartrate (LOPRESSOR) 25 MG tablet Take 0.5 tablets (12.5 mg total) by mouth 2 (two) times daily.  60 tablet  6  . nitroGLYCERIN (NITROSTAT) 0.4 MG SL tablet Place 1 tablet (0.4 mg total) under the tongue every 5 (five) minutes as needed.  90 tablet  2  . Tdap (BOOSTRIX) 5-2.5-18.5 LF-MCG/0.5 injection Inject 0.5 mLs into the muscle once.  0.5 mL  0  . warfarin (COUMADIN) 3 MG tablet TAKE 1 TABLET BY MOUTH EVERY DAY.  30 tablet  0  . zoster vaccine live, PF, (ZOSTAVAX) [id number] UNT/0.65ML injection Inject 19,400 Units into the skin once.  1 each  0   No current facility-administered medications on file prior to visit.     Past Medical History  Diagnosis Date  . Atrial fibrillation   . Polyp, stomach   . Colon polyp 2008  . Sensorineural hearing loss 2006    sudden, left ear  . DVT (deep venous thrombosis) May 2007  . History of DVT of lower extremity May 2007  .  Hyperlipidemia   . Hypertension   . Stroke   . Coronary artery disease      Past Surgical History  Procedure Laterality Date  . Laser surgery for varicose veins    . Nasal polyp surgery  2008    Dr. Jenne Campus, has been horase ever since  . Breast surgery  1972    mastectomies, presumed due to breast CA  . Abdominal hysterectomy  1965  . Appendectomy  1950  . Tonsillectomy  1943  . Laminectomy  1975  . Thyroid surgery  1977    nodule removed     Family History  Problem Relation Age of Onset  . Cancer Other   . Heart failure Mother   . Heart failure Brother      History   Social History  . Marital Status: Married    Spouse Name: N/A    Number of Children: N/A  . Years of Education: N/A   Occupational History  . Not on file.   Social History Main Topics  . Smoking status:  Never Smoker   . Smokeless tobacco: Never Used  . Alcohol Use: No     Comment: glass a wine at bedtime  . Drug Use: No  . Sexual Activity: No   Other Topics Concern  . Not on file   Social History Narrative  . No narrative on file     PHYSICAL EXAM   BP 171/76  Pulse 61  Ht 5\' 6"  (1.676 m)  Wt 134 lb 4 oz (60.895 kg)  BMI 21.68 kg/m2 Constitutional: She is oriented to person, place, and time. She appears well-developed and well-nourished. No distress.  HENT: No nasal discharge.  Head: Normocephalic and atraumatic.  Eyes: Pupils are equal and round. Right eye exhibits no discharge. Left eye exhibits no discharge.  Neck: Normal range of motion. Neck supple. No JVD present. No thyromegaly present.  Cardiovascular: Normal rate, irregular rhythm, normal heart sounds. Exam reveals no gallop and no friction rub. No murmur heard.  Pulmonary/Chest: Effort normal and breath sounds normal. No stridor. No respiratory distress. She has no wheezes. She has no rales. She exhibits no tenderness.  Abdominal: Soft. Bowel sounds are normal. She exhibits no distension. There is no tenderness. There is no rebound and no guarding.  Musculoskeletal: Normal range of motion. She exhibits no edema and no tenderness.  Neurological: She is alert and oriented to person, place, and time. Coordination normal.  Skin: Skin is warm and dry. No rash noted. She is not diaphoretic. No erythema. No pallor.  Psychiatric: She has a normal mood and affect. Her behavior is normal. Judgment and thought content normal.    EKG: Atrial fibrillation with a rate of 61 beats per minute -Old anterior infarct.   ABNORMAL     ASSESSMENT AND PLAN

## 2014-01-12 NOTE — Assessment & Plan Note (Signed)
Blood pressure is elevated today. She reports being under stress due to conflict with her neighbor.

## 2014-01-12 NOTE — Assessment & Plan Note (Signed)
The patient likely has underlying coronary artery disease based on the results of a previous stress test as well as previous small non-ST elevation myocardial infarction. However, I have recommended medical therapy due to lack of anginal symptoms.

## 2014-01-12 NOTE — Assessment & Plan Note (Signed)
Ventricular rate is well controlled on small dose metoprolol. She is tolerating anticoagulation with warfarin. Continue current medications.

## 2014-01-24 ENCOUNTER — Telehealth: Payer: Self-pay | Admitting: Internal Medicine

## 2014-01-24 NOTE — Telephone Encounter (Signed)
Relevant patient education mailed to patient.  

## 2014-01-31 ENCOUNTER — Other Ambulatory Visit: Payer: Self-pay | Admitting: Internal Medicine

## 2014-02-03 ENCOUNTER — Other Ambulatory Visit: Payer: Self-pay | Admitting: Internal Medicine

## 2014-02-06 ENCOUNTER — Other Ambulatory Visit: Payer: 59

## 2014-02-11 ENCOUNTER — Other Ambulatory Visit: Payer: Self-pay | Admitting: Cardiovascular Disease

## 2014-02-13 ENCOUNTER — Other Ambulatory Visit: Payer: 59

## 2014-02-27 ENCOUNTER — Other Ambulatory Visit: Payer: Self-pay | Admitting: Internal Medicine

## 2014-02-27 ENCOUNTER — Other Ambulatory Visit (INDEPENDENT_AMBULATORY_CARE_PROVIDER_SITE_OTHER): Payer: 59

## 2014-02-27 DIAGNOSIS — Z7901 Long term (current) use of anticoagulants: Secondary | ICD-10-CM

## 2014-02-27 NOTE — Telephone Encounter (Signed)
indapamide (LOZOL) 2.5 MG tablet

## 2014-02-27 NOTE — Telephone Encounter (Signed)
Refill sent.

## 2014-02-28 LAB — PROTIME-INR
INR: 2.5 — AB (ref 0.8–1.2)
Prothrombin Time: 25.5 s — ABNORMAL HIGH (ref 9.1–12.0)

## 2014-03-27 ENCOUNTER — Other Ambulatory Visit (INDEPENDENT_AMBULATORY_CARE_PROVIDER_SITE_OTHER): Payer: 59

## 2014-03-27 DIAGNOSIS — Z7901 Long term (current) use of anticoagulants: Secondary | ICD-10-CM

## 2014-03-28 LAB — PROTIME-INR
INR: 2.5 — ABNORMAL HIGH (ref 0.8–1.2)
PROTHROMBIN TIME: 26 s — AB (ref 9.1–12.0)

## 2014-03-29 ENCOUNTER — Other Ambulatory Visit: Payer: Self-pay | Admitting: Internal Medicine

## 2014-04-17 ENCOUNTER — Ambulatory Visit (INDEPENDENT_AMBULATORY_CARE_PROVIDER_SITE_OTHER): Payer: 59 | Admitting: Internal Medicine

## 2014-04-17 ENCOUNTER — Encounter: Payer: Self-pay | Admitting: Internal Medicine

## 2014-04-17 ENCOUNTER — Other Ambulatory Visit: Payer: Self-pay | Admitting: Cardiovascular Disease

## 2014-04-17 VITALS — BP 150/80 | HR 56 | Temp 98.4°F | Resp 16 | Wt 134.5 lb

## 2014-04-17 DIAGNOSIS — I10 Essential (primary) hypertension: Secondary | ICD-10-CM

## 2014-04-17 DIAGNOSIS — I4891 Unspecified atrial fibrillation: Secondary | ICD-10-CM

## 2014-04-17 DIAGNOSIS — F09 Unspecified mental disorder due to known physiological condition: Secondary | ICD-10-CM

## 2014-04-17 DIAGNOSIS — R4189 Other symptoms and signs involving cognitive functions and awareness: Secondary | ICD-10-CM

## 2014-04-17 DIAGNOSIS — I82409 Acute embolism and thrombosis of unspecified deep veins of unspecified lower extremity: Secondary | ICD-10-CM

## 2014-04-17 MED ORDER — LISINOPRIL 40 MG PO TABS: 40.0000 mg | ORAL_TABLET | Freq: Every day | ORAL | Status: DC

## 2014-04-17 NOTE — Progress Notes (Signed)
Pre-visit discussion using our clinic review tool. No additional management support is needed unless otherwise documented below in the visit note.  

## 2014-04-17 NOTE — Progress Notes (Signed)
Patient ID: Latoya Merritt, female   DOB: 03/17/1920, 80 y.o.   MRN: [id number]   Patient Active Problem List   Diagnosis Date Noted  . History of non-ST elevation myocardial infarction (NSTEMI) 06/11/2013  . Bradycardia, sinus 12/22/2012  . Coronary artery disease   . Warfarin-induced coagulopathy 09/21/2012  . Cognitive deficits 12/31/2011  . History of CVA (cerebrovascular accident) 12/31/2011  . Neck pain 10/26/2011  . Hyperlipidemia   . Hypertension   . Colon polyp   . History of DVT of lower extremity   . HTN (hypertension) 04/13/2011  . ATRIAL FIBRILLATION 08/27/2010  . CHEST PAIN 08/27/2010  . ABNORMAL CV (STRESS) TEST 08/27/2010  . DVT (deep venous thrombosis) 04/20/2006    Subjective:  CC:   Chief Complaint  Patient presents with  . Follow-up    3 month / patient concerned about having a hematoma in left arm  . Hypertension    seen 01/09/14.     HPI:   Latoya Commanderellie G Landowski is a 80 y.o. female who presents for Follow up on multiple issues,  Including atrial fibrillation,  Hypertension and  History of CVA.   Saw Arida in January no med changes.  6 month follow up planned.    Taking calcium carbonate   Had a split molar recently during a dental cleaning,  Has changed dentists bc she balmes the dentist  Going to see Dr Tiburcio PeaHarris, regular dentist  on [address]   Teeth are chipping.    Past Medical History  Diagnosis Date  . Atrial fibrillation   . Polyp, stomach   . Colon polyp 2008  . Sensorineural hearing loss 2006    sudden, left ear  . DVT (deep venous thrombosis) May 2007  . History of DVT of lower extremity May 2007  . Hyperlipidemia   . Hypertension   . Stroke   . Coronary artery disease     Past Surgical History  Procedure Laterality Date  . Laser surgery for varicose veins    . Nasal polyp surgery  2008    Dr. Jenne CampusMcQueen, has been horase ever since  . Breast surgery  1972    mastectomies, presumed due to breast CA  . Abdominal hysterectomy  1965   . Appendectomy  1950  . Tonsillectomy  1943  . Laminectomy  1975  . Thyroid surgery  1977    nodule removed    The following portions of the patient's history were reviewed and updated as appropriate: Allergies, current medications, and problem list.    Review of Systems:   Patient denies headache, fevers, malaise, unintentional weight loss, skin rash, eye pain, sinus congestion and sinus pain, sore throat, dysphagia,  hemoptysis , cough, dyspnea, wheezing, chest pain, palpitations, orthopnea, edema, abdominal pain, nausea, melena, diarrhea, constipation, flank pain, dysuria, hematuria, urinary  Frequency, nocturia, numbness, tingling, seizures,  Focal weakness, Loss of consciousness,  Tremor, insomnia, depression, anxiety, and suicidal ideation.     History   Social History  . Marital Status: Married    Spouse Name: N/A    Number of Children: N/A  . Years of Education: N/A   Occupational History  . Not on file.   Social History Main Topics  . Smoking status: Never Smoker   . Smokeless tobacco: Never Used  . Alcohol Use: No     Comment: glass a wine at bedtime  . Drug Use: No  . Sexual Activity: No   Other Topics Concern  . Not on file  Social History Narrative  . No narrative on file    Objective:  Filed Vitals:   04/17/14 1408  BP: 150/80  Pulse: 56  Temp: 98.4 F (36.9 C)  Resp: 16     General appearance: alert, cooperative and appears stated age Ears: normal TM's and external ear canals both ears Throat: lips, mucosa, and tongue normal; teeth and gums normal Neck: no adenopathy, no carotid bruit, supple, symmetrical, trachea midline and thyroid not enlarged, symmetric, no tenderness/mass/nodules Back: symmetric, no curvature. ROM normal. No CVA tenderness. Lungs: clear to auscultation bilaterally Heart: regular rate and rhythm, S1, S2 normal, no murmur, click, rub or gallop Abdomen: soft, non-tender; bowel sounds normal; no masses,  no  organomegaly Pulses: 2+ and symmetric Skin: Skin color, texture, turgor normal. No rashes or lesions Lymph nodes: Cervical, supraclavicular, and axillary nodes normal.  Assessment and Plan:  ATRIAL FIBRILLATION Well controlled on current regimen.  no changes today.  Cognitive deficits She is managing her own medications despite my advice.but has a caregiver with her during rhe day time 5 days per week   DVT (deep venous thrombosis) She is concerned about an occurrence but has no sign of one on  exam   Hypertension Elevated today.  I have increased lisinopril to 40 mg daily today   Updated Medication List Outpatient Encounter Prescriptions as of 04/17/2014  Medication Sig  . acetaminophen (TYLENOL) 500 MG tablet Take 500 mg by mouth every 6 (six) hours as needed for pain.  . calcium carbonate (OS-CAL) 600 MG TABS Take 600 mg by mouth daily.    . Calcium Carbonate-Simethicone (MAALOX ADVANCED MAX ST) 1000-60 MG CHEW Chew 1 tablet by mouth as needed.    . Carboxymethylcellul-Glycerin (REFRESH OPTIVE OP) Apply to eye.  . carboxymethylcellulose (REFRESH PLUS) 0.5 % SOLN 1 drop 3 (three) times daily as needed.  . indapamide (LOZOL) 2.5 MG tablet Take 2.5 mg by mouth every morning.  . indapamide (LOZOL) 2.5 MG tablet TAKE 1 TABLET BY MOUTH EVERY MORNING  . ipratropium (ATROVENT) 0.06 % nasal spray Place 2 sprays into the nose daily.  . isosorbide mononitrate (IMDUR) 60 MG 24 hr tablet TAKE 1 TABLET BY MOUTH EVERY DAY  . lisinopril (PRINIVIL,ZESTRIL) 40 MG tablet Take 1 tablet (40 mg total) by mouth daily.  . metoprolol tartrate (LOPRESSOR) 25 MG tablet TAKE ONE-HALF TABLET BY MOUTH TWICE DAILY  . nitroGLYCERIN (NITROSTAT) 0.4 MG SL tablet Place 1 tablet (0.4 mg total) under the tongue every 5 (five) minutes as needed.  . potassium chloride (K-DUR) 10 MEQ tablet TAKE 1 TABLET BY MOUTH TWICE DAILY  . Tdap (BOOSTRIX) 5-2.5-18.5 LF-MCG/0.5 injection Inject 0.5 mLs into the muscle once.  .  warfarin (COUMADIN) 3 MG tablet TAKE 1 TABLET BY MOUTH EVERY DAY  . zoster vaccine live, PF, (ZOSTAVAX) [id number] UNT/0.65ML injection Inject 19,400 Units into the skin once.  . [DISCONTINUED] lisinopril (PRINIVIL,ZESTRIL) 20 MG tablet TAKE ONE TABLET BY MOUTH EVERY DAY     No orders of the defined types were placed in this encounter.    Return in about 3 months (around 07/17/2014).

## 2014-04-17 NOTE — Patient Instructions (Addendum)
You blood pressure is still too high  I want you to increase your  lisinopril to 40 mg daily starting tomorrow.  You can use your old 20 mg tablets and just take 2 dailly until you are out of them   If you want add calcium to your diet without adding cholesterol.,  Try using almond/coconut milk instead of whole milk   CONTINUE EATING A HEALTHY DIET !! DONT' GIVE UP VEGETABLES

## 2014-04-17 NOTE — Progress Notes (Signed)
Patient C/O left arm at bicep area when she contracts bicep is a hematoma.

## 2014-04-18 NOTE — Assessment & Plan Note (Signed)
She is concerned about an occurrence but has no sign of one on  exam

## 2014-04-18 NOTE — Assessment & Plan Note (Signed)
Well controlled on current regimen.  no changes today.   

## 2014-04-18 NOTE — Assessment & Plan Note (Signed)
Elevated today.  I have increased lisinopril to 40 mg daily today

## 2014-04-18 NOTE — Assessment & Plan Note (Signed)
She is managing her own medications despite my advice.but has a caregiver with her during rhe day time 5 days per week

## 2014-04-24 ENCOUNTER — Other Ambulatory Visit: Payer: Self-pay | Admitting: Internal Medicine

## 2014-04-24 ENCOUNTER — Other Ambulatory Visit (INDEPENDENT_AMBULATORY_CARE_PROVIDER_SITE_OTHER): Payer: 59

## 2014-04-24 DIAGNOSIS — Z7901 Long term (current) use of anticoagulants: Secondary | ICD-10-CM

## 2014-04-25 LAB — PROTIME-INR
INR: 1.9 — ABNORMAL HIGH (ref 0.8–1.2)
Prothrombin Time: 19.9 s — ABNORMAL HIGH (ref 9.1–12.0)

## 2014-04-25 LAB — PLEASE NOTE

## 2014-04-26 ENCOUNTER — Other Ambulatory Visit: Payer: Self-pay | Admitting: Internal Medicine

## 2014-05-22 ENCOUNTER — Other Ambulatory Visit (INDEPENDENT_AMBULATORY_CARE_PROVIDER_SITE_OTHER): Payer: 59

## 2014-05-22 DIAGNOSIS — Z7901 Long term (current) use of anticoagulants: Secondary | ICD-10-CM

## 2014-05-23 LAB — PROTIME-INR
INR: 2.2 — ABNORMAL HIGH (ref 0.8–1.2)
PROTHROMBIN TIME: 23 s — AB (ref 9.1–12.0)

## 2014-05-25 NOTE — Progress Notes (Signed)
Patient called back to office to state that concerned as to which medication to stop confirmed to patient to stop metoprolol as instructed patient reported BP 128/78 HR 42.

## 2014-05-28 ENCOUNTER — Telehealth: Payer: Self-pay | Admitting: *Deleted

## 2014-05-28 DIAGNOSIS — R5381 Other malaise: Secondary | ICD-10-CM

## 2014-05-28 DIAGNOSIS — R5383 Other fatigue: Principal | ICD-10-CM

## 2014-05-28 NOTE — Telephone Encounter (Signed)
Pt left VM, stopped Metoprolol and BP 146/71 HR 56. Left VM requesting call back to let us know if fatigue has improved

## 2014-05-28 NOTE — Telephone Encounter (Signed)
Spoke with pt, states no improvement in fatigue. Gave another reading from an hour ago 134/77, HR 70

## 2014-05-29 ENCOUNTER — Other Ambulatory Visit (INDEPENDENT_AMBULATORY_CARE_PROVIDER_SITE_OTHER): Payer: 59

## 2014-05-29 DIAGNOSIS — R5383 Other fatigue: Principal | ICD-10-CM

## 2014-05-29 DIAGNOSIS — R5381 Other malaise: Secondary | ICD-10-CM

## 2014-05-29 LAB — CBC WITH DIFFERENTIAL/PLATELET
BASOS ABS: 0 10*3/uL (ref 0.0–0.1)
Basophils Relative: 0.5 % (ref 0.0–3.0)
Eosinophils Absolute: 0 10*3/uL (ref 0.0–0.7)
Eosinophils Relative: 0.6 % (ref 0.0–5.0)
HEMATOCRIT: 36.5 % (ref 36.0–46.0)
HEMOGLOBIN: 12.2 g/dL (ref 12.0–15.0)
Lymphocytes Relative: 43.9 % (ref 12.0–46.0)
Lymphs Abs: 2.6 10*3/uL (ref 0.7–4.0)
MCHC: 33.5 g/dL (ref 30.0–36.0)
MCV: 94.3 fl (ref 78.0–100.0)
MONO ABS: 0.4 10*3/uL (ref 0.1–1.0)
Monocytes Relative: 6.3 % (ref 3.0–12.0)
NEUTROS ABS: 2.9 10*3/uL (ref 1.4–7.7)
Neutrophils Relative %: 48.7 % (ref 43.0–77.0)
PLATELETS: 171 10*3/uL (ref 150.0–400.0)
RBC: 3.88 Mil/uL (ref 3.87–5.11)
RDW: 13.4 % (ref 11.5–15.5)
WBC: 6 10*3/uL (ref 4.0–10.5)

## 2014-05-29 LAB — COMPREHENSIVE METABOLIC PANEL
ALK PHOS: 43 U/L (ref 39–117)
ALT: 16 U/L (ref 0–35)
AST: 27 U/L (ref 0–37)
Albumin: 4 g/dL (ref 3.5–5.2)
BILIRUBIN TOTAL: 1 mg/dL (ref 0.2–1.2)
BUN: 14 mg/dL (ref 6–23)
CO2: 28 mEq/L (ref 19–32)
CREATININE: 1.2 mg/dL (ref 0.4–1.2)
Calcium: 9.7 mg/dL (ref 8.4–10.5)
Chloride: 99 mEq/L (ref 96–112)
GFR: 45.52 mL/min — ABNORMAL LOW (ref >60.00)
Glucose, Bld: 86 mg/dL (ref 70–99)
Potassium: 3.8 mEq/L (ref 3.5–5.1)
Sodium: 136 mEq/L (ref 135–145)
TOTAL PROTEIN: 6.6 g/dL (ref 6.0–8.3)

## 2014-05-29 LAB — TSH: TSH: 1.01 u[IU]/mL (ref 0.35–4.50)

## 2014-05-29 NOTE — Telephone Encounter (Signed)
Pt notified, lab appt scheduled for today. Already has appt with Dr. Darrick Huntsman 06/04/14

## 2014-05-29 NOTE — Telephone Encounter (Signed)
Have her come by for nonfasting labs to rule out anemia,  thyoird problems sometime this week and make follow up appt for next week with me

## 2014-05-31 ENCOUNTER — Encounter: Payer: Self-pay | Admitting: *Deleted

## 2014-06-04 ENCOUNTER — Encounter: Payer: Self-pay | Admitting: Internal Medicine

## 2014-06-04 ENCOUNTER — Ambulatory Visit (INDEPENDENT_AMBULATORY_CARE_PROVIDER_SITE_OTHER): Payer: 59 | Admitting: Internal Medicine

## 2014-06-04 VITALS — BP 128/68 | HR 87 | Temp 97.9°F | Resp 16 | Ht 66.0 in | Wt 131.5 lb

## 2014-06-04 DIAGNOSIS — G8929 Other chronic pain: Secondary | ICD-10-CM

## 2014-06-04 DIAGNOSIS — M25529 Pain in unspecified elbow: Secondary | ICD-10-CM

## 2014-06-04 DIAGNOSIS — M25522 Pain in left elbow: Secondary | ICD-10-CM

## 2014-06-04 NOTE — Progress Notes (Signed)
Patient ID: Latoya Merritt, female   DOB: 12/04/1920, 80 y.o.   MRN: [id number]  Patient Active Problem List   Diagnosis Date Noted  . Elbow pain, chronic 06/05/2014  . History of non-ST elevation myocardial infarction (NSTEMI) 06/11/2013  . Bradycardia, sinus 12/22/2012  . Coronary artery disease   . Warfarin-induced coagulopathy 09/21/2012  . Cognitive deficits 12/31/2011  . History of CVA (cerebrovascular accident) 12/31/2011  . Neck pain 10/26/2011  . Hyperlipidemia   . Hypertension   . Colon polyp   . History of DVT of lower extremity   . HTN (hypertension) 04/13/2011  . ATRIAL FIBRILLATION 08/27/2010  . CHEST PAIN 08/27/2010  . ABNORMAL CV (STRESS) TEST 08/27/2010  . DVT (deep venous thrombosis) 04/20/2006    Subjective:  CC:   Chief Complaint  Patient presents with  . Acute Visit    left elbow bone , is sharp pain sore to touch    HPI:   Latoya Commanderellie G Eckrich is a 80 y.o. female who presents for Left elbow pain.  Patient reports that for the past year, since she fell out of bed when she had a CVA,  She has had intermittent pain to tip of left elbow.   Hurts with direct pressure when elbow is flexed,  wonders if she has chipped the bone.  Using tylenol  For control of pain,  NSAID contra indicated due to use of coumadin.  Evaluation has not been done , there are no prior  X rays,.     Past Medical History  Diagnosis Date  . Atrial fibrillation   . Polyp, stomach   . Colon polyp 2008  . Sensorineural hearing loss 2006    sudden, left ear  . DVT (deep venous thrombosis) May 2007  . History of DVT of lower extremity May 2007  . Hyperlipidemia   . Hypertension   . Stroke   . Coronary artery disease     Past Surgical History  Procedure Laterality Date  . Laser surgery for varicose veins    . Nasal polyp surgery  2008    Dr. Jenne CampusMcQueen, has been horase ever since  . Breast surgery  1972    mastectomies, presumed due to breast CA  . Abdominal hysterectomy  1965  .  Appendectomy  1950  . Tonsillectomy  1943  . Laminectomy  1975  . Thyroid surgery  1977    nodule removed       The following portions of the patient's history were reviewed and updated as appropriate: Allergies, current medications, and problem list.    Review of Systems:   Patient denies headache, fevers, malaise, unintentional weight loss, skin rash, eye pain, sinus congestion and sinus pain, sore throat, dysphagia,  hemoptysis , cough, dyspnea, wheezing, chest pain, palpitations, orthopnea, edema, abdominal pain, nausea, melena, diarrhea, constipation, flank pain, dysuria, hematuria, urinary  Frequency, nocturia, numbness, tingling, seizures,  Focal weakness, Loss of consciousness,  Tremor, insomnia, depression, anxiety, and suicidal ideation.     History   Social History  . Marital Status: Married    Spouse Name: N/A    Number of Children: N/A  . Years of Education: N/A   Occupational History  . Not on file.   Social History Main Topics  . Smoking status: Never Smoker   . Smokeless tobacco: Never Used  . Alcohol Use: No     Comment: glass a wine at bedtime  . Drug Use: No  . Sexual Activity: No   Other Topics Concern  .  Not on file   Social History Narrative  . No narrative on file    Objective:  Filed Vitals:   06/04/14 1729  BP: 128/68  Pulse: 87  Temp: 97.9 F (36.6 C)  Resp: 16     General appearance: alert, cooperative and appears stated age Ears: normal TM's and external ear canals both ears Throat: lips, mucosa, and tongue normal; teeth and gums normal Neck: no adenopathy, no carotid bruit, supple, symmetrical, trachea midline and thyroid not enlarged, symmetric, no tenderness/mass/nodules Back: symmetric, no curvature. ROM normal. No CVA tenderness. Lungs: clear to auscultation bilaterally Heart: regular rate and rhythm, S1, S2 normal, no murmur, click, rub or gallop Abdomen: soft, non-tender; bowel sounds normal; no masses,  no  organomegaly Pulses: 2+ and symmetric Skin: Skin color, texture, turgor normal. No rashes or lesions Lymph nodes: Cervical, supraclavicular, and axillary nodes normal.  Assessment and Plan:  Elbow pain, chronic Plain films ordered.  Pain started after blunt trauma to elbow one year ago.  [facility]   Updated Medication List Outpatient Encounter Prescriptions as of 06/04/2014  Medication Sig  . acetaminophen (TYLENOL) 500 MG tablet Take 500 mg by mouth every 6 (six) hours as needed for pain.  . calcium carbonate (OS-CAL) 600 MG TABS Take 600 mg by mouth daily.    . Calcium Carbonate-Simethicone (MAALOX ADVANCED MAX ST) 1000-60 MG CHEW Chew 1 tablet by mouth as needed.    . Carboxymethylcellul-Glycerin (REFRESH OPTIVE OP) Apply to eye.  . carboxymethylcellulose (REFRESH PLUS) 0.5 % SOLN 1 drop 3 (three) times daily as needed.  . indapamide (LOZOL) 2.5 MG tablet Take 2.5 mg by mouth every morning.  . indapamide (LOZOL) 2.5 MG tablet TAKE 1 TABLET BY MOUTH EVERY MORNING  . ipratropium (ATROVENT) 0.06 % nasal spray Place 2 sprays into the nose daily.  . isosorbide mononitrate (IMDUR) 60 MG 24 hr tablet TAKE 1 TABLET BY MOUTH EVERY DAY  . lisinopril (PRINIVIL,ZESTRIL) 40 MG tablet Take 1 tablet (40 mg total) by mouth daily.  . metoprolol tartrate (LOPRESSOR) 25 MG tablet TAKE ONE-HALF TABLET BY MOUTH TWICE DAILY  . nitroGLYCERIN (NITROSTAT) 0.4 MG SL tablet Place 1 tablet (0.4 mg total) under the tongue every 5 (five) minutes as needed.  . potassium chloride (K-DUR) 10 MEQ tablet TAKE 1 TABLET BY MOUTH TWICE DAILY  . warfarin (COUMADIN) 3 MG tablet TAKE 1 TABLET BY MOUTH EVERY DAY  . Tdap (BOOSTRIX) 5-2.5-18.5 LF-MCG/0.5 injection Inject 0.5 mLs into the muscle once.  . zoster vaccine live, PF, (ZOSTAVAX) [id number] UNT/0.65ML injection Inject 19,400 Units into the skin once.     Orders Placed This Encounter  Procedures  . DG Elbow Complete Left    No Follow-up on  file.

## 2014-06-04 NOTE — Progress Notes (Signed)
Pre-visit discussion using our clinic review tool. No additional management support is needed unless otherwise documented below in the visit note.  

## 2014-06-04 NOTE — Patient Instructions (Signed)
[facility] left elbow [facility] at your convenience  Ok to continue using tylenol for relief of pain,  Maximum daily dose 6 tablets (3000 mg total)  Daily  You have lost 3 lbs!!  You need to eat more

## 2014-06-05 ENCOUNTER — Encounter: Payer: Self-pay | Admitting: Internal Medicine

## 2014-06-05 DIAGNOSIS — M25529 Pain in unspecified elbow: Secondary | ICD-10-CM

## 2014-06-05 DIAGNOSIS — G8929 Other chronic pain: Secondary | ICD-10-CM | POA: Insufficient documentation

## 2014-06-05 NOTE — Assessment & Plan Note (Signed)
Plain films ordered.  Pain started after blunt trauma to elbow one year ago.  [facility]

## 2014-06-07 ENCOUNTER — Ambulatory Visit: Payer: Self-pay | Admitting: Internal Medicine

## 2014-06-11 ENCOUNTER — Telehealth: Payer: Self-pay | Admitting: Internal Medicine

## 2014-06-11 DIAGNOSIS — M25522 Pain in left elbow: Principal | ICD-10-CM

## 2014-06-11 DIAGNOSIS — G8929 Other chronic pain: Secondary | ICD-10-CM

## 2014-06-11 NOTE — Telephone Encounter (Signed)
Patient wanting results . [facility] ray done at [facility] on her elbow.

## 2014-06-11 NOTE — Telephone Encounter (Signed)
Called and advised patient of results

## 2014-06-11 NOTE — Telephone Encounter (Signed)
Elbow x rays were negative for fracture, dislocation and joint effusion

## 2014-06-19 ENCOUNTER — Other Ambulatory Visit (INDEPENDENT_AMBULATORY_CARE_PROVIDER_SITE_OTHER): Payer: Medicare Other

## 2014-06-19 DIAGNOSIS — Z452 Encounter for adjustment and management of vascular access device: Secondary | ICD-10-CM

## 2014-06-19 DIAGNOSIS — Z7901 Long term (current) use of anticoagulants: Secondary | ICD-10-CM

## 2014-06-19 LAB — PROTIME-INR
INR: 2.2 ratio — ABNORMAL HIGH (ref 0.8–1.0)
Prothrombin Time: 23.8 s — ABNORMAL HIGH (ref 9.6–13.1)

## 2014-06-20 ENCOUNTER — Other Ambulatory Visit: Payer: 59

## 2014-06-28 ENCOUNTER — Ambulatory Visit (INDEPENDENT_AMBULATORY_CARE_PROVIDER_SITE_OTHER): Payer: 59 | Admitting: Cardiovascular Disease

## 2014-06-28 ENCOUNTER — Encounter: Payer: Self-pay | Admitting: Cardiovascular Disease

## 2014-06-28 VITALS — BP 148/80 | HR 82 | Ht 66.0 in | Wt 130.0 lb

## 2014-06-28 DIAGNOSIS — I4891 Unspecified atrial fibrillation: Secondary | ICD-10-CM

## 2014-06-28 DIAGNOSIS — I2510 Atherosclerotic heart disease of native coronary artery without angina pectoris: Secondary | ICD-10-CM

## 2014-06-28 DIAGNOSIS — I25119 Atherosclerotic heart disease of native coronary artery with unspecified angina pectoris: Secondary | ICD-10-CM

## 2014-06-28 DIAGNOSIS — I209 Angina pectoris, unspecified: Secondary | ICD-10-CM

## 2014-06-28 DIAGNOSIS — I10 Essential (primary) hypertension: Secondary | ICD-10-CM

## 2014-06-28 NOTE — Assessment & Plan Note (Signed)
She has chronic atrial fibrillation with reasonably controlled ventricular rate even after stopping metoprolol. I made no changes today. She is on long-term anticoagulation with warfarin without complications.

## 2014-06-28 NOTE — Assessment & Plan Note (Signed)
Blood pressure seems to be reasonably controlled after increasing lisinopril.

## 2014-06-28 NOTE — Progress Notes (Signed)
HPI  Ms. Ala DachKraemer is a very pleasant 80 year old woman with a history of permanent atrial fibrillation on warfarin, hyperlipidemia with notes indicating elevated liver function tests on statins, history of DVT, hypertension, bilateral mastectomy, presenting for routine followup.  She was hospitalized in November, 2013 for left-sided weakness with suspected stroke. She was off warfarin at that time due to significant left arm hematoma after a fall. She did have a non-ST elevation MI with peak troponin 2.5 felt secondary to demand ischemia. No cardiac workup was performed apart from echocardiogram. Echocardiogram showed normal LV systolic function. She denies any chest pain, shortness of breath or palpitations. In April the dose of lisinopril was increased to 40 mg once daily by Dr. Darrick Huntsmanullo due to elevated blood pressure. The patient reports worsening dizziness since then. She also complained of fatigue and was noted to have low heart rate in the 50s. Metoprolol was discontinued in June. In spite of these changes, the patient reports no improvement in symptoms.   Allergies  Allergen Reactions  . Amoxicillin     rash  . Amoxicillin-Pot Clavulanate     Swelling & rash  . Cephalexin     rash  . Cephalexin   . Clarithromycin     rash  . Clindamycin/Lincomycin     rash  . Lansoprazole   . Macrodantin   . Sulfa Antibiotics   . Tussionex Pennkinetic Er [Hydrocod Polst-Cpm Polst Er]     Rash across her chest and side     Current Outpatient Prescriptions on File Prior to Visit  Medication Sig Dispense Refill  . acetaminophen (TYLENOL) 500 MG tablet Take 500 mg by mouth every 6 (six) hours as needed for pain.      . calcium carbonate (OS-CAL) 600 MG TABS Take 600 mg by mouth daily.        . Calcium Carbonate-Simethicone (MAALOX ADVANCED MAX ST) 1000-60 MG CHEW Chew 1 tablet by mouth as needed.        . Carboxymethylcellul-Glycerin (REFRESH OPTIVE OP) Apply to eye.      .  carboxymethylcellulose (REFRESH PLUS) 0.5 % SOLN 1 drop 3 (three) times daily as needed.      . indapamide (LOZOL) 2.5 MG tablet TAKE 1 TABLET BY MOUTH EVERY MORNING  30 tablet  3  . ipratropium (ATROVENT) 0.06 % nasal spray Place 2 sprays into the nose daily.      . isosorbide mononitrate (IMDUR) 60 MG 24 hr tablet TAKE 1 TABLET BY MOUTH EVERY DAY  30 tablet  6  . lisinopril (PRINIVIL,ZESTRIL) 40 MG tablet Take 1 tablet (40 mg total) by mouth daily.  90 tablet  1  . nitroGLYCERIN (NITROSTAT) 0.4 MG SL tablet Place 1 tablet (0.4 mg total) under the tongue every 5 (five) minutes as needed.  90 tablet  2  . potassium chloride (K-DUR) 10 MEQ tablet TAKE 1 TABLET BY MOUTH TWICE DAILY  60 tablet  5  . Tdap (BOOSTRIX) 5-2.5-18.5 LF-MCG/0.5 injection Inject 0.5 mLs into the muscle once.  0.5 mL  0  . warfarin (COUMADIN) 3 MG tablet TAKE 1 TABLET BY MOUTH EVERY DAY  30 tablet  5  . zoster vaccine live, PF, (ZOSTAVAX) [id number] UNT/0.65ML injection Inject 19,400 Units into the skin once.  1 each  0   No current facility-administered medications on file prior to visit.     Past Medical History  Diagnosis Date  . Atrial fibrillation   . Polyp, stomach   . Colon polyp 2008  .  Sensorineural hearing loss 2006    sudden, left ear  . DVT (deep venous thrombosis) May 2007  . History of DVT of lower extremity May 2007  . Hyperlipidemia   . Hypertension   . Stroke   . Coronary artery disease      Past Surgical History  Procedure Laterality Date  . Laser surgery for varicose veins    . Nasal polyp surgery  2008    Dr. Jenne Campus, has been horase ever since  . Breast surgery  1972    mastectomies, presumed due to breast CA  . Abdominal hysterectomy  1965  . Appendectomy  1950  . Tonsillectomy  1943  . Laminectomy  1975  . Thyroid surgery  1977    nodule removed     Family History  Problem Relation Age of Onset  . Cancer Other   . Heart failure Mother   . Heart failure Brother      History     Social History  . Marital Status: Married    Spouse Name: N/A    Number of Children: N/A  . Years of Education: N/A   Occupational History  . Not on file.   Social History Main Topics  . Smoking status: Never Smoker   . Smokeless tobacco: Never Used  . Alcohol Use: No     Comment: glass a wine at bedtime  . Drug Use: No  . Sexual Activity: No   Other Topics Concern  . Not on file   Social History Narrative  . No narrative on file     PHYSICAL EXAM   BP 148/80  Pulse 82  Ht 5\' 6"  (1.676 m)  Wt 130 lb (58.968 kg)  BMI 20.99 kg/m2 Constitutional: She is oriented to person, place, and time. She appears well-developed and well-nourished. No distress.  HENT: No nasal discharge.  Head: Normocephalic and atraumatic.  Eyes: Pupils are equal and round. Right eye exhibits no discharge. Left eye exhibits no discharge.  Neck: Normal range of motion. Neck supple. No JVD present. No thyromegaly present.  Cardiovascular: Normal rate, irregular rhythm, normal heart sounds. Exam reveals no gallop and no friction rub. No murmur heard.  Pulmonary/Chest: Effort normal and breath sounds normal. No stridor. No respiratory distress. She has no wheezes. She has no rales. She exhibits no tenderness.  Abdominal: Soft. Bowel sounds are normal. She exhibits no distension. There is no tenderness. There is no rebound and no guarding.  Musculoskeletal: Normal range of motion. She exhibits no edema and no tenderness.  Neurological: She is alert and oriented to person, place, and time. Coordination normal.  Skin: Skin is warm and dry. No rash noted. She is not diaphoretic. No erythema. No pallor.  Psychiatric: She has a normal mood and affect. Her behavior is normal. Judgment and thought content normal.    EKG: Atrial fibrillation  - occasional ectopic ventricular beat    -Anteroseptal infarct -age undetermined.   -Nonspecific ST depression  -Nondiagnostic.   ABNORMAL      ASSESSMENT AND  PLAN

## 2014-06-28 NOTE — Patient Instructions (Signed)
Continue same medications.   Your physician wants you to follow-up in: 6 months.  You will receive a reminder letter in the mail two months in advance. If you don't receive a letter, please call our office to schedule the follow-up appointment.  

## 2014-06-28 NOTE — Assessment & Plan Note (Signed)
The patient likely has underlying coronary artery disease based on the results of a previous stress test as well as previous small non-ST elevation myocardial infarction. However, I have recommended medical therapy due to lack of anginal symptoms.

## 2014-07-05 ENCOUNTER — Telehealth: Payer: Self-pay | Admitting: Internal Medicine

## 2014-07-05 NOTE — Telephone Encounter (Signed)
The patient is wanting a medication change .She takes lisinopril (PRINIVIL,ZESTRIL) 40 MG tablet . She states that she is having bad dreams because of the Lisinopril .  The patient will be gone from her home for an hour . She is wanting a call before we close.

## 2014-07-06 ENCOUNTER — Telehealth: Payer: Self-pay | Admitting: Internal Medicine

## 2014-07-06 MED ORDER — LOSARTAN POTASSIUM 100 MG PO TABS: 100.0000 mg | ORAL_TABLET | Freq: Every day | ORAL | Status: DC

## 2014-07-06 NOTE — Telephone Encounter (Signed)
Patient stated she cannot take the losartan that the paper that came with this medication, and her pharmacist stated that she should not take with some of her medication patient wants to go back on metoprolol and her regular BP medication of

## 2014-07-06 NOTE — Telephone Encounter (Signed)
She can stop the medication and I will call a new rx to her pharmacy

## 2014-07-06 NOTE — Telephone Encounter (Signed)
Patient notified and voiced understanding.

## 2014-07-06 NOTE — Telephone Encounter (Signed)
Please tell her that I am aware of the interaction and it is the same potential interaction that lisinopril can have,  And it is is realted to her potassium levels.  Her potassium level is fine. We chck it every 3 months.  The metoprolol is not a good choice bc it has slowed her heart down too much in the past  Please tell her to stop listening to the pharmacist and listen to me,  Her doctor

## 2014-07-11 ENCOUNTER — Encounter: Payer: Self-pay | Admitting: Internal Medicine

## 2014-07-11 ENCOUNTER — Ambulatory Visit (INDEPENDENT_AMBULATORY_CARE_PROVIDER_SITE_OTHER): Payer: 59 | Admitting: Internal Medicine

## 2014-07-11 VITALS — BP 140/70 | HR 63 | Temp 97.9°F | Resp 14 | Ht 66.0 in | Wt 129.0 lb

## 2014-07-11 DIAGNOSIS — I10 Essential (primary) hypertension: Secondary | ICD-10-CM

## 2014-07-11 NOTE — Progress Notes (Signed)
Pre-visit discussion using our clinic review tool. No additional management support is needed unless otherwise documented below in the visit note.  

## 2014-07-11 NOTE — Patient Instructions (Addendum)
Your blood pressure is well controlled currently with the lisinopril , but I want you to take the losartan instead of the lisinopril  Take the losartan at bedtime , take  The  isosorbide and indapamide as you are currently doing   Check your bp 3 times a week   Call if all three readings are > 150/90 and we discuss making a change

## 2014-07-11 NOTE — Progress Notes (Signed)
Patient ID: Latoya Merritt, female   DOB: 09/03/1920, 80 y.o.   MRN: [id number]   Patient Active Problem List   Diagnosis Date Noted  . Elbow pain, chronic 06/05/2014  . History of non-ST elevation myocardial infarction (NSTEMI) 06/11/2013  . Bradycardia, sinus 12/22/2012  . Coronary artery disease   . Warfarin-induced coagulopathy 09/21/2012  . Cognitive deficits 12/31/2011  . History of CVA (cerebrovascular accident) 12/31/2011  . Neck pain 10/26/2011  . Hyperlipidemia   . Hypertension   . Colon polyp   . History of DVT of lower extremity   . HTN (hypertension) 04/13/2011  . Atrial fibrillation 08/27/2010  . CHEST PAIN 08/27/2010  . ABNORMAL CV (STRESS) TEST 08/27/2010  . DVT (deep venous thrombosis) 04/20/2006    Subjective:  CC:   Chief Complaint  Patient presents with  . Follow-up    blood pressure medication.    HPI:   Latoya Commanderellie G Grecco is a 80 y.o. female who presents for Medication issues regarding hypertension management.   Patient reported recurrent nightmares attributed to lisinopril  .  Whe she was switched to losartan she was warned by her pharmacist about a potential interaction and refused to take, it , asking for metoprolol instead.    She has a history of symptomatic bradycardia with prior trial of atrial fibrillation.    Past Medical History  Diagnosis Date  . Atrial fibrillation   . Polyp, stomach   . Colon polyp 2008  . Sensorineural hearing loss 2006    sudden, left ear  . DVT (deep venous thrombosis) May 2007  . History of DVT of lower extremity May 2007  . Hyperlipidemia   . Hypertension   . Stroke   . Coronary artery disease     Past Surgical History  Procedure Laterality Date  . Laser surgery for varicose veins    . Nasal polyp surgery  2008    Dr. Jenne CampusMcQueen, has been horase ever since  . Breast surgery  1972    mastectomies, presumed due to breast CA  . Abdominal hysterectomy  1965  . Appendectomy  1950  . Tonsillectomy  1943  .  Laminectomy  1975  . Thyroid surgery  1977    nodule removed       The following portions of the patient's history were reviewed and updated as appropriate: Allergies, current medications, and problem list.    Review of Systems:   Patient denies headache, fevers, malaise, unintentional weight loss, skin rash, eye pain, sinus congestion and sinus pain, sore throat, dysphagia,  hemoptysis , cough, dyspnea, wheezing, chest pain, palpitations, orthopnea, edema, abdominal pain, nausea, melena, diarrhea, constipation, flank pain, dysuria, hematuria, urinary  Frequency, nocturia, numbness, tingling, seizures,  Focal weakness, Loss of consciousness,  Tremor, insomnia, depression, anxiety, and suicidal ideation.     History   Social History  . Marital Status: Married    Spouse Name: N/A    Number of Children: N/A  . Years of Education: N/A   Occupational History  . Not on file.   Social History Main Topics  . Smoking status: Never Smoker   . Smokeless tobacco: Never Used  . Alcohol Use: No     Comment: glass a wine at bedtime  . Drug Use: No  . Sexual Activity: No   Other Topics Concern  . Not on file   Social History Narrative  . No narrative on file    Objective:  Filed Vitals:   07/11/14 1612  BP: 140/70  Pulse: 63  Temp: 97.9 F (36.6 C)  Resp: 14     General appearance: alert, cooperative and appears stated age Ears: normal TM's and external ear canals both ears Throat: lips, mucosa, and tongue normal; teeth and gums normal Neck: no adenopathy, no carotid bruit, supple, symmetrical, trachea midline and thyroid not enlarged, symmetric, no tenderness/mass/nodules Back: symmetric, no curvature. ROM normal. No CVA tenderness. Lungs: clear to auscultation bilaterally Heart: regular rate and rhythm, S1, S2 normal, no murmur, click, rub or gallop Abdomen: soft, non-tender; bowel sounds normal; no masses,  no organomegaly Pulses: 2+ and symmetric Skin: Skin color,  texture, turgor normal. No rashes or lesions Lymph nodes: Cervical, supraclavicular, and axillary nodes normal.  Assessment and Plan:  HTN (hypertension) Advised to take losartan  Reassurance provided that her electrolytes were dine.   Lab Results  Component Value Date   CREATININE 1.2 05/29/2014    Lab Results  Component Value Date   NA 136 05/29/2014   K 3.8 05/29/2014   CL 99 05/29/2014   CO2 28 05/29/2014      Updated Medication List Outpatient Encounter Prescriptions as of 07/11/2014  Medication Sig  . acetaminophen (TYLENOL) 500 MG tablet Take 500 mg by mouth every 6 (six) hours as needed for pain.  . calcium carbonate (OS-CAL) 600 MG TABS Take 600 mg by mouth daily.    . Calcium Carbonate-Simethicone (MAALOX ADVANCED MAX ST) 1000-60 MG CHEW Chew 1 tablet by mouth as needed.    . Carboxymethylcellul-Glycerin (REFRESH OPTIVE OP) Apply to eye.  . carboxymethylcellulose (REFRESH PLUS) 0.5 % SOLN 1 drop 3 (three) times daily as needed.  . indapamide (LOZOL) 2.5 MG tablet TAKE 1 TABLET BY MOUTH EVERY MORNING  . ipratropium (ATROVENT) 0.06 % nasal spray Place 2 sprays into the nose daily.  . isosorbide mononitrate (IMDUR) 60 MG 24 hr tablet TAKE 1 TABLET BY MOUTH EVERY DAY  . nitroGLYCERIN (NITROSTAT) 0.4 MG SL tablet Place 1 tablet (0.4 mg total) under the tongue every 5 (five) minutes as needed.  . potassium chloride (K-DUR) 10 MEQ tablet TAKE 1 TABLET BY MOUTH TWICE DAILY  . Tdap (BOOSTRIX) 5-2.5-18.5 LF-MCG/0.5 injection Inject 0.5 mLs into the muscle once.  . warfarin (COUMADIN) 3 MG tablet TAKE 1 TABLET BY MOUTH EVERY DAY  . zoster vaccine live, PF, (ZOSTAVAX) 16109 UNT/0.65ML injection Inject 19,400 Units into the skin once.  . [DISCONTINUED] losartan (COZAAR) 100 MG tablet Take 1 tablet (100 mg total) by mouth daily.     No orders of the defined types were placed in this encounter.    No Follow-up on file.

## 2014-07-13 ENCOUNTER — Encounter: Payer: Self-pay | Admitting: Internal Medicine

## 2014-07-13 NOTE — Assessment & Plan Note (Signed)
Advised to take losartan  Reassurance provided that her electrolytes were dine.   Lab Results  Component Value Date   CREATININE 1.2 05/29/2014    Lab Results  Component Value Date   NA 136 05/29/2014   K 3.8 05/29/2014   CL 99 05/29/2014   CO2 28 05/29/2014

## 2014-07-17 ENCOUNTER — Other Ambulatory Visit (INDEPENDENT_AMBULATORY_CARE_PROVIDER_SITE_OTHER): Payer: 59

## 2014-07-17 DIAGNOSIS — Z7901 Long term (current) use of anticoagulants: Secondary | ICD-10-CM

## 2014-07-17 LAB — PROTIME-INR
INR: 2.2 ratio — ABNORMAL HIGH (ref 0.8–1.0)
Prothrombin Time: 23.9 s — ABNORMAL HIGH (ref 9.6–13.1)

## 2014-08-07 ENCOUNTER — Ambulatory Visit (INDEPENDENT_AMBULATORY_CARE_PROVIDER_SITE_OTHER): Payer: Medicare Other | Admitting: Internal Medicine

## 2014-08-07 ENCOUNTER — Encounter: Payer: Self-pay | Admitting: Internal Medicine

## 2014-08-07 VITALS — BP 152/73 | HR 75 | Temp 97.7°F | Resp 16 | Ht 66.0 in | Wt 127.8 lb

## 2014-08-07 DIAGNOSIS — R350 Frequency of micturition: Secondary | ICD-10-CM

## 2014-08-07 DIAGNOSIS — I10 Essential (primary) hypertension: Secondary | ICD-10-CM

## 2014-08-07 MED ORDER — LISINOPRIL 20 MG PO TABS: 20.0000 mg | ORAL_TABLET | Freq: Every day | ORAL | Status: DC

## 2014-08-07 NOTE — Progress Notes (Signed)
Patient ID: Latoya Merritt, female   DOB: 09-03-1920, 80 y.o.   MRN: [id number]   Patient Active Problem List   Diagnosis Date Noted  . Urinary frequency 08/09/2014  . Elbow pain, chronic 06/05/2014  . History of non-ST elevation myocardial infarction (NSTEMI) 06/11/2013  . Bradycardia, sinus 12/22/2012  . Coronary artery disease   . Warfarin-induced coagulopathy 09/21/2012  . Cognitive deficits 12/31/2011  . History of CVA (cerebrovascular accident) 12/31/2011  . Neck pain 10/26/2011  . Hyperlipidemia   . Hypertension   . Colon polyp   . History of DVT of lower extremity   . HTN (hypertension) 04/13/2011  . Atrial fibrillation 08/27/2010  . CHEST PAIN 08/27/2010  . ABNORMAL CV (STRESS) TEST 08/27/2010  . DVT (deep venous thrombosis) 04/20/2006    Subjective:  CC:   Chief Complaint  Patient presents with  . Follow-up    on medications    HPI:   Latoya Merritt is a 80 y.o. female who presents for Follow up on hypertension.  She was last seen a month ago .  Medications were changed because she reported having recurrent nightmares she attributed to lisinopril.  losartan was added for labile readings approaching 200/100.  She initially refused to take it because of something her pharmacist said about a potential interaction.  She requested change in medication to metoprolol.   She was brought in and her concerns were discussed at length and reassurance provided. She was reminded that she has a history of symptomatic bradycardia with prior trials of metoprolol .   She returns today and states that she simply cannot take her losartan because it is making her urinate all the time.  She insists it is the medication causing it and that it has not improved her blood pressure. She refuses to have her urine checked to rule out infection.  She states that she has a history of bladder cysts,  And has seen urology, Dr.  Sheppard Penton  In the past .    Past Medical History  Diagnosis Date  .  Atrial fibrillation   . Polyp, stomach   . Colon polyp 2008  . Sensorineural hearing loss 2006    sudden, left ear  . DVT (deep venous thrombosis) May 2007  . History of DVT of lower extremity May 2007  . Hyperlipidemia   . Hypertension   . Stroke   . Coronary artery disease     Past Surgical History  Procedure Laterality Date  . Laser surgery for varicose veins    . Nasal polyp surgery  2008    Dr. Jenne Campus, has been horase ever since  . Breast surgery  1972    mastectomies, presumed due to breast CA  . Abdominal hysterectomy  1965  . Appendectomy  1950  . Tonsillectomy  1943  . Laminectomy  1975  . Thyroid surgery  1977    nodule removed       The following portions of the patient's history were reviewed and updated as appropriate: Allergies, current medications, and problem list.    Review of Systems:   Patient denies headache, fevers, malaise, unintentional weight loss, skin rash, eye pain, sinus congestion and sinus pain, sore throat, dysphagia,  hemoptysis , cough, dyspnea, wheezing, chest pain, palpitations, orthopnea, edema, abdominal pain, nausea, melena, diarrhea, constipation, flank pain, dysuria, hematuria, urinary  Frequency, nocturia, numbness, tingling, seizures,  Focal weakness, Loss of consciousness,  Tremor, insomnia, depression, anxiety, and suicidal ideation.     History  Social History  . Marital Status: Married    Spouse Name: N/A    Number of Children: N/A  . Years of Education: N/A   Occupational History  . Not on file.   Social History Main Topics  . Smoking status: Never Smoker   . Smokeless tobacco: Never Used  . Alcohol Use: No     Comment: glass a wine at bedtime  . Drug Use: No  . Sexual Activity: No   Other Topics Concern  . Not on file   Social History Narrative  . No narrative on file    Objective:  Filed Vitals:   08/07/14 1653  BP: 152/73  Pulse: 75  Temp: 97.7 F (36.5 C)  Resp: 16     General  appearance: alert, cooperative and appears stated age Ears: normal TM's and external ear canals both ears Throat: lips, mucosa, and tongue normal; teeth and gums normal Neck: no adenopathy, no carotid bruit, supple, symmetrical, trachea midline and thyroid not enlarged, symmetric, no tenderness/mass/nodules Back: symmetric, no curvature. ROM normal. No CVA tenderness. Lungs: clear to auscultation bilaterally Heart: regular rate and rhythm, S1, S2 normal, no murmur, click, rub or gallop Abdomen: soft, non-tender; bowel sounds normal; no masses,  no organomegaly Pulses: 2+ and symmetric Skin: Skin color, texture, turgor normal. No rashes or lesions Lymph nodes: Cervical, supraclavicular, and axillary nodes normal.  Assessment and Plan:  Hypertension I have stopped her losartan today. She has agreed to a retrial of lisinopril at 20 mg daily We will reassess her BP in a month. She blames her elevations on anxiety about her niece going on a trip to the [place].   Urinary frequency UA advised to rule out infection but patient has declined.   A total of 25 minutes of face to face time was spent with patient more than half of which was spent in counselling and coordination of care   Updated Medication List Outpatient Encounter Prescriptions as of 08/07/2014  Medication Sig  . acetaminophen (TYLENOL) 500 MG tablet Take 500 mg by mouth every 6 (six) hours as needed for pain.  . calcium carbonate (OS-CAL) 600 MG TABS Take 600 mg by mouth daily.    . Carboxymethylcellul-Glycerin (REFRESH OPTIVE OP) Apply to eye.  . carboxymethylcellulose (REFRESH PLUS) 0.5 % SOLN 1 drop 3 (three) times daily as needed.  . indapamide (LOZOL) 2.5 MG tablet TAKE 1 TABLET BY MOUTH EVERY MORNING  . ipratropium (ATROVENT) 0.06 % nasal spray Place 2 sprays into the nose daily.  . isosorbide mononitrate (IMDUR) 60 MG 24 hr tablet TAKE 1 TABLET BY MOUTH EVERY DAY  . nitroGLYCERIN (NITROSTAT) 0.4 MG SL tablet Place 1  tablet (0.4 mg total) under the tongue every 5 (five) minutes as needed.  . [DISCONTINUED] potassium chloride (K-DUR) 10 MEQ tablet TAKE 1 TABLET BY MOUTH TWICE DAILY  . [DISCONTINUED] warfarin (COUMADIN) 3 MG tablet TAKE 1 TABLET BY MOUTH EVERY DAY  . Calcium Carbonate-Simethicone (MAALOX ADVANCED MAX ST) 1000-60 MG CHEW Chew 1 tablet by mouth as needed.    Marland Kitchen lisinopril (PRINIVIL,ZESTRIL) 20 MG tablet Take 1 tablet (20 mg total) by mouth daily.  . Tdap (BOOSTRIX) 5-2.5-18.5 LF-MCG/0.5 injection Inject 0.5 mLs into the muscle once.  . zoster vaccine live, PF, (ZOSTAVAX) 16109 UNT/0.65ML injection Inject 19,400 Units into the skin once.  . [DISCONTINUED] lisinopril (PRINIVIL,ZESTRIL) 20 MG tablet Take 1 tablet (20 mg total) by mouth daily.     No orders of the defined types were placed  in this encounter.    Return in about 3 months (around 11/07/2014).

## 2014-08-07 NOTE — Patient Instructions (Addendum)
Stop the losartan.  You can resume the lisinopril at 20 mg daily.  I sent it to [facility]   We should check your urine for signs of infection   If your symptoms continue , come back for a urinalysis

## 2014-08-07 NOTE — Progress Notes (Signed)
Pre-visit discussion using our clinic review tool. No additional management support is needed unless otherwise documented below in the visit note.  

## 2014-08-08 ENCOUNTER — Other Ambulatory Visit: Payer: Self-pay | Admitting: Internal Medicine

## 2014-08-08 MED ORDER — LISINOPRIL 20 MG PO TABS: 20.0000 mg | ORAL_TABLET | Freq: Every day | ORAL | Status: DC

## 2014-08-09 DIAGNOSIS — R350 Frequency of micturition: Secondary | ICD-10-CM | POA: Insufficient documentation

## 2014-08-09 NOTE — Assessment & Plan Note (Addendum)
I have stopped her losartan today. She has agreed to a retrial of lisinopril at 20 mg daily We will reassess her BP in a month. She blames her elevations on anxiety about her niece going on a trip to the [place].

## 2014-08-09 NOTE — Assessment & Plan Note (Signed)
UA advised to rule out infection but patient has declined.

## 2014-08-21 ENCOUNTER — Other Ambulatory Visit (INDEPENDENT_AMBULATORY_CARE_PROVIDER_SITE_OTHER): Payer: Medicare Other

## 2014-08-21 DIAGNOSIS — Z7901 Long term (current) use of anticoagulants: Secondary | ICD-10-CM

## 2014-08-21 LAB — PROTIME-INR
INR: 1.5 ratio — ABNORMAL HIGH (ref 0.8–1.0)
PROTHROMBIN TIME: 16.3 s — AB (ref 9.6–13.1)

## 2014-08-22 ENCOUNTER — Other Ambulatory Visit: Payer: Self-pay | Admitting: Internal Medicine

## 2014-08-28 ENCOUNTER — Other Ambulatory Visit: Payer: Self-pay | Admitting: *Deleted

## 2014-08-28 ENCOUNTER — Telehealth: Payer: Self-pay | Admitting: Internal Medicine

## 2014-08-28 MED ORDER — WARFARIN SODIUM 1 MG PO TABS: 1.0000 mg | ORAL_TABLET | Freq: Every day | ORAL | Status: DC

## 2014-08-28 NOTE — Telephone Encounter (Signed)
i will increase her daily dose to 4 mg daily.  She will need a repeat INR in one week ,, she has 3 mg and 1 mg tablets on file,  Can use them and refill if needed

## 2014-08-28 NOTE — Telephone Encounter (Signed)
Message copied by Sherlene Shams on Tue Aug 28, 2014 12:25 PM ------      Message from: Chandra Batch E      Created: Tue Aug 28, 2014  8:12 AM       Pt notified and verbalized understanding. Rx sent to pharmacy by escript. When did you want it rechecked? ------

## 2014-08-28 NOTE — Telephone Encounter (Signed)
Pt notified, lab appt scheduled 09/04/14. Rxs already sent to pharmacy

## 2014-09-04 ENCOUNTER — Other Ambulatory Visit (INDEPENDENT_AMBULATORY_CARE_PROVIDER_SITE_OTHER): Payer: Medicare Other

## 2014-09-04 DIAGNOSIS — Z7901 Long term (current) use of anticoagulants: Secondary | ICD-10-CM

## 2014-09-04 LAB — PROTIME-INR
INR: 2.6 ratio — AB (ref 0.8–1.0)
Prothrombin Time: 27.8 s — ABNORMAL HIGH (ref 9.6–13.1)

## 2014-09-18 IMAGING — US US EXTREM UP VENOUS*L*
1 series · 14 of 24 positions shown · non-contrast
Comparison: none

REASON FOR EXAM: edema  pain  eval for hematoma
COMMENTS:

PROCEDURE:     US  - US DOPPLER UP EXTR LEFT  - September 23, 2012  [DATE]
RESULT:     Left upper extremity color flow duplex Doppler reveals a 8.8 cm
in maximum diameter mass in the left upper medial arm consistent with
hematoma. No evidence of deep venous thrombosis.

[Series 1: us extrem up venous*left* · 0.07mm/px · 14 of 51 slices shown]
[im 1/51]
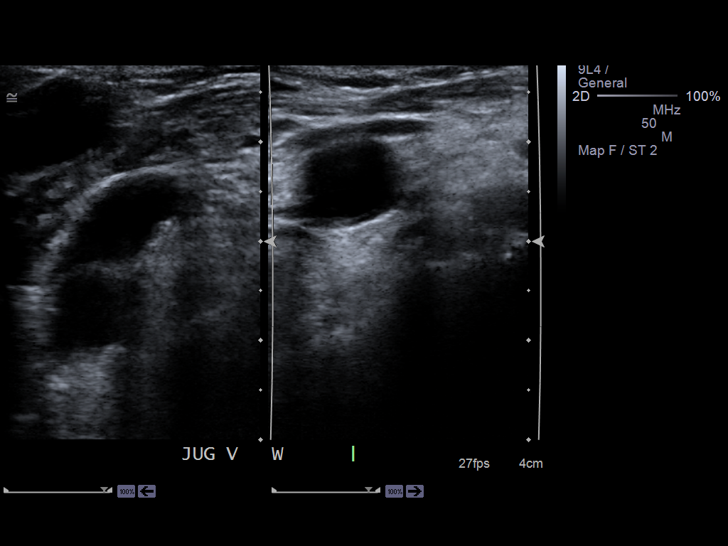
[im 5/51]
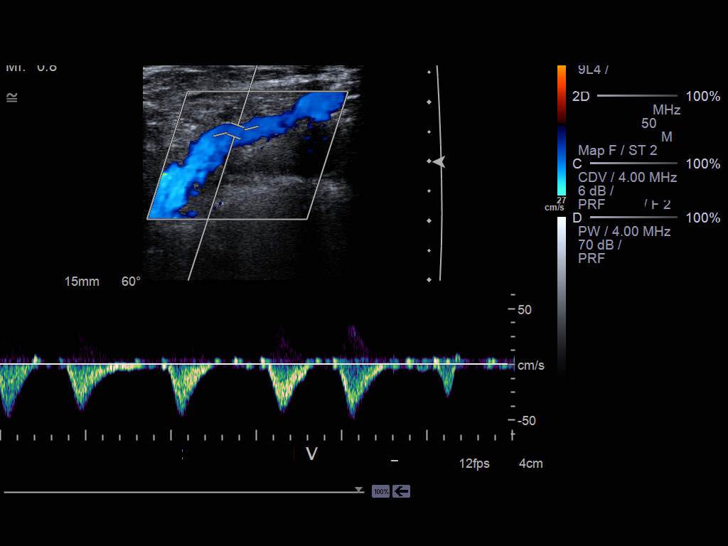
[im 9/51]
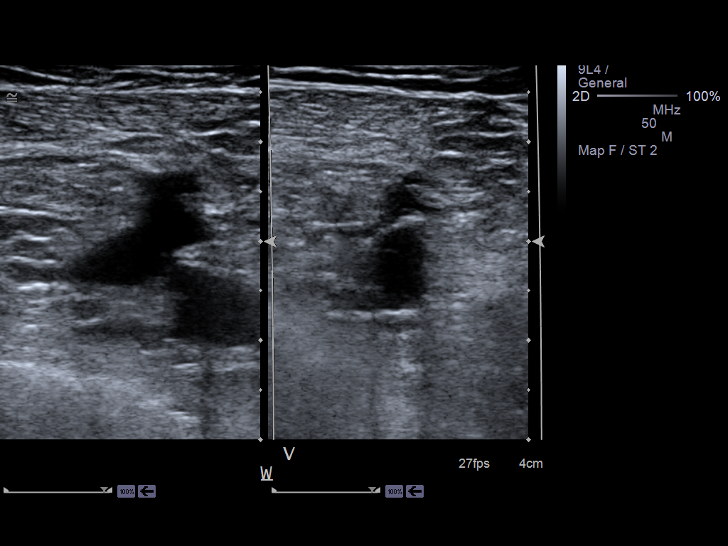
[im 14/51]
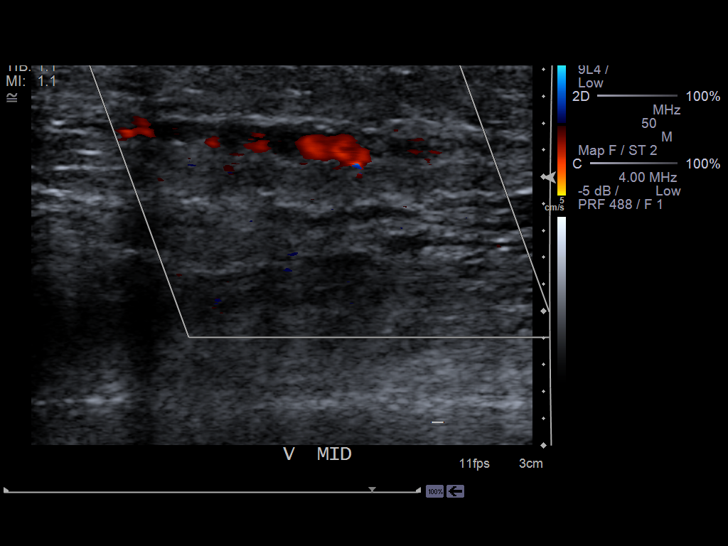
[im 16/51]
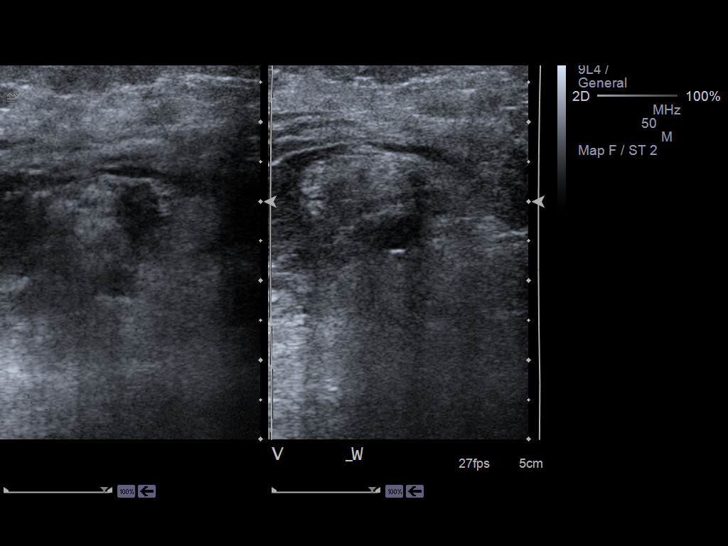
[im 20/51]
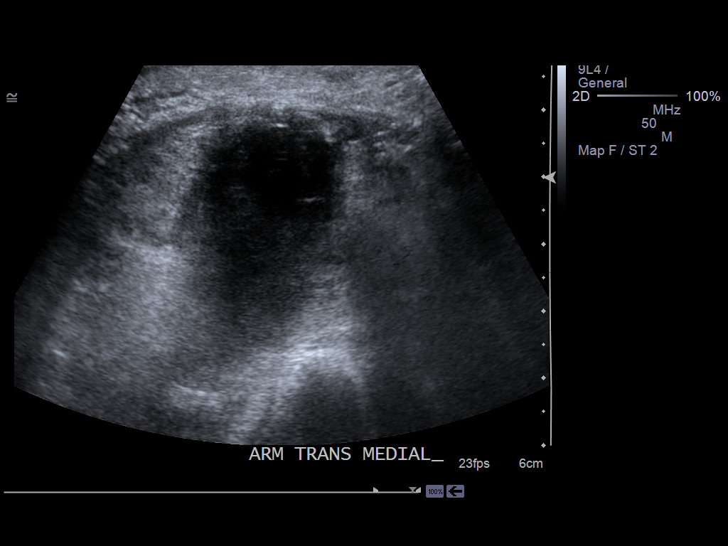
[im 24/51]
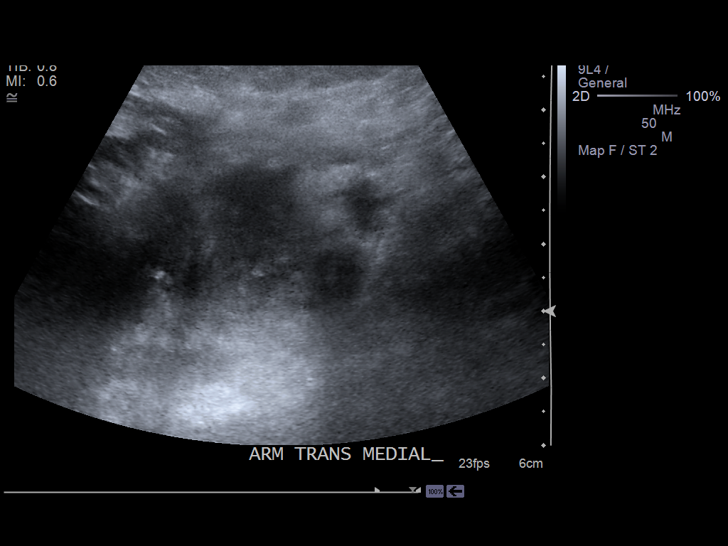
[im 27/51]
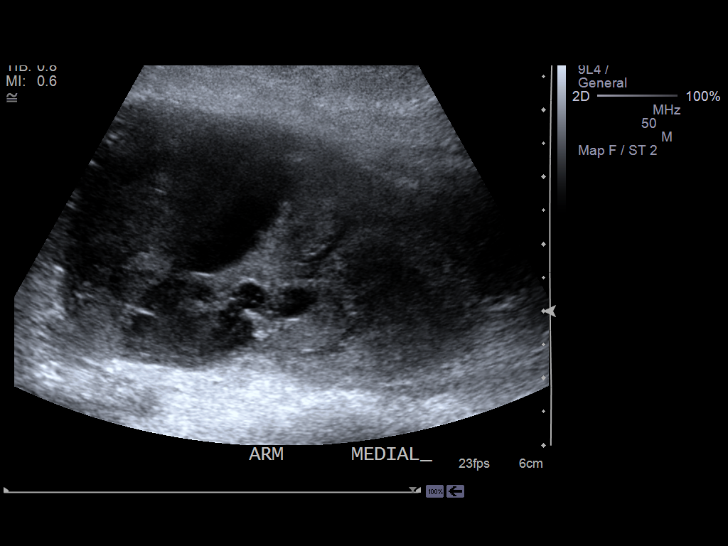
[im 31/51]
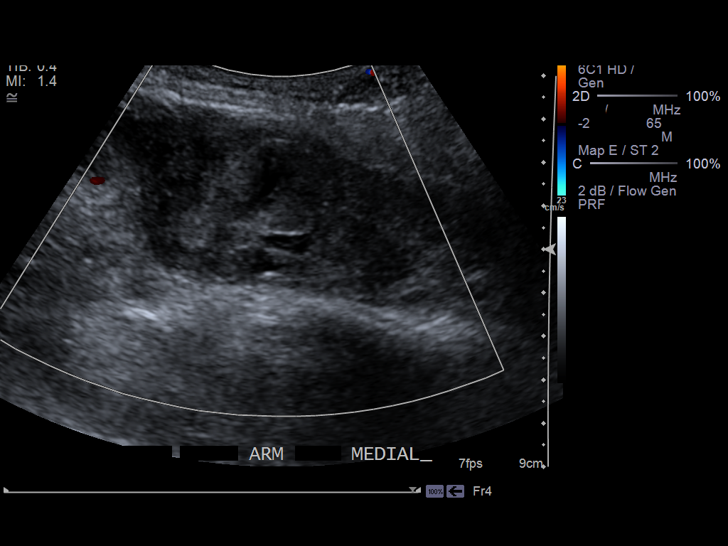
[im 35/51]
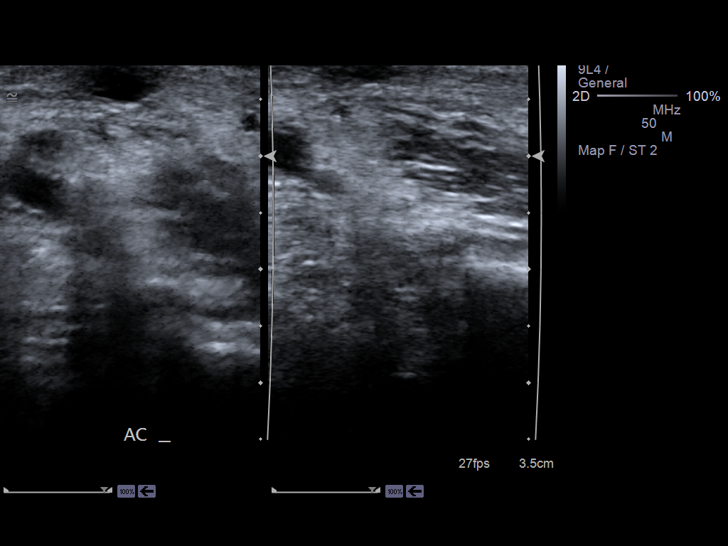
[im 40/51]
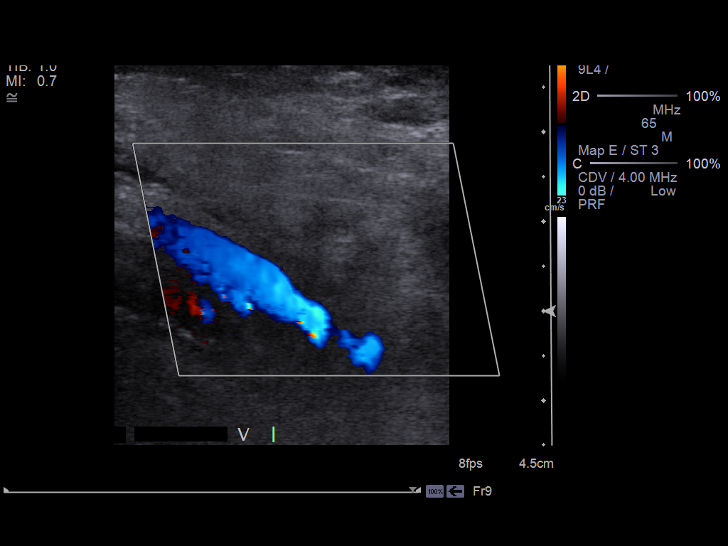
[im 42/51]
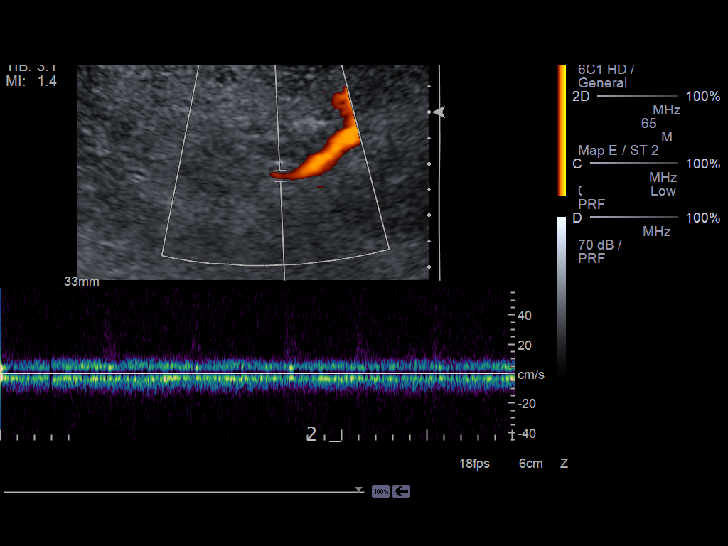
[im 46/51]
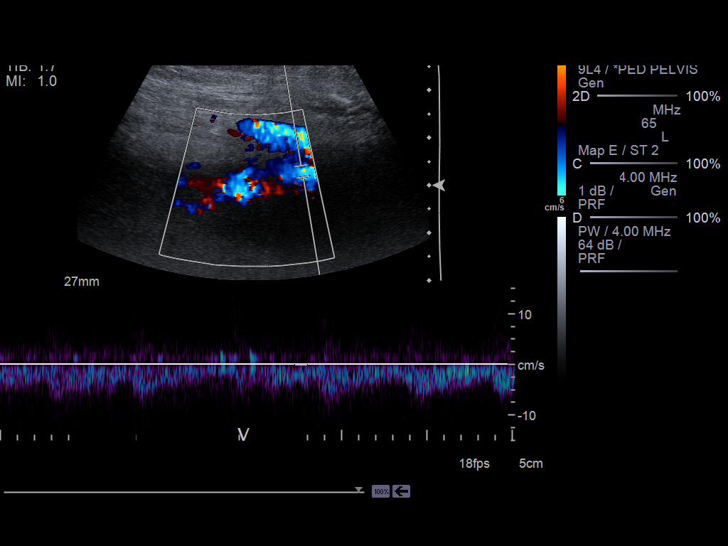
[im 51/51]
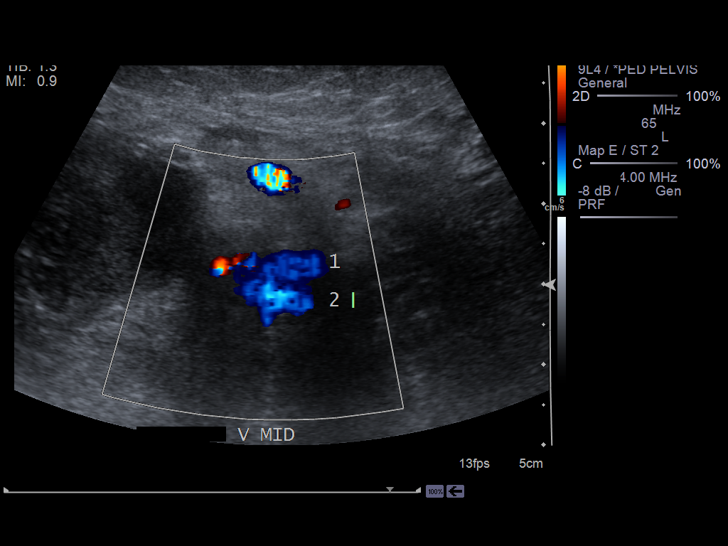

[14 of 24 positions shown; findings below may reference images not displayed]

IMPRESSION: Soft tissue mass in left upper medial  arm, most likely
hematoma given the patient's history of trauma. No evidence of deep venous
thrombosis.

## 2014-09-19 ENCOUNTER — Other Ambulatory Visit: Payer: Self-pay | Admitting: Internal Medicine

## 2014-09-19 IMAGING — CR DG FOREARM 2V*L*
1 series · 2 of 2 positions shown · non-contrast
Comparison: none

REASON FOR EXAM: bruising following trauma
COMMENTS:

PROCEDURE:     DXR - DXR FOREARM LEFT  - September 24, 2012  [DATE]
RESULT:     No evidence acute fracture. No evidence of dislocation.

[Series 1: x forearm ap left · 0.14mm/px · 2 of 2 slices shown]
[im 1/2]
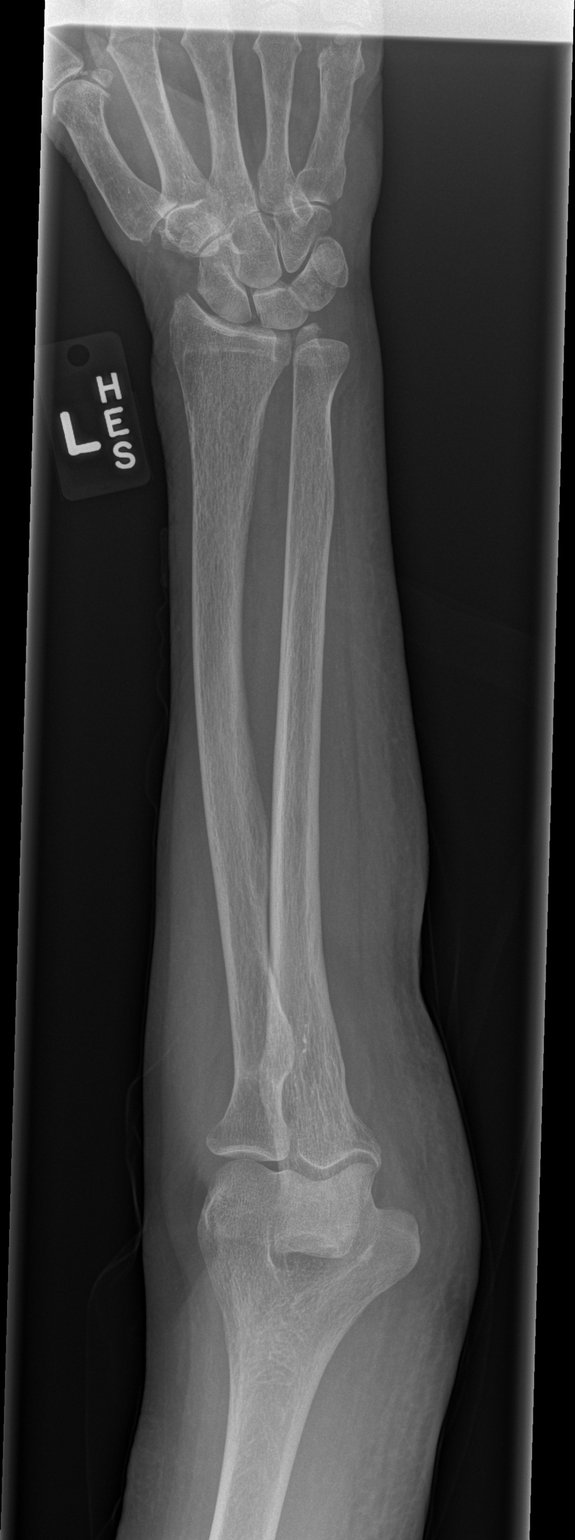
[im 2/2]
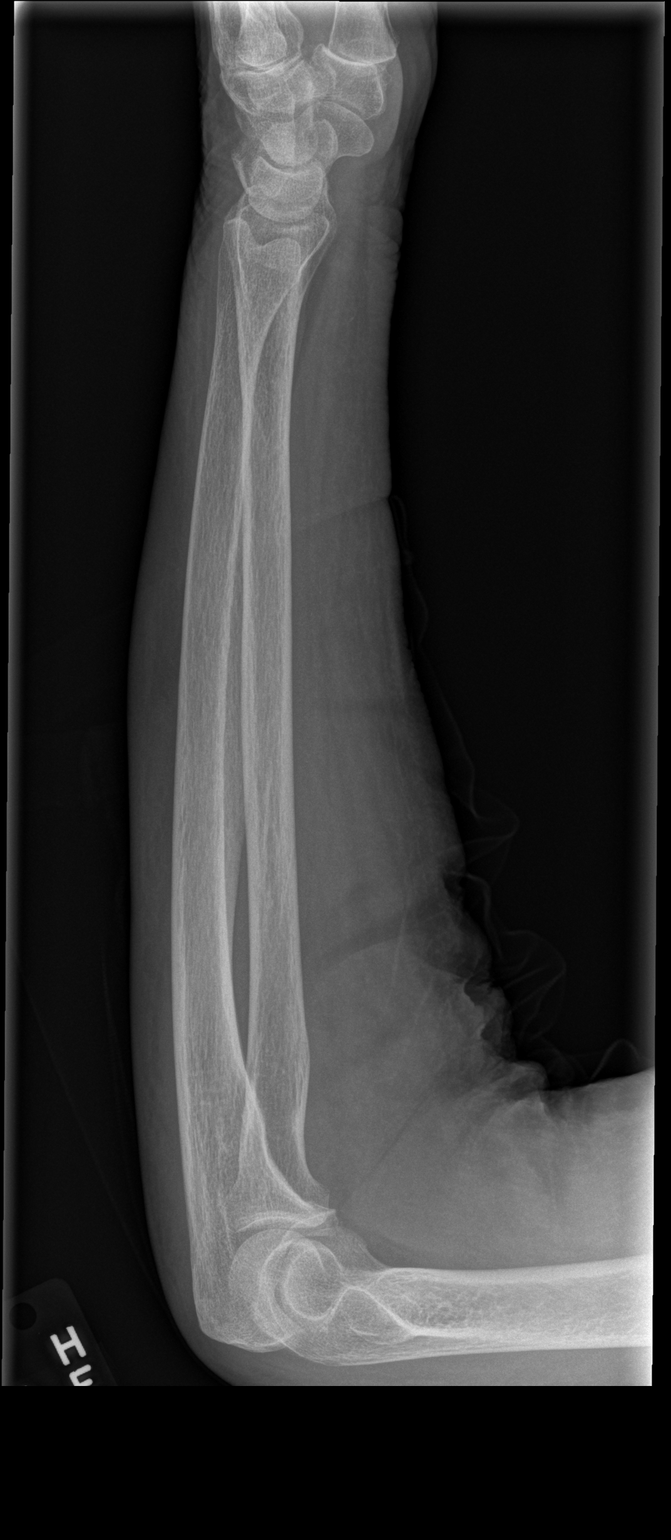

[2 of 2 positions shown; findings below may reference images not displayed]

IMPRESSION: No acute abnormality.

## 2014-09-25 ENCOUNTER — Telehealth: Payer: Self-pay | Admitting: Internal Medicine

## 2014-09-25 NOTE — Telephone Encounter (Signed)
Pt dropped off handicap form to be filled out. Form in Dr. Melina Schoolsullo's box.msn

## 2014-09-25 NOTE — Telephone Encounter (Signed)
Placed in quick sign ready for sig.

## 2014-09-26 NOTE — Telephone Encounter (Signed)
Spoke with pt, advised form ready for pick up 

## 2014-09-26 NOTE — Telephone Encounter (Signed)
Form completed and in red folder.  I do not charge $$$ for [employer] handicapped permits

## 2014-10-09 ENCOUNTER — Other Ambulatory Visit (INDEPENDENT_AMBULATORY_CARE_PROVIDER_SITE_OTHER): Payer: Medicare Other

## 2014-10-09 ENCOUNTER — Telehealth: Payer: Self-pay | Admitting: *Deleted

## 2014-10-09 DIAGNOSIS — Z7901 Long term (current) use of anticoagulants: Secondary | ICD-10-CM

## 2014-10-09 LAB — PROTIME-INR
INR: 4 ratio — ABNORMAL HIGH (ref 0.8–1.0)
PROTHROMBIN TIME: 42.4 s — AB (ref 9.6–13.1)

## 2014-10-09 NOTE — Telephone Encounter (Signed)
Patient notified

## 2014-10-09 NOTE — Telephone Encounter (Signed)
Pt wanted to let you know that she is bleeding very easily, she would like for someone to call her to be seen and wants to know if she needs to be seen, Latoya MessierKathy spoke with pt

## 2014-10-09 NOTE — Telephone Encounter (Signed)
Advised patient we would check the PT-INR and if to elevated we will call and patient has some bruising that looks like from support stockings.  Patient also c/o if she bumps her leg it will bleed, explained to patient that being on blood thinner was causing this and that as we age our skin becomes thinner and easier to crack open or tear. Please advise.

## 2014-10-09 NOTE — Telephone Encounter (Signed)
Have her stop wearing the stockings for now

## 2014-10-11 DIAGNOSIS — Z7901 Long term (current) use of anticoagulants: Secondary | ICD-10-CM | POA: Insufficient documentation

## 2014-10-11 NOTE — Addendum Note (Signed)
Addended by: Sherlene ShamsULLO, Cherron Blitzer L on: 10/11/2014 12:09 AM   Modules accepted: Orders, Medications

## 2014-10-11 NOTE — Assessment & Plan Note (Signed)
Lab Results  Component Value Date   INR 4.0* 10/09/2014   INR 2.6* 09/04/2014   INR 1.5* 08/21/2014   PROTIME 54.5* 05/27/2011    Dose reduced to 3 mg daily on oct 21

## 2014-10-13 ENCOUNTER — Other Ambulatory Visit: Payer: Self-pay | Admitting: Cardiovascular Disease

## 2014-10-16 IMAGING — CT CT HEAD WITHOUT CONTRAST
2 series · 16 of 30 positions shown, 20 images · non-contrast
Comparison: none

REASON FOR EXAM: left ue weakness, right gaze preference, left facial
palsy
COMMENTS:

PROCEDURE:     CT  - CT HEAD WITHOUT CONTRAST  - October 21, 2012  [DATE]
RESULT:
TECHNIQUE: Helical noncontrast 5 mm sections were obtained from the skull
base through the vertex.

[Series 2: without · axial · non-contrast · 0.42mm/px · z∈[-82,+42]mm · 13 of 31 slices shown, 17 images]
[im 3/31  brain]
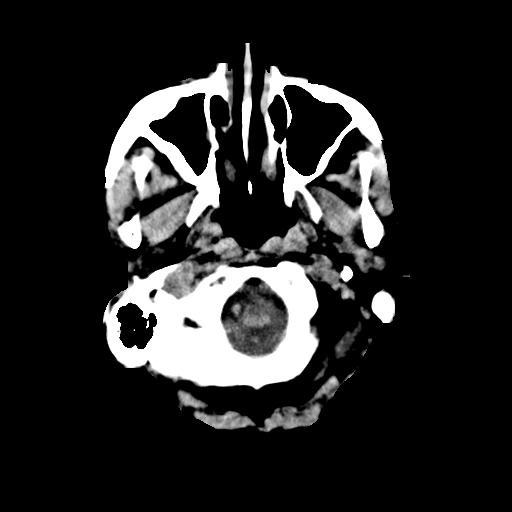
[im 3/31  bone]
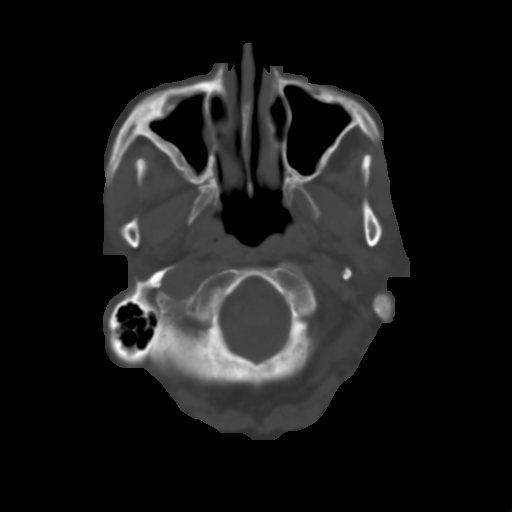
[im 5/31  brain]
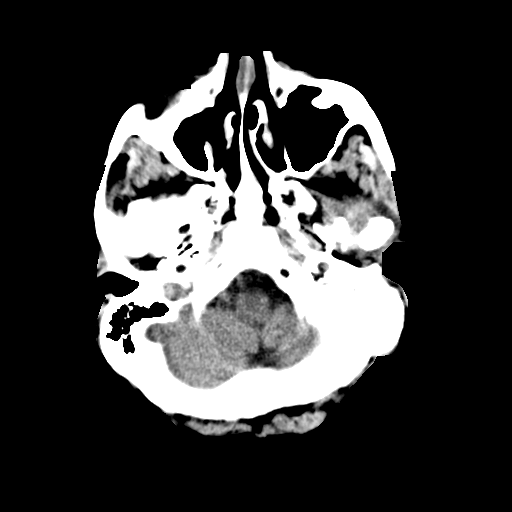
[im 7/31  brain]
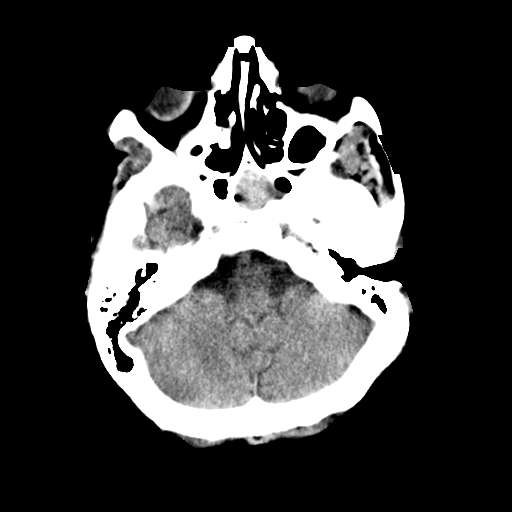
[im 9/31  brain]
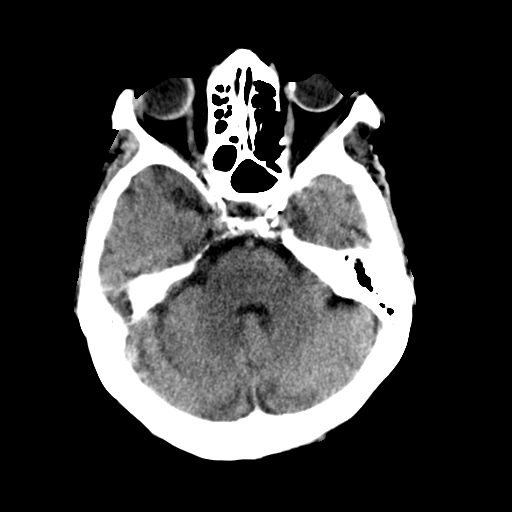
[im 11/31  brain]
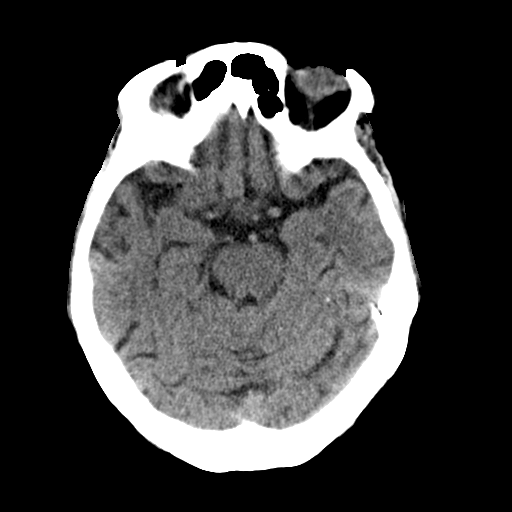
[im 11/31  bone]
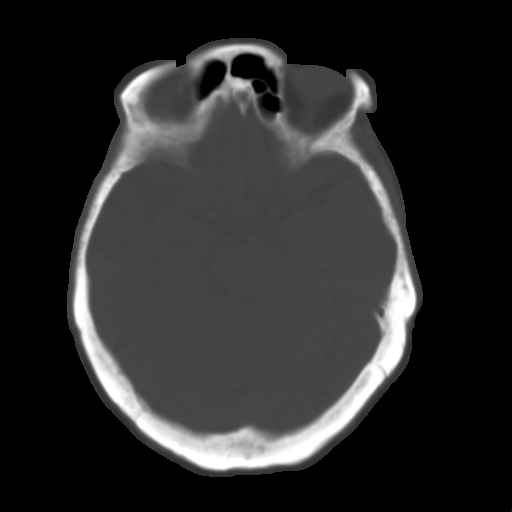
[im 13/31  brain]
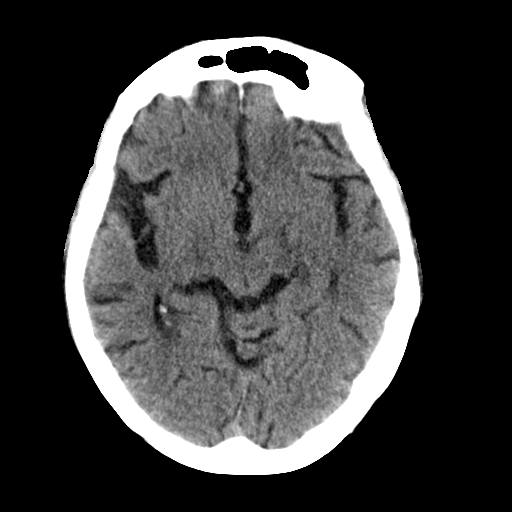
[im 16/31  brain]
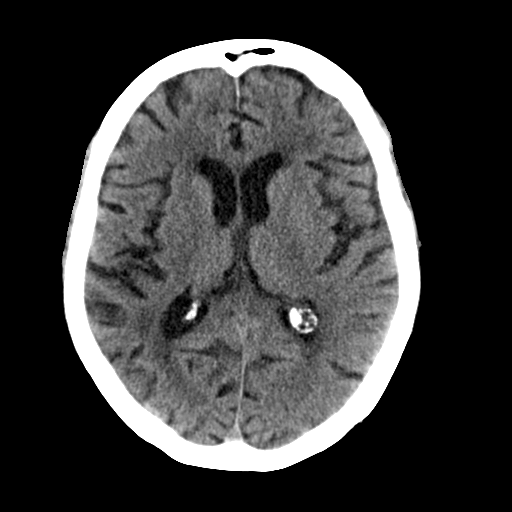
[im 18/31  brain]
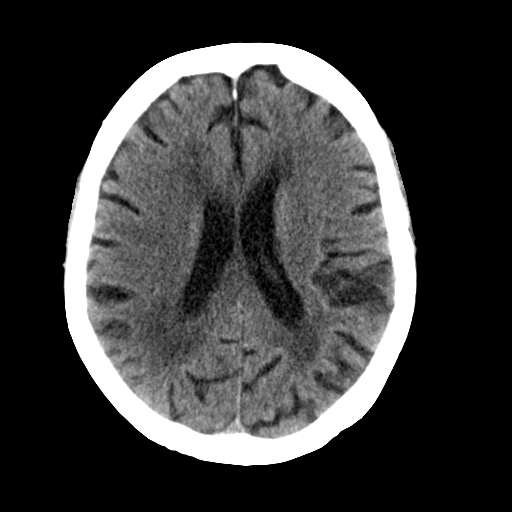
[im 20/31  brain]
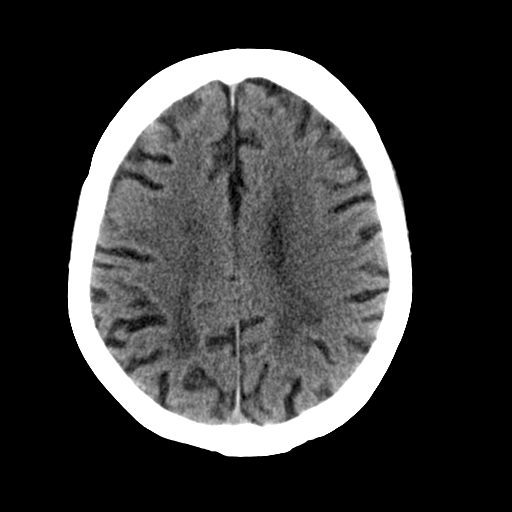
[im 20/31  bone]
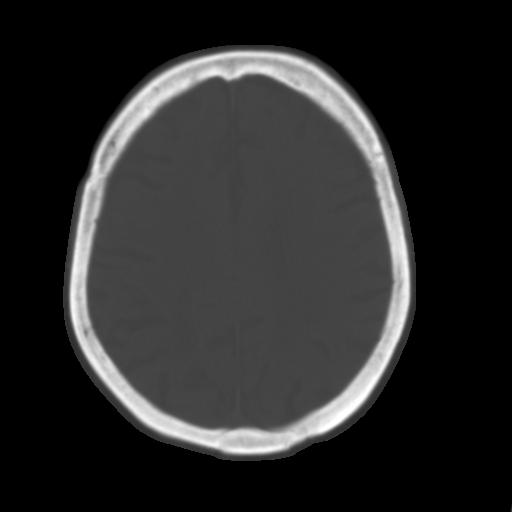
[im 22/31  brain]
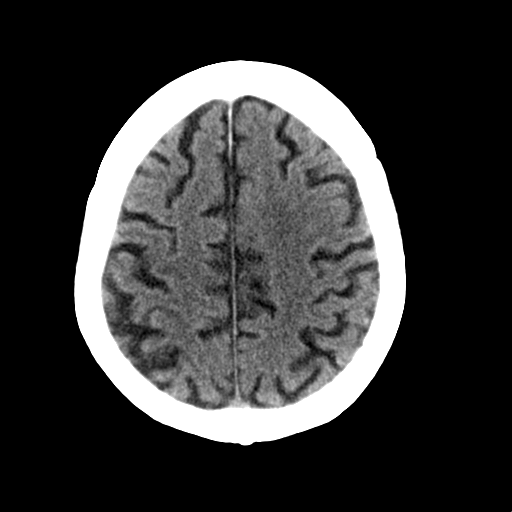
[im 24/31  brain]
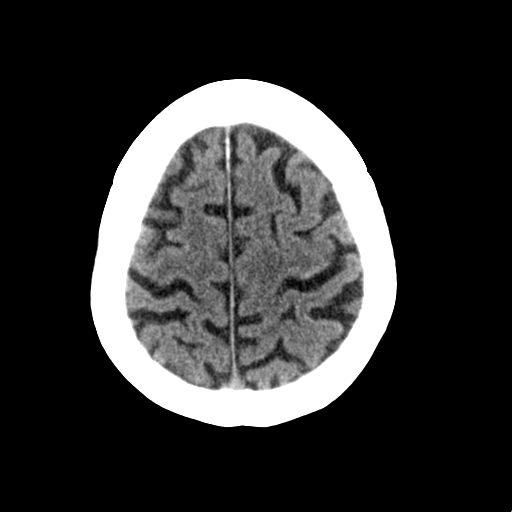
[im 26/31  brain]
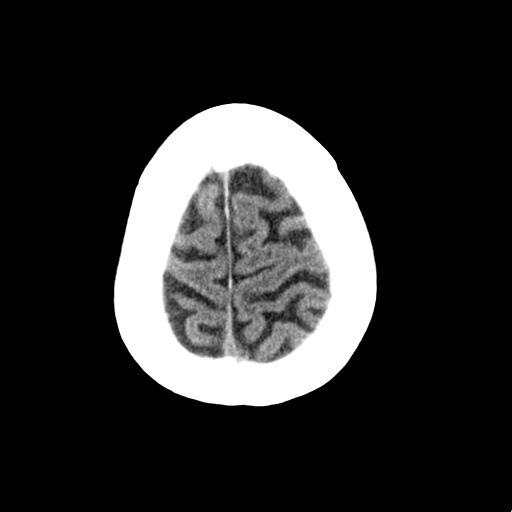
[im 28/31  brain]
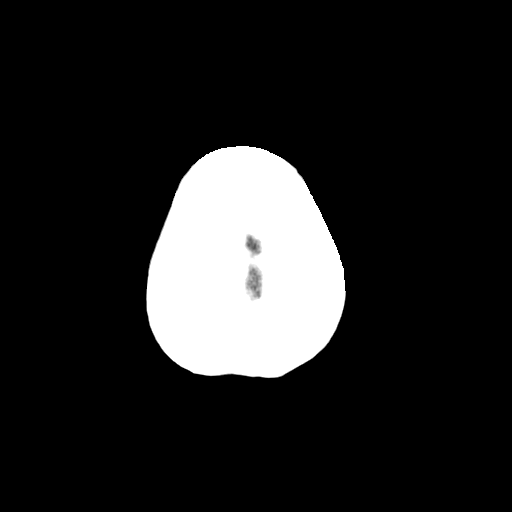
[im 28/31  bone]
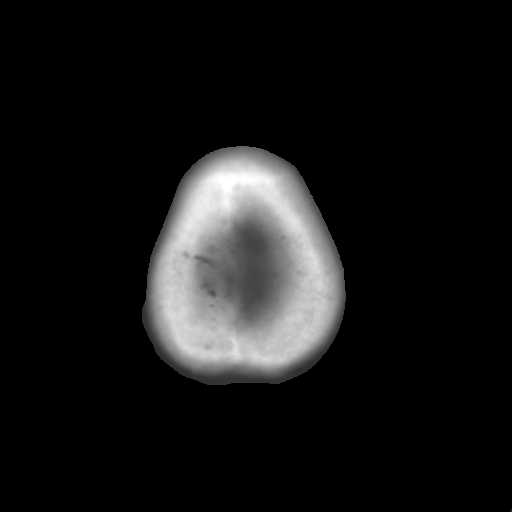

[Series 3: bone · axial · 0.42mm/px · z∈[-82,-42]mm · 3 of 31 slices shown]
[im 3/31  bone]
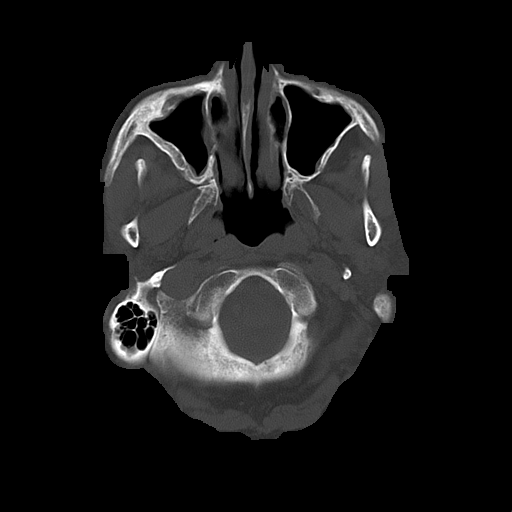
[im 7/31  bone]
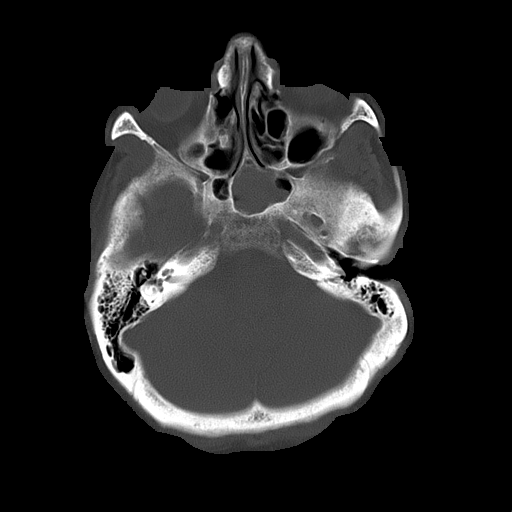
[im 11/31  bone]
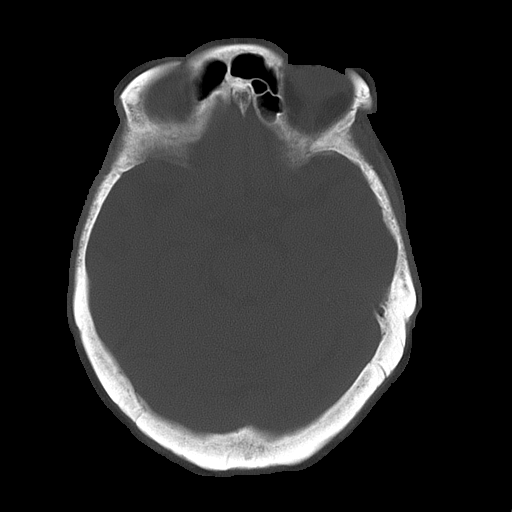

[16 of 30 positions shown; findings below may reference images not displayed]

FINDINGS: There is diffuse cortical atrophy as well as diffuse areas of low
attenuation within the subcortical, deep, and periventricular white matter
regions. There is no evidence of intra-axial or extra-axial fluid
collections, acute hemorrhage, mass effect, or a depressed skull fracture.
The visualized paranasal sinuses and mastoid air cells are patent.
IMPRESSION: 1.  Involutional and chronic changes without evidence of acute
abnormalities.
2.  This study was compared to a prior study dated 08/27/2012.
[DATE].  Dr. Ity of the Emergency Department was informed of these findings via
a preliminary faxed report.

## 2014-10-23 ENCOUNTER — Other Ambulatory Visit (INDEPENDENT_AMBULATORY_CARE_PROVIDER_SITE_OTHER): Payer: Medicare Other

## 2014-10-23 DIAGNOSIS — Z7901 Long term (current) use of anticoagulants: Secondary | ICD-10-CM

## 2014-10-23 LAB — PROTIME-INR
INR: 2.3 ratio — AB (ref 0.8–1.0)
PROTHROMBIN TIME: 24.5 s — AB (ref 9.6–13.1)

## 2014-11-07 ENCOUNTER — Telehealth: Payer: Self-pay

## 2014-11-07 NOTE — Telephone Encounter (Signed)
Called pt to schedule AWV and pt stated that she has a caregiver with her on tuesdays.  Will call back on Monday to confirm that pt can keep 12/1 INR and AWV with [facility] appt.

## 2014-11-20 ENCOUNTER — Other Ambulatory Visit: Payer: Medicare Other

## 2014-11-20 ENCOUNTER — Ambulatory Visit: Payer: Medicare Other | Admitting: Nurse Practitioner

## 2014-11-27 ENCOUNTER — Other Ambulatory Visit (INDEPENDENT_AMBULATORY_CARE_PROVIDER_SITE_OTHER): Payer: Medicare Other

## 2014-11-27 DIAGNOSIS — Z7901 Long term (current) use of anticoagulants: Secondary | ICD-10-CM

## 2014-11-27 LAB — PROTIME-INR
INR: 2.8 ratio — ABNORMAL HIGH (ref 0.8–1.0)
Prothrombin Time: 30.5 s — ABNORMAL HIGH (ref 9.6–13.1)

## 2014-12-01 ENCOUNTER — Other Ambulatory Visit: Payer: Self-pay | Admitting: Internal Medicine

## 2014-12-31 ENCOUNTER — Other Ambulatory Visit: Payer: Self-pay | Admitting: *Deleted

## 2014-12-31 ENCOUNTER — Encounter: Payer: Self-pay | Admitting: Cardiovascular Disease

## 2014-12-31 ENCOUNTER — Ambulatory Visit (INDEPENDENT_AMBULATORY_CARE_PROVIDER_SITE_OTHER): Payer: Medicare Other | Admitting: Cardiovascular Disease

## 2014-12-31 ENCOUNTER — Other Ambulatory Visit: Payer: Self-pay | Admitting: Internal Medicine

## 2014-12-31 VITALS — BP 144/78 | HR 69 | Ht 66.0 in | Wt 126.8 lb

## 2014-12-31 DIAGNOSIS — I2510 Atherosclerotic heart disease of native coronary artery without angina pectoris: Secondary | ICD-10-CM

## 2014-12-31 DIAGNOSIS — R001 Bradycardia, unspecified: Secondary | ICD-10-CM

## 2014-12-31 DIAGNOSIS — Z7901 Long term (current) use of anticoagulants: Secondary | ICD-10-CM

## 2014-12-31 DIAGNOSIS — I4891 Unspecified atrial fibrillation: Secondary | ICD-10-CM

## 2014-12-31 DIAGNOSIS — I10 Essential (primary) hypertension: Secondary | ICD-10-CM

## 2014-12-31 NOTE — Patient Instructions (Signed)
Continue same medications.   Your physician wants you to follow-up in: 6 months.  You will receive a reminder letter in the mail two months in advance. If you don't receive a letter, please call our office to schedule the follow-up appointment.  

## 2014-12-31 NOTE — Progress Notes (Signed)
HPI  Latoya Merritt is a very pleasant 81 year old woman with a history of permanent atrial fibrillation on warfarin, hyperlipidemia with notes indicating elevated liver function tests on statins, history of DVT, hypertension, bilateral mastectomy, presenting for routine followup.  She was hospitalized in November, 2013 for left-sided weakness with suspected stroke. She was off warfarin at that time due to significant left arm hematoma after a fall. She did have a non-ST elevation MI with peak troponin 2.5 felt secondary to demand ischemia. No cardiac workup was performed apart from echocardiogram. Echocardiogram showed normal LV systolic function. She has been doing reasonably well and denies any chest pain or shortness of breath. She reports chronic fatigue which has not changed.    Allergies  Allergen Reactions  . Amoxicillin     rash  . Amoxicillin-Pot Clavulanate     Swelling & rash  . Cephalexin     rash  . Cephalexin   . Clarithromycin     rash  . Clindamycin/Lincomycin     rash  . Lansoprazole   . Lisinopril     Bad dreams   . Macrodantin   . Sulfa Antibiotics   . Tussionex Pennkinetic Er [Hydrocod Polst-Cpm Polst Er]     Rash across her chest and side     Current Outpatient Prescriptions on File Prior to Visit  Medication Sig Dispense Refill  . acetaminophen (TYLENOL) 500 MG tablet Take 500 mg by mouth every 6 (six) hours as needed for pain.    . calcium carbonate (OS-CAL) 600 MG TABS Take 600 mg by mouth daily.      . Calcium Carbonate-Simethicone (MAALOX ADVANCED MAX ST) 1000-60 MG CHEW Chew 1 tablet by mouth as needed.      . carboxymethylcellulose (REFRESH PLUS) 0.5 % SOLN 1 drop 3 (three) times daily as needed.    . indapamide (LOZOL) 2.5 MG tablet TAKE 1 TABLET BY MOUTH EVERY MORNING 30 tablet 5  . ipratropium (ATROVENT) 0.06 % nasal spray Place 2 sprays into the nose daily.    . isosorbide mononitrate (IMDUR) 60 MG 24 hr tablet TAKE 1 TABLET BY MOUTH EVERY DAY  30 tablet 3  . lisinopril (PRINIVIL,ZESTRIL) 20 MG tablet Take 1 tablet (20 mg total) by mouth daily. 90 tablet 3  . nitroGLYCERIN (NITROSTAT) 0.4 MG SL tablet Place 1 tablet (0.4 mg total) under the tongue every 5 (five) minutes as needed. 90 tablet 2  . Tdap (BOOSTRIX) 5-2.5-18.5 LF-MCG/0.5 injection Inject 0.5 mLs into the muscle once. 0.5 mL 0  . warfarin (COUMADIN) 3 MG tablet TAKE 1 TABLET BY MOUTH EVERY DAY. 30 tablet 5  . zoster vaccine live, PF, (ZOSTAVAX) [id number] UNT/0.65ML injection Inject 19,400 Units into the skin once. 1 each 0   No current facility-administered medications on file prior to visit.     Past Medical History  Diagnosis Date  . Atrial fibrillation   . Polyp, stomach   . Colon polyp 2008  . Sensorineural hearing loss 2006    sudden, left ear  . DVT (deep venous thrombosis) May 2007  . History of DVT of lower extremity May 2007  . Hyperlipidemia   . Hypertension   . Stroke   . Coronary artery disease      Past Surgical History  Procedure Laterality Date  . Laser surgery for varicose veins    . Nasal polyp surgery  2008    Dr. Jenne CampusMcQueen, has been horase ever since  . Breast surgery  1972    mastectomies,  presumed due to breast CA  . Abdominal hysterectomy  1965  . Appendectomy  1950  . Tonsillectomy  1943  . Laminectomy  1975  . Thyroid surgery  1977    nodule removed     Family History  Problem Relation Age of Onset  . Cancer Other   . Heart failure Mother   . Heart failure Brother      History   Social History  . Marital Status: Married    Spouse Name: N/A    Number of Children: N/A  . Years of Education: N/A   Occupational History  . Not on file.   Social History Main Topics  . Smoking status: Never Smoker   . Smokeless tobacco: Never Used  . Alcohol Use: No     Comment: glass a wine at bedtime  . Drug Use: No  . Sexual Activity: No   Other Topics Concern  . Not on file   Social History Narrative     PHYSICAL  EXAM   BP 144/78 mmHg  Pulse 69  Ht  (1.676 m)  Wt 126 lb 12 oz (57.493 kg)  BMI 20.47 kg/m2 Constitutional: She is oriented to person, place, and time. She appears well-developed and well-nourished. No distress.  HENT: No nasal discharge.  Head: Normocephalic and atraumatic.  Eyes: Pupils are equal and round. Right eye exhibits no discharge. Left eye exhibits no discharge.  Neck: Normal range of motion. Neck supple. No JVD present. No thyromegaly present.  Cardiovascular: Normal rate, irregular rhythm, normal heart sounds. Exam reveals no gallop and no friction rub. No murmur heard.  Pulmonary/Chest: Effort normal and breath sounds normal. No stridor. No respiratory distress. She has no wheezes. She has no rales. She exhibits no tenderness.  Abdominal: Soft. Bowel sounds are normal. She exhibits no distension. There is no tenderness. There is no rebound and no guarding.  Musculoskeletal: Normal range of motion. She exhibits no edema and no tenderness.  Neurological: She is alert and oriented to person, place, and time. Coordination normal.  Skin: Skin is warm and dry. No rash noted. She is not diaphoretic. No erythema. No pallor.  Psychiatric: She has a normal mood and affect. Her behavior is normal. Judgment and thought content normal.    EKG: Atrial fibrillation  -irregular conduction  -Old anterior infarct.   ABNORMAL      ASSESSMENT AND PLAN

## 2015-01-01 ENCOUNTER — Other Ambulatory Visit (INDEPENDENT_AMBULATORY_CARE_PROVIDER_SITE_OTHER): Payer: Medicare Other

## 2015-01-01 DIAGNOSIS — Z7901 Long term (current) use of anticoagulants: Secondary | ICD-10-CM

## 2015-01-01 LAB — PROTIME-INR
INR: 2.7 ratio — ABNORMAL HIGH (ref 0.8–1.0)
Prothrombin Time: 29.1 s — ABNORMAL HIGH (ref 9.6–13.1)

## 2015-01-03 NOTE — Assessment & Plan Note (Signed)
She has no symptoms of angina. 

## 2015-01-03 NOTE — Assessment & Plan Note (Signed)
Blood pressure is reasonably controlled on current medications. 

## 2015-01-03 NOTE — Assessment & Plan Note (Signed)
Ventricular rate is controlled without any medications. Continue long-term anticoagulation.

## 2015-01-23 ENCOUNTER — Other Ambulatory Visit: Payer: Self-pay | Admitting: Internal Medicine

## 2015-02-04 ENCOUNTER — Other Ambulatory Visit: Payer: Self-pay | Admitting: Nurse Practitioner

## 2015-02-04 MED ORDER — ISOSORBIDE MONONITRATE ER 60 MG PO TB24: 60.0000 mg | ORAL_TABLET | Freq: Every day | ORAL | Status: DC

## 2015-02-05 ENCOUNTER — Other Ambulatory Visit: Payer: Medicare Other

## 2015-02-12 ENCOUNTER — Other Ambulatory Visit (INDEPENDENT_AMBULATORY_CARE_PROVIDER_SITE_OTHER): Payer: Medicare Other

## 2015-02-12 DIAGNOSIS — D6832 Hemorrhagic disorder due to extrinsic circulating anticoagulants: Secondary | ICD-10-CM

## 2015-02-12 DIAGNOSIS — T45515A Adverse effect of anticoagulants, initial encounter: Secondary | ICD-10-CM

## 2015-02-12 DIAGNOSIS — D699 Hemorrhagic condition, unspecified: Secondary | ICD-10-CM

## 2015-02-12 LAB — PROTIME-INR
INR: 2.7 ratio — AB (ref 0.8–1.0)
PROTHROMBIN TIME: 28.8 s — AB (ref 9.6–13.1)

## 2015-02-12 NOTE — Addendum Note (Signed)
Addended by: Cristela BluePULLIAM, Nancy Manuele W on: 02/12/2015 02:00 PM   Modules accepted: Orders

## 2015-02-14 ENCOUNTER — Telehealth: Payer: Self-pay | Admitting: Internal Medicine

## 2015-02-14 NOTE — Telephone Encounter (Signed)
The patient is going today to have a tooth removed . The patient did not take her warfarin last night . The dentist did not instruct her not to take her warfarin she is doing this on her own. Please advise . Her appointment to have her tooth removed is today.

## 2015-02-14 NOTE — Telephone Encounter (Signed)
She did the right thing holding her coumadin because a pulled tooth will bleed a lot on coumadin. i would not resume the coumadin until Saturday ,  Her usual dose. If they start her on an antibiotic, let us know and I will adjust her regimen,

## 2015-02-14 NOTE — Telephone Encounter (Signed)
Please advise. Patient is going for the initial evaluation to the dentist and nurse advised her that they will probably have to give her and abx. Patient is taking 1500 mg of tylenol daily for the pain. And has swelling from tooth. Patient stated that the swelling is around the inside and out. Appointment at 2.00 pm with dentist.

## 2015-02-14 NOTE — Telephone Encounter (Signed)
The patient is going today to have a tooth removed . The patient did not take her warfarin last night . The dentist did not instruct her not to take her warfarin she is doing this on her own. Please advise . Her appointment to have her tooth removed is today. °  ° ° ° ° °

## 2015-02-14 NOTE — Telephone Encounter (Signed)
Patient notified and voiced understanding repeated direction to nurse as written.

## 2015-02-20 ENCOUNTER — Other Ambulatory Visit: Payer: Self-pay | Admitting: Internal Medicine

## 2015-03-18 ENCOUNTER — Other Ambulatory Visit: Payer: Self-pay | Admitting: Internal Medicine

## 2015-03-18 MED ORDER — INDAPAMIDE 2.5 MG PO TABS
2.5000 mg | ORAL_TABLET | Freq: Every morning | ORAL | Status: DC
Start: 2015-03-18 — End: 2015-09-09

## 2015-03-18 NOTE — Telephone Encounter (Signed)
Received fax for refill on lozol 2.5 mg refill sent electronically.

## 2015-04-09 ENCOUNTER — Other Ambulatory Visit (INDEPENDENT_AMBULATORY_CARE_PROVIDER_SITE_OTHER): Payer: Medicare Other

## 2015-04-09 DIAGNOSIS — Z7901 Long term (current) use of anticoagulants: Secondary | ICD-10-CM | POA: Diagnosis not present

## 2015-04-09 LAB — PROTIME-INR
INR: 1.9 ratio — AB (ref 0.8–1.0)
Prothrombin Time: 20.5 s — ABNORMAL HIGH (ref 9.6–13.1)

## 2015-04-09 NOTE — Consult Note (Signed)
PATIENT NAME:  Latoya Merritt, Latoya G MR#:  [id number] DATE OF BIRTH:  [birth year]  DATE OF CONSULTATION:  10/22/2012  REFERRING PHYSICIAN:   CONSULTING PHYSICIAN:  Pauletta BrownsYuriy Davionte Lusby, MD  HISTORY: This is a followup visit for this pleasant 81 year old female who is admitted after sudden left upper and left lower extremity weakness and left facial droop that was observed yesterday by the patient. The patient states the symptoms started around 4 o'clock  p.m. yesterday and she was brought to the hospital. The patient has history of atrial fibrillation and, at the same time, a couple of months ago, there was a history of a fall after which Coumadin has been discontinued.   PAST MEDICAL HISTORY:  1. Hypertension.  2. Breast cancer. 3. Chronic atrial fibrillation. 4. Previous history of stroke.   DRUG ALLERGIES: Amoxicillin, Augmentin, cephalosporin, clindamycin, and sulfa drugs.   PHYSICAL EXAMINATION:  VITALS: Temperature 98.2, pulse 60, respirations 18, blood pressure 161/68, and pulse oximetry 97% on room air.  GENERAL: The patient is alert, awake, and able to tell me where she is. She was confused about the month but was able to tell me the year.   NEURO: On cranial nerve examination, there is evidence of left facial droop and diminished sensation on the left side of the face. Extraocular movements appear to be intact, but there appears to be left visual field neglect and what appears to be a left-sided homonymous hemiopsia. Hearing appears to be intact. Tongue is midline. On motor examination, tone is within normal limits. There appears to be left pronator drift that is evident. On lower extremity examination, motor strength appears to be decreased in the left lower extremity. It is also externally rotated compared to the right. There also appears to be evidence of neglect on the left side. The patient does have decreased sensation to light touch and temperature on the left upper as well as left lower  extremity. She appears to be hyperreflexive, specifically in the lower extremities, which are 3+. Babinski signs are negative. Coordination appeared to be intact, mostly on the right side, difficult to see on the left side. Gait was not assessed.   IMPRESSION AND RECOMMENDATIONS: A 81 year old female with history of atrial fibrillation, not on anticoagulation status post fall, status post CAT scan of the head that did not show any acute changes, and status post ultrasound that showed 50% stenosis on the right side and no significant abnormalities on the left side. Suspect the patient has right-sided ischemic infarct. Would do the rest of the stroke workup that would include echocardiogram, Hemoglobin A1c, lipid panel, TSH, B12, folate, and RPR. The patient is to be scheduled for MRI of the brain today. Continue aspirin. Continue blood pressure medications. I do not feel anticoagulation would be appropriate due to the patient's age and the patient's fall risk.  Thank you. We will followup this patient as imaging becomes evident.  ____________________________ Pauletta BrownsYuriy Tanush Drees, MD yz:slb D: 10/22/2012 14:26:30 ET T: 10/23/2012 14:22:33 ET JOB#: [id number]  cc: Pauletta BrownsYuriy Troyce Febo, MD, <Dictator> Pauletta BrownsYURIY Sohan Potvin MD ELECTRONICALLY SIGNED 12/06/2012 19:07

## 2015-04-09 NOTE — H&P (Signed)
PATIENT NAME:  JAHNAY, LANTIER MR#:  [id number] DATE OF BIRTH:  07/25/1920  DATE OF ADMISSION:  10/21/2012  PRIMARY CARE PHYSICIAN: Dr. Duncan Dull  CHIEF COMPLAINT: Left arm numbness and fall.   HISTORY OF PRESENT ILLNESS: This is a 81 year old female who presents to the Emergency Room secondary to left arm numbness and weakness and also suffering a fall earlier today. Patient has a history of chronic atrial fibrillation and was on anticoagulation but was taken off of it about 2 to 3 weeks ago due to some trauma to the left upper extremity and hematoma formation. She was being followed by vascular by Dr. Wyn Quaker and Dr. Gilda Crease and they were performing left upper extremity ultrasounds to follow up the hematoma or a possible underlying deep vein thrombosis. She had a recent ultrasound which showed no deep vein thrombosis, although. She apparently this morning noticed that her left arm was significantly more numb than usual. She has been keeping it elevated with a pillow due to swelling in the left upper arm. Although this morning she also fell between the dresser and her bed. She could not get herself up therefore called 911 and she was brought to the Emergency Room. Upon arrival to the ER she was noted to have a slight left-sided facial droop which is currently not appreciated and some left arm weakness and numbness. There was some concern for a possible new stroke and hospitalist services were contacted for further treatment and evaluation.   REVIEW OF SYSTEMS: CONSTITUTIONAL: No documented fever. No weight gain. No weight loss. EYES: No blurred or double vision. ENT: No tinnitus. No postnasal drip. No redness of the oropharynx. RESPIRATORY: No cough, no wheeze, no hemoptysis, no dyspnea. CARDIOVASCULAR: No chest pain, no orthopnea, no palpitations, no syncope. GASTROINTESTINAL: Positive nausea. No vomiting, no diarrhea, no abdominal pain, no melena, no hematochezia. GENITOURINARY: No dysuria, no hematuria.  ENDOCRINE: No polyuria, nocturia. No heat or cold intolerance. HEME: No anemia. No acute bleeding. Some bruising in the left upper extremity and lower extremity. MUSCULOSKELETAL: No arthritis, no swelling, no gout. NEUROLOGIC: Positive numbness in the left upper extremity. Positive left upper extremity weakness. No dysarthria, no ataxia, no seizure-type activity. PSYCH: No anxiety, no insomnia, no ADD.   PAST MEDICAL HISTORY:  1. Hypertension.  2. History of breast cancer status post left-sided mastectomy. 3. History of chronic atrial fibrillation. 4. History of previous embolic cerebrovascular accident.   ALLERGIES: Amoxicillin, Augmentin, cephalosporins, clindamycin, Macrodantin, Prevacid and sulfa drugs all of which cause a rash.   SOCIAL HISTORY: No smoking. Occasional alcohol use. No illicit drug abuse. Lives at home by herself.   FAMILY HISTORY: Father died from an myocardial infarction and mother died in her sleep of unknown causes.   CURRENT MEDICATIONS:  1. Aspirin 325 mg daily. 2. Calcium with vitamin D 1 tab b.i.d.  3. Indapamide 2.5 mg daily.  4. Imdur 30 mg daily.  5. Potassium 10 mEq b.i.d.  6. Lisinopril 20 mg daily.  7. Metoprolol 12.5 mg b.i.d.  8. Sublingual nitroglycerin as needed.  9. Saline spray as needed.   PHYSICAL EXAMINATION ON ADMISSION:  VITAL SIGNS: Temperature 97.7, pulse 65, respirations 22, blood pressure 163/72, sats 999% on room air.   GENERAL: She is a pleasant appearing female with slight left-sided facial droop but in no apparent distress.   HEENT: She is atraumatic, normocephalic. Her extraocular muscles are intact. Her pupils are equal, reactive to light. Her sclerae are anicteric. No conjunctival injection. No pharyngeal erythema.  NECK: Supple. There is no jugular venous distention. No bruits, no lymphadenopathy, no thyromegaly.   CARDIOVASCULAR: Irregular. No murmurs, no rubs, no clicks.   LUNGS: Clear to auscultation bilaterally. No  rales, no rhonchi, no wheezes.   ABDOMEN: Soft, flat, nontender, nondistended. Has good bowel sounds. No hepatosplenomegaly appreciated.   EXTREMITIES: No evidence of any cyanosis, clubbing, or peripheral edema. She has +2 pedal and radial pulses bilaterally.   NEUROLOGIC: She is alert, awake, oriented x3. She has about a 3/5 strength in the left upper extremity as compared to the right upper extremity, about 4/5 strength in her left lower extremity as compared to her right lower extremity. She has significant numbness in her left upper extremity. Reflexes are +2 bilaterally. Babinski's are downgoing bilaterally.   SKIN: Moist, warm. She has no rashes but she is noted to have some bruising in the left lower extremity, also on her left side of her trunk she has some bruising from the fall.   LYMPHATIC: There is no cervical or axillary lymphadenopathy.   LABORATORY, DIAGNOSTIC, AND RADIOLOGICAL DATA: Serum glucose 120, BUN 23, creatinine 1.2, sodium 142, potassium 3.6, chloride 107, bicarbonate 25. LFTs within normal limits. Troponin mildly elevated at 0.07. White cell count 5.1, hemoglobin 11.3, hematocrit 32.4, platelet count 167, INR 0.9.   Patient did have a CT of the head done which showed no evidence of any acute intracranial abnormalities. Chest x-ray showed volume loss in the left hemithorax, some pulmonary fibrosis.   ASSESSMENT AND PLAN: This is a 81 year old female with history of chronic atrial fibrillation, history of embolic cerebrovascular accident, hypertension, history of breast cancer status post left-sided mastectomy presents to the hospital with left arm numbness and weakness and also a fall.  1. Left arm weakness and numbness and status post fall. There is some suspicion that this could be related to an acute stroke although her CT head is negative. She was recently taken off her Coumadin because of hematoma and bruising on her left upper extremity. She has a previous history of  embolic cerebrovascular accident in December 2012. I will go ahead and get an MRI of her brain, also get a carotid duplex and echocardiogram. Will follow neuro checks every four hours. I will get a neurology consult. I discussed the case with Dr. Cristopher PeruHemang Shah. Ideally I would like to start the patient back on Coumadin but she happens to be a very high risk given her falls. I discussed with neurology and we will await MRI results to confirm there is a stroke before discussing anticoagulation options with her. I will also get a physical therapy, occupational therapy and a speech evaluation.  2. Hypertension. Continue lisinopril, metoprolol and indapamide, presently hemodynamically stable.  3. Chronic atrial fibrillation. Continue metoprolol for rate control. Hold off on anticoagulation until MRI results are obtained.  4. History of breast cancer status post mastectomy. This is currently in remission.  5. Left upper extremity hematoma. Her arm does not appear to be any bruised, any swelling. We are repeating a left upper extremity ultrasound to rule out a deep vein thrombosis presently.  6. Elevated troponin. This is likely stress mediated from her fall and a possible stroke. Will place her on telemetry. Cycle cardiac markers, continue p.r.n. nitroglycerin for now.  7. CODE STATUS: Patient is a FULL CODE.   TIME SPENT: 50 minutes.   ____________________________ Rolly PancakeVivek J. Cherlynn KaiserSainani, MD vjs:cms D: 10/21/2012 09:12:55 ET T: 10/21/2012 09:25:04 ET JOB#: [id number]  cc: Rolly PancakeVivek J. Cherlynn KaiserSainani, MD, <  Dictator> Duncan Dull, MD  Houston Siren MD ELECTRONICALLY SIGNED 10/27/2012 12:57

## 2015-04-09 NOTE — Consult Note (Signed)
PATIENT NAME:  Latoya Merritt, Latoya Merritt MR#:  [id number] DATE OF BIRTH:  July 11, 1920  DATE OF CONSULTATION:  10/21/2012  REFERRING PHYSICIAN:  Dr Cherlynn Kaiser.   CONSULTING PHYSICIAN:  Tinsleigh Slovacek K. Sherryll Burger, MD  PRIMARY CARE PHYSICIAN: Duncan Dull, MD  REASON FOR CONSULTATION: Left arm numbness and weakness after a fall.   HISTORY OF PRESENT ILLNESS: Latoya Merritt is a 81 year old Caucasian female, very verbose. Had a sudden onset of weakness of her left arm supposedly yesterday. It is difficult to define what exactly time.   Around a couple of weeks ago patient had a fall and a hematoma of her left upper extremity.   Patient was on anticoagulation with Coumadin due to her atrial fibrillation before that, and it was discontinued.   The patient also had a history of stroke in December of 2012, which caused her left upper extremity weakness.   PAST MEDICAL HISTORY:  1. Hypertension.  2. Breast cancer, status post bilateral mastectomy. 3. Chronic atrial fibrillation. 4. Previous embolic stroke.   ALLERGIES: She is allergic to amoxicillin, Augmentin, cephalosporins, clindamycin, Macrodantin, Prevacid, and sulfa drugs, all of which cause rash.   SOCIAL HISTORY: Significant that she does not smoke, does not drink alcohol. She lives by herself. Has [facility] home health helping her occasionally.    FAMILY HISTORY: Significant that her father died of myocardial infarction. Mother died for unknown reasons.   HOME MEDICATIONS:   I reviewed her home medication list.   PHYSICAL EXAMINATION:   GENERAL: She is an elderly-looking Caucasian female here by herself. She has multiple bruises. Left upper extremity looks more hyperemic than the right but the temperature is the same.     VITAL SIGNS: Her temperature is 98.5, pulse 65, respiratory rate 18, blood pressure 134/60, pulse oximetry 98% on room air.   LUNGS: Clear to auscultation.   HEART: S1, S2 heart sounds. Carotid exam did not reveal any bruit.    NEUROLOGIC: She does not have neglect. She was alert. She was oriented to October 21, 2012, Friday, in the evening time, at [facility]. She knew the current President Obama, previous Educational psychologist. She forgot [place] and then said [place] and then [place]. She was able to count the months of the year backward okay. I feel like her attention and concentrations were appropriate. She was very verbose and was telling stories about her previous life events which seemed more personality issue rather than word salad.     On her cranial nerve exam, her pupils are equal, round, and reactive. Extraocular movements are intact. I could not perform a visual field or funduscopic exam. Her hearing was intact. Her face was symmetric. Tongue was midline. Facial sensations were intact. On her motor exam she has a very interesting left upper extremity tremor. She has mildly increased tone. She has significant apraxia of both the hands, left worse than the right.   The patient has decreased aprosodia of the movement of the left upper extremity. She cannot perform any meaningful task from the left upper extremity even though she can squeeze my fingers and push out and om but for any other purposeful movement she uses a right upper extremity.   She seemed to have good strength of her bilateral lower extremities but she seemed to have apraxia of the left lower extremity as well.   Her sensations were intact to light touch. Her deep tendon reflexes were actually +3, generalized. Her toes were equivocal.    I did not check her gait.  ASSESSMENT AND PLAN: Acute onset of left upper extremity weakness and numbness and there was a potential left facial weakness which I did not appreciate as much but was appreciated by the emergency room physician earlier. Might represent new ischemic infarct. I agree with the stroke workup with MRI of the brain as well as echocardiogram or carotid ultrasound, etc.   The patient does have a known  chronic atrial fibrillation plus patient said her anticoagulation was stopped couple of weeks ago due to fall-related bruising and hematoma formation.   This is a very well known risk factor for embolic stroke.   Based on her current exam, it is hard to know whether her left upper extremity symptoms are new or left over from her previous stroke.   Until we get the MRI results we should consider this as a clinical stroke.   Her CHA2D2-VASc score is quite high so ideally she should be on anticoagulation, but with her history of recurrent falls and bruising and hematoma formation, I think she is a poor candidate for anticoagulation with Coumadin.   In the past patient mentioned that she has a history of gastric ulcers. She would prefer to take aspirin 81 mg enteric-coated which I think is a reasonable plan to do.   Avoid peristroke. Hypertension.   I do not aggressively pursue risk factor reduction in her age group.   Cognitive changes for her age and her cerebrovascular disease. Her cognition seems to be very good.   I will check out this patient to the neurology hospitalist for the weekend. Please feel free to contact me with any further questions.   ____________________________ Durene CalHemang K. Sherryll BurgerShah, MD hks:vtd D: 10/21/2012 20:47:37 ET T: 10/22/2012 10:27:26 ET JOB#: [id number] cc: Shenetta Schnackenberg K. Sherryll BurgerShah, MD, <Dictator> Durene CalHEMANG K [facility]HAH MD ELECTRONICALLY SIGNED 10/26/2012 17:44

## 2015-04-09 NOTE — Consult Note (Signed)
Brief Consult Note: Diagnosis: left UE weakness concern for stroke off coumadin.   Patient was seen by consultant.   Consult note dictated.   Comments: - pt went off coumadin 2 wks ago and had developed acute onset left UE weakness. Has apraxia and tremors on exam (hard to know how much weakness is new). - Has another stroke in 11/2011 with Left sided weakness. - Agree with stroke w/up. - Even though her CHA2D2 VASc score is high she is not a good candidate for anticoagulation due to recurrent falls and recent hematoma etc. - Pt has h/o gastric ulcer with high dose NSAID - she request ASA 81 mg EC. - will check out to covering neuro-hospitalist over the weekend.  Electronic Signatures: Jolene ProvostShah, Cammi Consalvo Kalpeshkumar (MD)  (Signed [phone]-Nov-13 20:39)  Authored: Brief Consult Note   Last Updated: 01-Nov-13 20:39 by Jolene ProvostShah, Purnell Daigle Kalpeshkumar (MD)

## 2015-04-09 NOTE — Consult Note (Signed)
Brief Consult Note: Diagnosis: NSTEMI ? however no chest pain or other ischemic symptoms (could be embolic due to A-fib), Possible stroke.   Patient was seen by consultant.   Consult note dictated.   Discussed with Attending MD.   Comments: I will check echocardiogram.  Will treat medically given lack of anginal symptoms.  In spite of recent falls and hemtoma, I still think that benefit of anticoagulation outweights the risks given that she is at extremely high risk of thromboembolic complications related to chronic A-fib.  Alternatives to Warfarin are preferred. Either Xarelto 15 mg once daily Eliquis 2.5 mg bid.  Electronic Signatures: Lorine BearsArida, Kayvion Arneson (MD)  (Signed [phone]-Nov-13 12:23)  Authored: Brief Consult Note   Last Updated: 02-Nov-13 12:23 by Lorine BearsArida, Marshall Kampf (MD)

## 2015-04-09 NOTE — Discharge Summary (Signed)
PATIENT NAME:  Latoya Merritt, Latoya Merritt MR#:  [id number] DATE OF BIRTH:  Jul 31, 1920  DATE OF ADMISSION:  10/21/2012 DATE OF DISCHARGE:  10/23/2012  PRIMARY CARE PHYSICIAN: Dr. Darrick Huntsmanullo   CARDIOLOGIST: Dr. Mariah MillingGollan   FINAL DIAGNOSES:  1. Subacute cerebrovascular accident with left-sided weakness and incoordination.  2. Elevated troponin, possible acute myocardial infarction.  3. Hypertension and bradycardia.  4. Hyperlipidemia.  5. Atrial fibrillation.   MEDICATIONS ON DISCHARGE:  1. Inderal 30 mg daily.  2. Lisinopril 20 mg daily.  3. Calcium Carbonate with Vitamin D 600 mg/400 IU twice a day.  4. Saline nasal spray as needed.  5. Optive solution one drop each affected eye twice a day.  6. Nitroglycerin 0.4 mg sublingually every five minutes as needed.  7. Aspirin 325 mg daily.  8. Klor-Con 10 mEq twice a day.  9. Metoprolol ER 25 mg at bedtime.  10. Simvastatin 20 mg at bedtime.  11. Warfarin 6 mg daily.   DO NOT TAKE: Indapamide.  REFERRALS: Home health physical therapy nurse and nurse's aide to help with medications and strength.   HOME OXYGEN: None.    DIET: Low sodium diet, regular consistency.   ACTIVITY: Activity as tolerated.   FOLLOW-UP:  1. Follow-up with Dr. Mariah MillingGollan. Keep appointment.  2. Follow-up this week with Dr. Darrick Huntsmanullo. Check Coumadin level and PT/INR on Wednesday.   REASON FOR ADMISSION: The patient was admitted 10/21/2012 and discharged 10/23/2012. She came in with left arm numbness and fall.   HISTORY OF PRESENT ILLNESS: The patient is a 81 year old female who presented with left arm numbness, weakness, had a fall earlier in the day of admission. Has a history of chronic atrial fibrillation, was taken off Coumadin 2 to 3 weeks ago after a hematoma. She was admitted to the hospital with suspected stroke. MRI of the brain was ordered. Carotid ultrasound and echocardiogram ordered.  CONSULTANTS DURING THE HOSPITAL STAY:  1. Dr. Sherryll BurgerShah of Neurology  2. Dr. [name] of  Cardiology  3. Physical therapy   LABORATORY AND RADIOLOGICAL DATA DURING THE HOSPITAL COURSE: Glucose 117. First troponin borderline at 0.07. INR 0.9. White blood cell count 5.1, hemoglobin and hematocrit 11.3 and 32.4, platelet count 167, glucose 120, BUN 23, creatinine 1.2, sodium 142, potassium 3.6, chloride 107, CO2 25, calcium 9.0. Liver function tests normal range.   EKG showed atrial fibrillation, anterior septal infarct.  CT scan of the head negative.   Chest x-ray volume loss and left hemithorax, possible underlying pulmonary fibrosis.   Urinalysis 1+ blood, otherwise negative.   Ultrasound of the carotids showed right internal carotid borderline 50% stenosis. No hemodynamically significant stenosis.   Ultrasound of the left upper extremity no evidence of DVT. A hypoechoic mass in the left upper arm, nonspecific, slightly decreased from prior. May represent a hematoma.   Next troponin went up to 1.13. Next troponin 2.4. Troponin upon discharge titrated down to 2.2. LDL 109, HDL 61, triglycerides 137.  Echocardiogram EF greater than 55%, severe biatrial enlargement, moderate mitral regurgitation, right ventricular systolic pressure elevated at 30 to 40.  MRI of the brain showed findings consistent with a subacute ischemic change in the right parietal region predominantly from the sylvian fissure and superiorly into the posterior parietal to parietal occipital region on the right. No hemorrhage.   HOSPITAL COURSE: The patient walked well with physical therapy on the day of discharge, 350 feet with rolling walker. The patient was discharged home in stable condition.  1. With the subacute cerebrovascular accident  with left-sided weakness and incoordination, the patient is on aspirin already. Her anticoagulation was stopped a few weeks back with a fall. Since the patient does have atrial fibrillation and a subacute stroke, it was believed best to go back on the anticoagulation. The  patient will be restarted on Coumadin 6 mg daily, a PT/INR on Wednesday with results to Dr. Darrick Huntsman. Close clinical follow-up. The patient does live alone so home health PT and home health aide set up. Did speak with neighbor who checks in on her. She will be watched closely as outpatient. Benefits and risks of anticoagulation explained to the patient. Since today was Sunday I was unable to get a pricing check on Xarelto so the patient decided to go back on her Coumadin. It will take a few days for her level to become therapeutic. The patient's strength was better and coordination was better upon discharge. The patient still felt some subjective weakness on the left side.  2. For the patient's elevated troponin, possible acute myocardial infarction, the patient was seen in consultation by Dr. Kirke Corin. Since the patient did not have any symptoms of chest pain or shortness of breath, the patient will be treated medically and no cardiac cath will be done at this time. The patient is on aspirin and metoprolol.  3. For the patient's hypertension and bradycardia, the patient's metoprolol was held. This was upon discharge changed to Toprol-XL 25 mg at night. Her indapamide was stopped and her Imdur and lisinopril were continued.  4. For her hyperlipidemia, statin was started because her LDL is greater than 100 with the acute stroke and myocardial infarction.  5. Atrial fibrillation. The patient is on aspirin and metoprolol. She is rate controlled. Coumadin will be restarted for stroke prevention. Of note, I did tell the patient that drinking alcohol and blood thinner is not a good idea.     TIME SPENT ON DISCHARGE: 35 minutes.   ____________________________ Herschell Dimes. Renae Gloss, MD rjw:drc D: 10/23/2012 13:54:47 ET T: 10/24/2012 11:45:55 ET JOB#: [id number]  cc: Herschell Dimes. Renae Gloss, MD, <Dictator> Duncan Dull, MD Antonieta Iba, MD Salley Scarlet MD ELECTRONICALLY SIGNED 10/29/2012 17:51

## 2015-04-09 NOTE — Consult Note (Signed)
PATIENT NAME:  Latoya Merritt, Latoya Merritt MR#:  [id number] DATE OF BIRTH:  [birth year]  DATE OF CONSULTATION:  10/22/2012  REFERRING PHYSICIAN:  Alford Highlandichard Wieting, MD CONSULTING PHYSICIAN:  Muhammad A. [name], MD  PRIMARY CARE PHYSICIAN:  Dr. Darrick Huntsmanullo.   REASON FOR CONSULTATION: Myocardial infarction.   HISTORY OF PRESENT ILLNESS:  This is a 81 year old female with known history of chronic atrial fibrillation who presented to the Emergency Room with left arm numbness and weakness after she suffered a fall. The patient has previous history of embolic stroke also in December 2012. Recently, she had recurrent falls and left arm hematoma. She was taken off warfarin due to that. She has been off anticoagulation for a month. The patient is suspected of having another ischemic stroke. Her cardiac enzymes were noted to be elevated with a troponin of 2.5. The patient denies any chest pain or dyspnea at this time.   PAST MEDICAL HISTORY:  1. Hypertension.  2. History of breast cancer status post left-sided mastectomy.  3. Chronic atrial fibrillation.  4. Previous ischemic stroke, likely due to embolism from atrial fibrillation.   ALLERGIES: Amoxicillin, Augmentin, cephalosporins, clindamycin, Prevacid and sulfa drugs.   SOCIAL HISTORY: Negative for smoking, alcohol, or recreational drug use.   FAMILY HISTORY: Negative for premature coronary artery disease.   HOME MEDICATIONS:  1. Aspirin 325 mg daily.  2. Calcium with vitamin D twice daily.  3. Indapamide 2.5 mg once daily.  4. Imdur 30 mg daily.  5. Potassium 10 mEq twice a day.  6. Lisinopril 20 mg once a day.  7. Metoprolol 12.5 mg twice daily.  8. Sublingual nitroglycerin as needed.   REVIEW OF SYSTEMS: A ten point review of systems was performed. It is negative other than what is mentioned in the history of present illness.   PHYSICAL EXAMINATION:  GENERAL: The patient appears to be younger than her stated age in no acute distress.   VITAL SIGNS:  Temperature 97.3, pulse 53, respiratory rate 16, blood pressure 134/53 and oxygen saturation is 98% on 2 liters nasal cannula.   HEENT: Normocephalic, atraumatic.   NECK: No jugular venous distention or carotid bruits.   RESPIRATORY: Normal respiratory effort with no use of accessory muscles. Auscultation reveals normal breath sounds.   CARDIOVASCULAR: Normal PMI. Irregularly irregular with no gallops or murmurs.   ABDOMEN: Benign, nontender, and nondistended.   EXTREMITIES: No clubbing, cyanosis, or edema.   SKIN: Warm and dry with no rash.   PSYCHIATRIC: She is alert, oriented x3 with normal mood and affect.   LABORATORY, DIAGNOSTIC, AND RADIOLOGICAL DATA: Renal function is normal. Potassium was slightly low at 3.4. Troponin went up from 0.07 to 2.4. Hemoglobin was 11.5. INR was 0.9. ECG showed atrial fibrillation with poor R wave progression in the anterior leads. No significant ST or T wave changes.   IMPRESSION:  1. Questionable non-ST elevation myocardial infarction, could be embolic due to atrial fibrillation.  2. Probable acute ischemic stroke. Likely related to atrial fibrillation.  3. Chronic atrial fibrillation, currently not on anticoagulation.   RECOMMENDATIONS: The patient's cardiac enzymes were significantly elevated. However, she has no symptoms suggestive of angina. She denies chest pain or dyspnea. Thus, I recommend continuing medical therapy and blood pressure control. I will check her echocardiogram to ensure she does not have an intracardiac thrombus responsible for an embolic phenomena. I think the bigger issue here is the issue of long-term anticoagulation to decrease her chances of future cardioembolic complications. In spite of her recent  falls and left arm hematoma, I still think the benefits of anticoagulation outweigh the risks. This patient is at extremely high risk for recurrent stroke without anticoagulation. It is possible that some of the recent bleeding  complications might have been related to difficulty in controlling her INR, especially when she was on antibiotics. I think it makes sense to switch her to one of the novel oral anticoagulation agents. In her situation, I recommend either Xarelto 15 mg once daily or Eliquis 2.5 mg twice daily.   ____________________________ Chelsea Aus Kirke Corin, MD maa:ap D: 10/22/2012 12:32:44 ET T: 10/22/2012 12:45:54 ET JOB#: [id number]  cc: Muhammad A. Kirke Corin, MD, <Dictator> Duncan Dull, MD Jerolyn Center Argentina Donovan MD ELECTRONICALLY SIGNED 10/23/2012 12:33

## 2015-05-07 ENCOUNTER — Other Ambulatory Visit (INDEPENDENT_AMBULATORY_CARE_PROVIDER_SITE_OTHER): Payer: Medicare Other

## 2015-05-07 DIAGNOSIS — Z7901 Long term (current) use of anticoagulants: Secondary | ICD-10-CM | POA: Diagnosis not present

## 2015-05-07 LAB — PROTIME-INR
INR: 1.7 ratio — ABNORMAL HIGH (ref 0.8–1.0)
Prothrombin Time: 18.8 s — ABNORMAL HIGH (ref 9.6–13.1)

## 2015-05-08 MED ORDER — WARFARIN SODIUM 1 MG PO TABS: 1.0000 mg | ORAL_TABLET | Freq: Every day | ORAL | Status: DC

## 2015-05-08 NOTE — Assessment & Plan Note (Signed)
INR is low , 1.7 on 3 mg daily,  Increase dose to 4 mg daily  1 m g tablet sent to local pharmacy

## 2015-05-08 NOTE — Addendum Note (Signed)
Addended by: Sherlene ShamsULLO, Kyron Schlitt L on: 05/08/2015 05:35 PM   Modules accepted: Orders, Medications

## 2015-05-09 NOTE — Progress Notes (Signed)
Coumadin 1 mg called to pharmacy.

## 2015-05-14 ENCOUNTER — Other Ambulatory Visit (INDEPENDENT_AMBULATORY_CARE_PROVIDER_SITE_OTHER): Payer: Medicare Other

## 2015-05-14 DIAGNOSIS — Z7901 Long term (current) use of anticoagulants: Secondary | ICD-10-CM | POA: Diagnosis not present

## 2015-05-14 LAB — PROTIME-INR
INR: 2.8 ratio — ABNORMAL HIGH (ref 0.8–1.0)
Prothrombin Time: 29.9 s — ABNORMAL HIGH (ref 9.6–13.1)

## 2015-06-11 ENCOUNTER — Other Ambulatory Visit (INDEPENDENT_AMBULATORY_CARE_PROVIDER_SITE_OTHER): Payer: Medicare Other

## 2015-06-11 DIAGNOSIS — Z7901 Long term (current) use of anticoagulants: Secondary | ICD-10-CM | POA: Diagnosis not present

## 2015-06-11 LAB — PROTIME-INR
INR: 3 ratio — ABNORMAL HIGH (ref 0.8–1.0)
PROTHROMBIN TIME: 32.4 s — AB (ref 9.6–13.1)

## 2015-06-24 ENCOUNTER — Other Ambulatory Visit: Payer: Self-pay | Admitting: Internal Medicine

## 2015-07-09 ENCOUNTER — Other Ambulatory Visit (INDEPENDENT_AMBULATORY_CARE_PROVIDER_SITE_OTHER): Payer: Medicare Other

## 2015-07-09 DIAGNOSIS — Z7901 Long term (current) use of anticoagulants: Secondary | ICD-10-CM | POA: Diagnosis not present

## 2015-07-09 LAB — PROTIME-INR
INR: 2.2 ratio — ABNORMAL HIGH (ref 0.8–1.0)
PROTHROMBIN TIME: 24.2 s — AB (ref 9.6–13.1)

## 2015-07-15 ENCOUNTER — Other Ambulatory Visit: Payer: Self-pay

## 2015-07-15 ENCOUNTER — Ambulatory Visit (INDEPENDENT_AMBULATORY_CARE_PROVIDER_SITE_OTHER): Payer: Medicare Other | Admitting: Cardiovascular Disease

## 2015-07-15 ENCOUNTER — Encounter: Payer: Self-pay | Admitting: Cardiovascular Disease

## 2015-07-15 VITALS — BP 158/78 | HR 71 | Ht 66.0 in | Wt 127.2 lb

## 2015-07-15 DIAGNOSIS — I10 Essential (primary) hypertension: Secondary | ICD-10-CM

## 2015-07-15 DIAGNOSIS — I4891 Unspecified atrial fibrillation: Secondary | ICD-10-CM

## 2015-07-15 MED ORDER — NITROGLYCERIN 0.4 MG SL SUBL: 0.4000 mg | SUBLINGUAL_TABLET | SUBLINGUAL | Status: DC | PRN

## 2015-07-15 NOTE — Assessment & Plan Note (Signed)
Blood pressure is mildly elevated at recent readings have been reasonable. Continue current medications.

## 2015-07-15 NOTE — Progress Notes (Signed)
HPI  Latoya Merritt is a very pleasant 81 year old woman with a history of permanent atrial fibrillation on warfarin, hyperlipidemia with notes indicating elevated liver function tests on statins, history of DVT, hypertension, bilateral mastectomy, presenting for routine followup.  She was hospitalized in November, 2013 for left-sided weakness with suspected stroke. She was off warfarin at that time due to significant left arm hematoma after a fall. She did have a non-ST elevation MI with peak troponin 2.5 felt secondary to demand ischemia. No cardiac workup was performed apart from echocardiogram. Echocardiogram showed normal LV systolic function. She has been doing reasonably well and denies any chest pain or shortness of breath. No palpitations, dizziness or syncope.   Allergies  Allergen Reactions  . Amoxicillin     rash  . Amoxicillin-Pot Clavulanate     Swelling & rash  . Cephalexin     rash  . Cephalexin   . Clarithromycin     rash  . Clindamycin/Lincomycin     rash  . Lansoprazole   . Lisinopril     Bad dreams   . Macrodantin   . Sulfa Antibiotics   . Tussionex Pennkinetic Er [Hydrocod Polst-Cpm Polst Er]     Rash across her chest and side     Current Outpatient Prescriptions on File Prior to Visit  Medication Sig Dispense Refill  . acetaminophen (TYLENOL) 500 MG tablet Take 500 mg by mouth every 6 (six) hours as needed for pain.    . calcium carbonate (OS-CAL) 600 MG TABS Take 600 mg by mouth daily.      . Calcium Carbonate-Simethicone (MAALOX ADVANCED MAX ST) 1000-60 MG CHEW Chew 1 tablet by mouth as needed.      . carboxymethylcellulose (REFRESH PLUS) 0.5 % SOLN 1 drop 3 (three) times daily as needed.    . indapamide (LOZOL) 2.5 MG tablet Take 1 tablet (2.5 mg total) by mouth every morning. 30 tablet 5  . ipratropium (ATROVENT) 0.06 % nasal spray Place 2 sprays into the nose daily.    . isosorbide mononitrate (IMDUR) 60 MG 24 hr tablet Take 1 tablet (60 mg total) by  mouth daily. 30 tablet 6  . lisinopril (PRINIVIL,ZESTRIL) 20 MG tablet Take 1 tablet (20 mg total) by mouth daily. 90 tablet 3  . potassium chloride (K-DUR) 10 MEQ tablet TAKE 1 TABLET BY MOUTH TWICE A DAY 60 tablet 5  . Tdap (BOOSTRIX) 5-2.5-18.5 LF-MCG/0.5 injection Inject 0.5 mLs into the muscle once. 0.5 mL 0  . warfarin (COUMADIN) 1 MG tablet Take 1 tablet (1 mg total) by mouth daily. 30 tablet 2  . warfarin (COUMADIN) 3 MG tablet TAKE 1 TABLET BY MOUTH EVERY DAY 30 tablet 5  . zoster vaccine live, PF, (ZOSTAVAX) 16109 UNT/0.65ML injection Inject 19,400 Units into the skin once. 1 each 0   No current facility-administered medications on file prior to visit.     Past Medical History  Diagnosis Date  . Atrial fibrillation   . Polyp, stomach   . Colon polyp 2008  . Sensorineural hearing loss 2006    sudden, left ear  . DVT (deep venous thrombosis) May 2007  . History of DVT of lower extremity May 2007  . Hyperlipidemia   . Hypertension   . Stroke   . Coronary artery disease      Past Surgical History  Procedure Laterality Date  . Laser surgery for varicose veins    . Nasal polyp surgery  2008    Dr. Jenne Campus, has been  horase ever since  . Breast surgery  1972    mastectomies, presumed due to breast CA  . Abdominal hysterectomy  1965  . Appendectomy  1950  . Tonsillectomy  1943  . Laminectomy  1975  . Thyroid surgery  1977    nodule removed     Family History  Problem Relation Age of Onset  . Cancer Other   . Heart failure Mother   . Heart failure Brother      History   Social History  . Marital Status: Married    Spouse Name: N/A  . Number of Children: N/A  . Years of Education: N/A   Occupational History  . Not on file.   Social History Main Topics  . Smoking status: Never Smoker   . Smokeless tobacco: Never Used  . Alcohol Use: No     Comment: glass a wine at bedtime  . Drug Use: No  . Sexual Activity: No   Other Topics Concern  . Not on file    Social History Narrative     PHYSICAL EXAM   BP 158/78 mmHg  Pulse 71  Ht 5\' 6"  (1.676 m)  Wt 127 lb 4 oz (57.72 kg)  BMI 20.55 kg/m2 Constitutional: She is oriented to person, place, and time. She appears well-developed and well-nourished. No distress.  HENT: No nasal discharge.  Head: Normocephalic and atraumatic.  Eyes: Pupils are equal and round. Right eye exhibits no discharge. Left eye exhibits no discharge.  Neck: Normal range of motion. Neck supple. No JVD present. No thyromegaly present.  Cardiovascular: Normal rate, irregular rhythm, normal heart sounds. Exam reveals no gallop and no friction rub. No murmur heard.  Pulmonary/Chest: Effort normal and breath sounds normal. No stridor. No respiratory distress. She has no wheezes. She has no rales. She exhibits no tenderness.  Abdominal: Soft. Bowel sounds are normal. She exhibits no distension. There is no tenderness. There is no rebound and no guarding.  Musculoskeletal: Normal range of motion. She exhibits no edema and no tenderness.  Neurological: She is alert and oriented to person, place, and time. Coordination normal.  Skin: Skin is warm and dry. No rash noted. She is not diaphoretic. No erythema. No pallor.  Psychiatric: She has a normal mood and affect. Her behavior is normal. Judgment and thought content normal.    EKG: Atrial fibrillation  - occasional ectopic ventricular beat    -Old anterior infarct.   ABNORMAL      ASSESSMENT AND PLAN

## 2015-07-15 NOTE — Patient Instructions (Signed)
Medication Instructions: Continue same medications.   Labwork: None.   Procedures/Testing: None.   Follow-Up: 6 months with Dr. Arida.   Any Additional Special Instructions Will Be Listed Below (If Applicable).   

## 2015-07-15 NOTE — Assessment & Plan Note (Signed)
The patient is doing very well from a cardiac standpoint. Ventricular rate is controlled without any medications. She is on long-term anticoagulation with warfarin with no recent complications. INR has been therapeutic and is followed by Dr. Darrick Huntsman.

## 2015-07-25 ENCOUNTER — Other Ambulatory Visit: Payer: Self-pay | Admitting: Internal Medicine

## 2015-08-05 ENCOUNTER — Other Ambulatory Visit: Payer: Self-pay | Admitting: *Deleted

## 2015-08-05 DIAGNOSIS — Z7901 Long term (current) use of anticoagulants: Secondary | ICD-10-CM

## 2015-08-06 ENCOUNTER — Other Ambulatory Visit (INDEPENDENT_AMBULATORY_CARE_PROVIDER_SITE_OTHER): Payer: Medicare Other

## 2015-08-06 DIAGNOSIS — Z7901 Long term (current) use of anticoagulants: Secondary | ICD-10-CM

## 2015-08-06 LAB — PROTIME-INR
INR: 3.7 ratio — ABNORMAL HIGH (ref 0.8–1.0)
Prothrombin Time: 39.4 s — ABNORMAL HIGH (ref 9.6–13.1)

## 2015-08-20 ENCOUNTER — Other Ambulatory Visit (INDEPENDENT_AMBULATORY_CARE_PROVIDER_SITE_OTHER): Payer: Medicare Other

## 2015-08-20 DIAGNOSIS — Z79899 Other long term (current) drug therapy: Secondary | ICD-10-CM

## 2015-08-20 DIAGNOSIS — Z7901 Long term (current) use of anticoagulants: Secondary | ICD-10-CM | POA: Diagnosis not present

## 2015-08-20 LAB — PROTIME-INR
INR: 1.7 ratio — AB (ref 0.8–1.0)
Prothrombin Time: 18.4 s — ABNORMAL HIGH (ref 9.6–13.1)

## 2015-08-22 MED ORDER — WARFARIN SODIUM 3 MG PO TABS: 3.0000 mg | ORAL_TABLET | Freq: Every day | ORAL | Status: DC

## 2015-08-22 MED ORDER — WARFARIN SODIUM 1 MG PO TABS: ORAL_TABLET | ORAL | Status: DC

## 2015-08-22 NOTE — Addendum Note (Signed)
Addended by: Sherlene Shams on: 08/22/2015 05:04 PM   Modules accepted: Orders

## 2015-08-22 NOTE — Addendum Note (Signed)
Addended by: Sherlene Shams on: 08/22/2015 02:50 PM   Modules accepted: Orders

## 2015-08-26 ENCOUNTER — Other Ambulatory Visit: Payer: Self-pay | Admitting: Nurse Practitioner

## 2015-08-29 ENCOUNTER — Encounter: Payer: Self-pay | Admitting: Internal Medicine

## 2015-09-03 ENCOUNTER — Other Ambulatory Visit (INDEPENDENT_AMBULATORY_CARE_PROVIDER_SITE_OTHER): Payer: Medicare Other

## 2015-09-03 DIAGNOSIS — Z79899 Other long term (current) drug therapy: Secondary | ICD-10-CM | POA: Diagnosis not present

## 2015-09-03 DIAGNOSIS — Z7901 Long term (current) use of anticoagulants: Secondary | ICD-10-CM

## 2015-09-03 LAB — CBC WITH DIFFERENTIAL/PLATELET
BASOS ABS: 0 10*3/uL (ref 0.0–0.1)
BASOS PCT: 0.8 % (ref 0.0–3.0)
Eosinophils Absolute: 0.1 10*3/uL (ref 0.0–0.7)
Eosinophils Relative: 1.6 % (ref 0.0–5.0)
HEMATOCRIT: 36 % (ref 36.0–46.0)
HEMOGLOBIN: 12 g/dL (ref 12.0–15.0)
LYMPHS PCT: 44.7 % (ref 12.0–46.0)
Lymphs Abs: 2.4 10*3/uL (ref 0.7–4.0)
MCHC: 33.3 g/dL (ref 30.0–36.0)
MCV: 94.3 fl (ref 78.0–100.0)
MONOS PCT: 7.7 % (ref 3.0–12.0)
Monocytes Absolute: 0.4 10*3/uL (ref 0.1–1.0)
NEUTROS ABS: 2.4 10*3/uL (ref 1.4–7.7)
Neutrophils Relative %: 45.2 % (ref 43.0–77.0)
PLATELETS: 197 10*3/uL (ref 150.0–400.0)
RBC: 3.82 Mil/uL — ABNORMAL LOW (ref 3.87–5.11)
RDW: 13.6 % (ref 11.5–15.5)
WBC: 5.4 10*3/uL (ref 4.0–10.5)

## 2015-09-03 LAB — PROTIME-INR
INR: 2.4 ratio — ABNORMAL HIGH (ref 0.8–1.0)
PROTHROMBIN TIME: 25.8 s — AB (ref 9.6–13.1)

## 2015-09-03 LAB — COMPREHENSIVE METABOLIC PANEL
ALBUMIN: 3.9 g/dL (ref 3.5–5.2)
ALK PHOS: 47 U/L (ref 39–117)
ALT: 10 U/L (ref 0–35)
AST: 21 U/L (ref 0–37)
BILIRUBIN TOTAL: 0.6 mg/dL (ref 0.2–1.2)
BUN: 15 mg/dL (ref 6–23)
CO2: 29 mEq/L (ref 19–32)
CREATININE: 1.22 mg/dL — AB (ref 0.40–1.20)
Calcium: 9.5 mg/dL (ref 8.4–10.5)
Chloride: 103 mEq/L (ref 96–112)
GFR: 43.68 mL/min — ABNORMAL LOW (ref >60.00)
GLUCOSE: 93 mg/dL (ref 70–99)
POTASSIUM: 4 meq/L (ref 3.5–5.1)
SODIUM: 140 meq/L (ref 135–145)
TOTAL PROTEIN: 6.9 g/dL (ref 6.0–8.3)

## 2015-09-09 ENCOUNTER — Other Ambulatory Visit: Payer: Self-pay | Admitting: Internal Medicine

## 2015-09-09 NOTE — Telephone Encounter (Signed)
Last OV 8.18.15.  Please advise refill

## 2015-09-10 NOTE — Telephone Encounter (Signed)
Ok to refill,  Refill sent  

## 2015-10-01 ENCOUNTER — Other Ambulatory Visit (INDEPENDENT_AMBULATORY_CARE_PROVIDER_SITE_OTHER): Payer: Medicare Other

## 2015-10-01 DIAGNOSIS — Z7901 Long term (current) use of anticoagulants: Secondary | ICD-10-CM

## 2015-10-01 LAB — PROTIME-INR
INR: 2.2 ratio — AB (ref 0.8–1.0)
Prothrombin Time: 24.3 s — ABNORMAL HIGH (ref 9.6–13.1)

## 2015-10-03 ENCOUNTER — Telehealth: Payer: Self-pay | Admitting: Internal Medicine

## 2015-10-03 NOTE — Telephone Encounter (Signed)
Pt called about wanting to get her lab results and her warfarin (COUMADIN) 3 MG tablet medication. Also needs assistance on how to take the medication after results. Pharmacy is [facility] in [place]. Thank You!

## 2015-10-03 NOTE — Telephone Encounter (Signed)
Latoya MessierKathy took care of this request.

## 2015-10-24 ENCOUNTER — Other Ambulatory Visit: Payer: Self-pay | Admitting: Internal Medicine

## 2015-10-24 DIAGNOSIS — Z7901 Long term (current) use of anticoagulants: Secondary | ICD-10-CM

## 2015-10-24 NOTE — Telephone Encounter (Signed)
Please advise-You have not seen this patient in over a year

## 2015-10-26 MED ORDER — LISINOPRIL 20 MG PO TABS: 20.0000 mg | ORAL_TABLET | Freq: Every day | ORAL | Status: DC

## 2015-10-26 NOTE — Telephone Encounter (Signed)
90 day supply authorized and sent .  She has seen Dr [name] recently

## 2015-10-29 ENCOUNTER — Ambulatory Visit (INDEPENDENT_AMBULATORY_CARE_PROVIDER_SITE_OTHER): Payer: Medicare Other

## 2015-10-29 ENCOUNTER — Other Ambulatory Visit (INDEPENDENT_AMBULATORY_CARE_PROVIDER_SITE_OTHER): Payer: Medicare Other

## 2015-10-29 DIAGNOSIS — Z7901 Long term (current) use of anticoagulants: Secondary | ICD-10-CM | POA: Diagnosis not present

## 2015-10-29 DIAGNOSIS — R0602 Shortness of breath: Secondary | ICD-10-CM

## 2015-10-29 DIAGNOSIS — Z23 Encounter for immunization: Secondary | ICD-10-CM

## 2015-10-29 LAB — PROTIME-INR
INR: 1.8 ratio — AB (ref 0.8–1.0)
PROTHROMBIN TIME: 19.8 s — AB (ref 9.6–13.1)

## 2015-10-29 NOTE — Progress Notes (Signed)
Patient came in for flu and pneumonia vaccines.  Received in right deltoid.  Patient tolerated well.

## 2015-11-05 ENCOUNTER — Other Ambulatory Visit (INDEPENDENT_AMBULATORY_CARE_PROVIDER_SITE_OTHER): Payer: Medicare Other

## 2015-11-05 DIAGNOSIS — Z7901 Long term (current) use of anticoagulants: Secondary | ICD-10-CM | POA: Diagnosis not present

## 2015-11-05 LAB — PROTIME-INR
INR: 1.7 ratio — AB (ref 0.8–1.0)
PROTHROMBIN TIME: 18.4 s — AB (ref 9.6–13.1)

## 2015-11-11 ENCOUNTER — Telehealth: Payer: Self-pay | Admitting: Internal Medicine

## 2015-11-11 ENCOUNTER — Other Ambulatory Visit: Payer: Self-pay

## 2015-11-11 MED ORDER — WARFARIN SODIUM 3 MG PO TABS: 3.0000 mg | ORAL_TABLET | Freq: Every day | ORAL | Status: DC

## 2015-11-11 NOTE — Telephone Encounter (Signed)
Pt called back about her medication warfarin (coumadin) 3 mg tablet she states she needs a prescription for the warfarin 3 mg. She says she has the 1 mg but not the 3 mg. [facility] is [id number] - [place], [place] - [address] [facility] [address]. Thank You!

## 2015-11-11 NOTE — Telephone Encounter (Signed)
Spoke with the patient, she verbally understood results, scheduled lab draw for December.

## 2015-11-11 NOTE — Telephone Encounter (Signed)
Pt called about needing instructions on how to take the warfarin (COUMADIN) 3 MG tablet from her lab results. Pt called in on 11/03/2015 and I took her message about needing to know her lab results so that she can taker her medication properly. Thank you!

## 2015-11-11 NOTE — Telephone Encounter (Signed)
[facility] as [facility].

## 2015-12-10 ENCOUNTER — Other Ambulatory Visit (INDEPENDENT_AMBULATORY_CARE_PROVIDER_SITE_OTHER): Payer: Medicare Other

## 2015-12-10 DIAGNOSIS — Z7901 Long term (current) use of anticoagulants: Secondary | ICD-10-CM

## 2015-12-10 LAB — PROTIME-INR
INR: 3.3 ratio — AB (ref 0.8–1.0)
Prothrombin Time: 35.3 s — ABNORMAL HIGH (ref 9.6–13.1)

## 2015-12-11 ENCOUNTER — Telehealth: Payer: Self-pay | Admitting: *Deleted

## 2015-12-11 NOTE — Telephone Encounter (Signed)
FYI   Pt was notified of pt.inr results and dosage changes, made lab appointment Jan 3rd 2:15pm

## 2015-12-24 ENCOUNTER — Other Ambulatory Visit (INDEPENDENT_AMBULATORY_CARE_PROVIDER_SITE_OTHER): Payer: Medicare Other

## 2015-12-24 DIAGNOSIS — Z7901 Long term (current) use of anticoagulants: Secondary | ICD-10-CM

## 2015-12-24 LAB — PROTIME-INR
INR: 1.2 ratio — ABNORMAL HIGH (ref 0.8–1.0)
Prothrombin Time: 13 s (ref 9.6–13.1)

## 2015-12-26 ENCOUNTER — Other Ambulatory Visit: Payer: Self-pay | Admitting: *Deleted

## 2015-12-26 DIAGNOSIS — M1990 Unspecified osteoarthritis, unspecified site: Secondary | ICD-10-CM

## 2015-12-26 MED ORDER — WARFARIN SODIUM 2 MG PO TABS: 2.0000 mg | ORAL_TABLET | Freq: Every day | ORAL | Status: DC

## 2016-01-07 ENCOUNTER — Other Ambulatory Visit: Payer: Self-pay | Admitting: Internal Medicine

## 2016-01-07 ENCOUNTER — Other Ambulatory Visit: Payer: Self-pay | Admitting: Cardiovascular Disease

## 2016-01-28 ENCOUNTER — Other Ambulatory Visit (INDEPENDENT_AMBULATORY_CARE_PROVIDER_SITE_OTHER): Payer: Medicare Other

## 2016-01-28 DIAGNOSIS — Z7901 Long term (current) use of anticoagulants: Secondary | ICD-10-CM | POA: Diagnosis not present

## 2016-01-28 LAB — PROTIME-INR
INR: 1.6 ratio — ABNORMAL HIGH (ref 0.8–1.0)
PROTHROMBIN TIME: 17.4 s — AB (ref 9.6–13.1)

## 2016-01-29 ENCOUNTER — Other Ambulatory Visit: Payer: Self-pay | Admitting: *Deleted

## 2016-01-29 MED ORDER — WARFARIN SODIUM 3 MG PO TABS: 3.0000 mg | ORAL_TABLET | Freq: Every day | ORAL | Status: DC

## 2016-01-29 NOTE — Progress Notes (Unsigned)
Coumadin script sent to [facility].

## 2016-01-30 ENCOUNTER — Other Ambulatory Visit: Payer: Self-pay | Admitting: Internal Medicine

## 2016-02-03 ENCOUNTER — Other Ambulatory Visit: Payer: Self-pay | Admitting: Internal Medicine

## 2016-02-13 ENCOUNTER — Other Ambulatory Visit (INDEPENDENT_AMBULATORY_CARE_PROVIDER_SITE_OTHER): Payer: Medicare Other

## 2016-02-13 DIAGNOSIS — Z7901 Long term (current) use of anticoagulants: Secondary | ICD-10-CM | POA: Diagnosis not present

## 2016-02-13 LAB — PROTIME-INR
INR: 2.2 ratio — ABNORMAL HIGH (ref 0.8–1.0)
Prothrombin Time: 23.6 s — ABNORMAL HIGH (ref 9.6–13.1)

## 2016-02-14 ENCOUNTER — Ambulatory Visit: Payer: Medicare Other | Admitting: Cardiovascular Disease

## 2016-03-11 ENCOUNTER — Other Ambulatory Visit: Payer: Self-pay | Admitting: Internal Medicine

## 2016-03-11 NOTE — Telephone Encounter (Signed)
No office visit since 8/15 please advise as to refill.

## 2016-03-11 NOTE — Telephone Encounter (Signed)
RefillED for 30 days only.  OFFICE VISIT NEEDED prior to any more refills 

## 2016-03-12 ENCOUNTER — Encounter: Payer: Self-pay | Admitting: Cardiovascular Disease

## 2016-03-12 ENCOUNTER — Ambulatory Visit (INDEPENDENT_AMBULATORY_CARE_PROVIDER_SITE_OTHER): Payer: Medicare Other | Admitting: Cardiovascular Disease

## 2016-03-12 ENCOUNTER — Other Ambulatory Visit: Payer: Self-pay

## 2016-03-12 ENCOUNTER — Other Ambulatory Visit: Payer: Medicare Other

## 2016-03-12 VITALS — BP 156/60 | HR 74 | Ht 66.0 in | Wt 120.5 lb

## 2016-03-12 DIAGNOSIS — I10 Essential (primary) hypertension: Secondary | ICD-10-CM

## 2016-03-12 DIAGNOSIS — I4891 Unspecified atrial fibrillation: Secondary | ICD-10-CM

## 2016-03-12 DIAGNOSIS — R001 Bradycardia, unspecified: Secondary | ICD-10-CM

## 2016-03-12 MED ORDER — APIXABAN 2.5 MG PO TABS: 2.5000 mg | ORAL_TABLET | Freq: Two times a day (BID) | ORAL | Status: DC

## 2016-03-12 NOTE — Patient Instructions (Signed)
Medication Instructions:  Your physician has recommended you make the following change in your medication:  STOP taking coumadin START taking Eliquis 2.5mg  twice daily. First dose March 24th   Labwork: BMET and CBC in two weeks  Testing/Procedures: none  Follow-Up: Your physician wants you to follow-up in: six months with Dr. [name].  You will receive a reminder letter in the mail two months in advance. If you don't receive a letter, please call our office to schedule the follow-up appointment.   Any Other Special Instructions Will Be Listed Below (If Applicable).     If you need a refill on your cardiac medications before your next appointment, please call your pharmacy.

## 2016-03-12 NOTE — Progress Notes (Signed)
Cardiology Office Note   Date:  03/12/2016   ID:  JILLANN CHARETTE, DOB [birth year], MRN [id number]  PCP:  Sherlene Shams, MD  Cardiologist:   Lorine Bears, MD   Chief Complaint  Patient presents with  . Other    6 month f/u no complaints. Meds reviewed verbally with pt.      History of Present Illness: NIOMI VALENT is a 82 y.o. female who presents for A follow-up visit regarding chronic atrial fibrillation on warfarin. Other medical problems include hyperlipidemia , history of DVT, hypertension, bilateral mastectomy and previous stroke in 2013 while she was temporarily off anticoagulation. She did have a non-ST elevation MI with peak troponin 2.5 felt secondary to demand ischemia. No cardiac workup was performed apart from echocardiogram. Echocardiogram showed normal LV systolic function. She has been doing reasonably well and denies any chest pain or shortness of breath. No palpitations, dizziness or syncope. Her biggest problem seems to be inability to maintain therapeutic INR with warfarin. She has been significantly stressed about this.  Past Medical History  Diagnosis Date  . Atrial fibrillation (HCC)   . Polyp, stomach   . Colon polyp 2008  . Sensorineural hearing loss 2006    sudden, left ear  . DVT (deep venous thrombosis) [facility]) May 2007  . History of DVT of lower extremity May 2007  . Hyperlipidemia   . Hypertension   . Stroke (HCC)   . Coronary artery disease     Past Surgical History  Procedure Laterality Date  . Laser surgery for varicose veins    . Nasal polyp surgery  2008    Dr. Jenne Campus, has been horase ever since  . Breast surgery  1972    mastectomies, presumed due to breast CA  . Abdominal hysterectomy  1965  . Appendectomy  1950  . Tonsillectomy  1943  . Laminectomy  1975  . Thyroid surgery  1977    nodule removed     Current Outpatient Prescriptions  Medication Sig Dispense Refill  . acetaminophen (TYLENOL) 500 MG tablet Take 500 mg  by mouth every 6 (six) hours as needed for pain.    . calcium carbonate (OS-CAL) 600 MG TABS Take 600 mg by mouth daily.      . Calcium Carbonate-Simethicone (MAALOX ADVANCED MAX ST) 1000-60 MG CHEW Chew 1 tablet by mouth as needed.      . carboxymethylcellulose (REFRESH PLUS) 0.5 % SOLN 1 drop 3 (three) times daily as needed.    . indapamide (LOZOL) 2.5 MG tablet TAKE 1 TABLET(2.5 MG) BY MOUTH EVERY MORNING 30 tablet 0  . ipratropium (ATROVENT) 0.06 % nasal spray Place 2 sprays into the nose daily.    . isosorbide mononitrate (IMDUR) 60 MG 24 hr tablet TAKE 1 TABLET BY MOUTH ONCE DAILY 30 tablet 6  . lisinopril (PRINIVIL,ZESTRIL) 20 MG tablet TAKE 1 TABLET(20 MG) BY MOUTH DAILY 90 tablet 1  . nitroGLYCERIN (NITROSTAT) 0.4 MG SL tablet Place 1 tablet (0.4 mg total) under the tongue every 5 (five) minutes as needed. 10 tablet 0  . potassium chloride (K-DUR) 10 MEQ tablet TAKE 1 TABLET BY MOUTH TWICE DAILY 60 tablet 5  . Tdap (BOOSTRIX) 5-2.5-18.5 LF-MCG/0.5 injection Inject 0.5 mLs into the muscle once. 0.5 mL 0  . warfarin (COUMADIN) 2 MG tablet Take 1 tablet (2 mg total) by mouth daily. 30 tablet 3  . warfarin (COUMADIN) 3 MG tablet Take 1 tablet (3 mg total) by mouth daily. 30 tablet  3  . zoster vaccine live, PF, (ZOSTAVAX) [id number] UNT/0.65ML injection Inject 19,400 Units into the skin once. 1 each 0   No current facility-administered medications for this visit.    Allergies:   Amoxicillin; Amoxicillin-pot clavulanate; Cephalexin; Cephalexin; Clarithromycin; Clindamycin/lincomycin; Lansoprazole; Lisinopril; Macrodantin; Sulfa antibiotics; and Tussionex pennkinetic er    Social History:  The patient  reports that she has never smoked. She has never used smokeless tobacco. She reports that she does not drink alcohol or use illicit drugs.   Family History:  The patient's family history includes Cancer in her other; Heart failure in her brother and mother.    ROS:  Please see the history of  present illness.   Otherwise, review of systems are positive for none.   All other systems are reviewed and negative.    PHYSICAL EXAM: VS:  BP 156/60 mmHg  Pulse 74  Ht 5\' 6"  (1.676 m)  Wt 120 lb 8 oz (54.658 kg)  BMI 19.46 kg/m2 , BMI Body mass index is 19.46 kg/(m^2). GEN: Well nourished, well developed, in no acute distress HEENT: normal Neck: no JVD, carotid bruits, or masses Cardiac: Irregularly irregular; no murmurs, rubs, or gallops,no edema  Respiratory:  clear to auscultation bilaterally, normal work of breathing GI: soft, nontender, nondistended, + BS MS: no deformity or atrophy Skin: warm and dry, no rash Neuro:  Strength and sensation are intact Psych: euthymic mood, full affect   EKG:  EKG is ordered today. The ekg ordered today demonstrates atrial fibrillation with possible old anteroseptal infarct.   Recent Labs: 09/03/2015: ALT 10; BUN 15; Creatinine, Ser 1.22*; Hemoglobin 12.0; Platelets 197.0; Potassium 4.0; Sodium 140    Lipid Panel    Component Value Date/Time   CHOL 197 10/23/2012 0400   TRIG 137 10/23/2012 0400   HDL 61* 10/23/2012 0400   VLDL 27 10/23/2012 0400   LDLCALC 109* 10/23/2012 0400      Wt Readings from Last 3 Encounters:  03/12/16 120 lb 8 oz (54.658 kg)  07/15/15 127 lb 4 oz (57.72 kg)  12/31/14 126 lb 12 oz (57.493 kg)         ASSESSMENT AND PLAN:  1.  Chronic atrial fibrillation: Ventricular rate is well controlled without any medications. Begin issue seems to be the difficulty with warfarin anticoagulation. I discussed with her other options for anticoagulation. After extensive discussion, I elected to switch her to Eliquis 2.5 mg twice daily (reduced dose due to age and weight). Check routine labs in 2 weeks.  2. Essential hypertension: Blood pressure is mildly elevated. Continue to monitor.    Disposition:   FU with me in 6 months  Signed,  Lorine BearsMuhammad Kaysa Roulhac, MD  03/12/2016 2:10 PM    [facility]

## 2016-03-13 ENCOUNTER — Other Ambulatory Visit: Payer: Medicare Other

## 2016-03-13 ENCOUNTER — Telehealth: Payer: Self-pay | Admitting: *Deleted

## 2016-03-13 ENCOUNTER — Ambulatory Visit: Payer: Medicare Other | Admitting: Cardiovascular Disease

## 2016-03-13 NOTE — Telephone Encounter (Signed)
Pt required PA for Eliquis 2.5 mg tablet. Pt has been approved for Eliquis until 12/20/16.

## 2016-03-18 ENCOUNTER — Other Ambulatory Visit: Payer: Self-pay

## 2016-03-18 DIAGNOSIS — I4891 Unspecified atrial fibrillation: Secondary | ICD-10-CM

## 2016-04-03 ENCOUNTER — Other Ambulatory Visit
Admission: RE | Admit: 2016-04-03 | Discharge: 2016-04-03 | Disposition: A | Discharge status: Home / Self Care | Payer: Medicare Other | Source: Ambulatory Visit | Attending: Cardiovascular Disease | Admitting: Cardiovascular Disease

## 2016-04-03 DIAGNOSIS — I4891 Unspecified atrial fibrillation: Secondary | ICD-10-CM | POA: Diagnosis present

## 2016-04-03 LAB — CBC WITH DIFFERENTIAL/PLATELET
Basophils Absolute: 0.1 10*3/uL (ref 0–0.1)
Basophils Relative: 1 %
EOS PCT: 1 %
Eosinophils Absolute: 0.1 10*3/uL (ref 0–0.7)
HCT: 34.8 % — ABNORMAL LOW (ref 35.0–47.0)
Hemoglobin: 11.9 g/dL — ABNORMAL LOW (ref 12.0–16.0)
LYMPHS ABS: 1.9 10*3/uL (ref 1.0–3.6)
LYMPHS PCT: 38 %
MCH: 32.1 pg (ref 26.0–34.0)
MCHC: 34.3 g/dL (ref 32.0–36.0)
MCV: 93.6 fL (ref 80.0–100.0)
MONO ABS: 0.3 10*3/uL (ref 0.2–0.9)
Monocytes Relative: 7 %
Neutro Abs: 2.6 10*3/uL (ref 1.4–6.5)
Neutrophils Relative %: 53 %
PLATELETS: 151 10*3/uL (ref 150–440)
RBC: 3.72 MIL/uL — AB (ref 3.80–5.20)
RDW: 13.4 % (ref 11.5–14.5)
WBC: 5 10*3/uL (ref 3.6–11.0)

## 2016-04-03 LAB — BASIC METABOLIC PANEL
Anion gap: 7 (ref 5–15)
BUN: 22 mg/dL — ABNORMAL HIGH (ref 6–20)
CHLORIDE: 104 mmol/L (ref 101–111)
CO2: 26 mmol/L (ref 22–32)
CREATININE: 1.2 mg/dL — AB (ref 0.44–1.00)
Calcium: 9.1 mg/dL (ref 8.9–10.3)
GFR calc non Af Amer: 38 mL/min — ABNORMAL LOW (ref >60)
GFR, EST AFRICAN AMERICAN: 44 mL/min — AB (ref >60)
Glucose, Bld: 93 mg/dL (ref 65–99)
POTASSIUM: 3.1 mmol/L — AB (ref 3.5–5.1)
SODIUM: 137 mmol/L (ref 135–145)

## 2016-04-07 ENCOUNTER — Telehealth: Payer: Self-pay | Admitting: Cardiovascular Disease

## 2016-04-07 NOTE — Telephone Encounter (Signed)
Stable labs except low K. Increase potassim to 20 meq bid for 3 days then back to 10 meq bid.        Reviewed labs and recommendations w/pt who verbalized understanding. Pt states she wrote med change down and repeated back to me to take 2 potassium tablets twice a day for 3 days (Wed, Thurs, Friday)  then back to her normal dosage. Pt verbalized understanding.

## 2016-04-09 ENCOUNTER — Other Ambulatory Visit: Payer: Self-pay | Admitting: Internal Medicine

## 2016-05-30 ENCOUNTER — Telehealth: Payer: Self-pay

## 2016-05-30 NOTE — Telephone Encounter (Signed)
Patient is on the list for [employer] 2017 and may be a good candidate for an AWV in 2017. Please let me know if/when appt is scheduled.   PCP has not seen pt since 2015. May need to be scheduled with PCP.

## 2016-06-01 NOTE — Telephone Encounter (Signed)
Noted. Will follow as appropriate.  

## 2016-06-10 ENCOUNTER — Other Ambulatory Visit: Payer: Self-pay | Admitting: Internal Medicine

## 2016-06-10 NOTE — Telephone Encounter (Signed)
Last OV 8/15 patient has cancelled several appointments with MD last labs hospital encounter in 4/17.

## 2016-06-10 NOTE — Telephone Encounter (Signed)
Left message for patient to call office needs appointment for medication refills.

## 2016-06-10 NOTE — Telephone Encounter (Signed)
Refill denied.  Needs to have OV

## 2016-06-16 ENCOUNTER — Other Ambulatory Visit: Payer: Self-pay | Admitting: Internal Medicine

## 2016-06-16 ENCOUNTER — Other Ambulatory Visit: Payer: Self-pay | Admitting: Cardiovascular Disease

## 2016-06-16 NOTE — Telephone Encounter (Signed)
Patient stated she cannot make an appointment that she cannot afford to see multiple physicians, patient stated she is seeing Dr. [name] and will call his  Office for refill on indapamide.

## 2016-06-24 NOTE — Telephone Encounter (Signed)
Patient will call back and make an appointment.

## 2016-07-27 ENCOUNTER — Other Ambulatory Visit: Payer: Self-pay | Admitting: Cardiovascular Disease

## 2016-07-27 ENCOUNTER — Other Ambulatory Visit: Payer: Self-pay | Admitting: Internal Medicine

## 2016-07-27 NOTE — Telephone Encounter (Signed)
Patient has not been seen in an OV since 08/07/14... Okay to refill?

## 2016-07-30 ENCOUNTER — Ambulatory Visit (INDEPENDENT_AMBULATORY_CARE_PROVIDER_SITE_OTHER): Payer: Medicare Other | Admitting: Internal Medicine

## 2016-07-30 VITALS — BP 128/78 | HR 89 | Temp 98.2°F | Resp 16 | Wt 113.4 lb

## 2016-07-30 DIAGNOSIS — Z8673 Personal history of transient ischemic attack (TIA), and cerebral infarction without residual deficits: Secondary | ICD-10-CM

## 2016-07-30 DIAGNOSIS — F0150 Vascular dementia without behavioral disturbance: Secondary | ICD-10-CM

## 2016-07-30 DIAGNOSIS — Z79899 Other long term (current) drug therapy: Secondary | ICD-10-CM | POA: Diagnosis not present

## 2016-07-30 DIAGNOSIS — D519 Vitamin B12 deficiency anemia, unspecified: Secondary | ICD-10-CM

## 2016-07-30 DIAGNOSIS — R4189 Other symptoms and signs involving cognitive functions and awareness: Secondary | ICD-10-CM | POA: Diagnosis not present

## 2016-07-30 DIAGNOSIS — Z7901 Long term (current) use of anticoagulants: Secondary | ICD-10-CM

## 2016-07-30 DIAGNOSIS — I10 Essential (primary) hypertension: Secondary | ICD-10-CM

## 2016-07-30 LAB — COMPREHENSIVE METABOLIC PANEL
ALBUMIN: 4.2 g/dL (ref 3.5–5.2)
ALK PHOS: 42 U/L (ref 39–117)
ALT: 18 U/L (ref 0–35)
AST: 30 U/L (ref 0–37)
BILIRUBIN TOTAL: 0.9 mg/dL (ref 0.2–1.2)
BUN: 20 mg/dL (ref 6–23)
CO2: 29 mEq/L (ref 19–32)
Calcium: 10.2 mg/dL (ref 8.4–10.5)
Chloride: 103 mEq/L (ref 96–112)
Creatinine, Ser: 1.26 mg/dL — ABNORMAL HIGH (ref 0.40–1.20)
GFR: 42 mL/min — ABNORMAL LOW (ref >60.00)
GLUCOSE: 89 mg/dL (ref 70–99)
POTASSIUM: 3.7 meq/L (ref 3.5–5.1)
SODIUM: 140 meq/L (ref 135–145)
Total Protein: 7 g/dL (ref 6.0–8.3)

## 2016-07-30 LAB — CBC WITH DIFFERENTIAL/PLATELET
BASOS PCT: 0.8 % (ref 0.0–3.0)
Basophils Absolute: 0 10*3/uL (ref 0.0–0.1)
EOS PCT: 0.5 % (ref 0.0–5.0)
Eosinophils Absolute: 0 10*3/uL (ref 0.0–0.7)
HEMATOCRIT: 34.1 % — AB (ref 36.0–46.0)
HEMOGLOBIN: 11.4 g/dL — AB (ref 12.0–15.0)
LYMPHS PCT: 30.6 % (ref 12.0–46.0)
Lymphs Abs: 1.9 10*3/uL (ref 0.7–4.0)
MCHC: 33.3 g/dL (ref 30.0–36.0)
MCV: 94.6 fl (ref 78.0–100.0)
MONO ABS: 0.4 10*3/uL (ref 0.1–1.0)
Monocytes Relative: 5.9 % (ref 3.0–12.0)
Neutro Abs: 3.8 10*3/uL (ref 1.4–7.7)
Neutrophils Relative %: 62.2 % (ref 43.0–77.0)
Platelets: 182 10*3/uL (ref 150.0–400.0)
RBC: 3.61 Mil/uL — AB (ref 3.87–5.11)
RDW: 13.4 % (ref 11.5–15.5)
WBC: 6.1 10*3/uL (ref 4.0–10.5)

## 2016-07-30 LAB — TSH: TSH: 1 u[IU]/mL (ref 0.35–4.50)

## 2016-07-30 LAB — MAGNESIUM: MAGNESIUM: 1.6 mg/dL (ref 1.5–2.5)

## 2016-07-30 NOTE — Progress Notes (Signed)
Subjective:  Patient ID: Latoya Merritt, female    DOB: 03/20/1920  Age: 82 y.o. MRN: [id number]  CC: The primary encounter diagnosis was Long-term use of high-risk medication. Diagnoses of Cognitive decline, History of CVA (cerebrovascular accident), Essential hypertension, Long term current use of anticoagulant therapy, Vascular dementia without behavioral disturbance, and B12 deficiency anemia were also pertinent to this visit.  HPI Latoya Merritt presents for follow up.   Patient has not been seen in 2 years and med refills were refused recently until she was seen.  She feels generally well.  Continues to live independently in a one level home. Has lost 14 lbs over the last year.   Has a good appetite. States that she eats 3 meals and 2 snacks daily .  Evening dinner is early .  Lives in a one story home .  Drives herself around town but  not after dark and does not drive on the interstate.  Had license renewed. Prepares her own meals.    Caregiver  Is available for shopping and appointments,  Another caregiver to aid in shopping and.  Does her own housecleaning,  Handicapped shower.  wears her Life Alert  And an Alarm system for the house.    .[facility] and [facility].  Both were reviewed today . Potassium was low at 3.1  On 10 meq of potassium and she was given an increased dose of Potassium suppelement  For several days.   Hgb was near normal at 11.9.     She has had more loss of memeory since her last visit.  She insists that her lrft brain stroke that occurred in 2015 occurred in Jan 2017 (no record of ER or [facility] during Jan 2017).  Due to persistent difficulty managing coumadin dose her medication was changed to Eliquis in March by her cardiologist, Dr [name].    Outpatient Medications Prior to Visit  Medication Sig Dispense Refill  . acetaminophen (TYLENOL) 500 MG tablet Take 500 mg by mouth every 6 (six) hours as needed for pain.    Marland Kitchen. apixaban  (ELIQUIS) 2.5 MG TABS tablet Take 1 tablet (2.5 mg total) by mouth 2 (two) times daily. 60 tablet 6  . calcium carbonate (OS-CAL) 600 MG TABS Take 600 mg by mouth daily.      . Calcium Carbonate-Simethicone (MAALOX ADVANCED MAX ST) 1000-60 MG CHEW Chew 1 tablet by mouth as needed.      . carboxymethylcellulose (REFRESH PLUS) 0.5 % SOLN 1 drop 3 (three) times daily as needed.    . indapamide (LOZOL) 2.5 MG tablet TAKE 1 TABLET BY MOUTH EVERY MORNING 30 tablet 3  . ipratropium (ATROVENT) 0.06 % nasal spray Place 2 sprays into the nose daily.    . isosorbide mononitrate (IMDUR) 60 MG 24 hr tablet TAKE 1 TABLET BY MOUTH EVERY DAY 30 tablet 3  . nitroGLYCERIN (NITROSTAT) 0.4 MG SL tablet Place 1 tablet (0.4 mg total) under the tongue every 5 (five) minutes as needed. 10 tablet 0  . lisinopril (PRINIVIL,ZESTRIL) 20 MG tablet TAKE 1 TABLET(20 MG) BY MOUTH DAILY 90 tablet 1  . potassium chloride (K-DUR) 10 MEQ tablet TAKE 1 TABLET BY MOUTH TWICE DAILY 60 tablet 5  . Tdap (BOOSTRIX) 5-2.5-18.5 LF-MCG/0.5 injection Inject 0.5 mLs into the muscle once. 0.5 mL 0  . zoster vaccine live, PF, (ZOSTAVAX) [id number] UNT/0.65ML injection Inject 19,400 Units into the skin once. 1 each 0   No facility-administered  medications prior to visit.     Review of Systems;  Patient denies headache, fevers, malaise, unintentional weight loss, skin rash, eye pain, sinus congestion and sinus pain, sore throat, dysphagia,  hemoptysis , cough, dyspnea, wheezing, chest pain, palpitations, orthopnea, edema, abdominal pain, nausea, melena, diarrhea, constipation, flank pain, dysuria, hematuria, urinary  Frequency, nocturia, numbness, tingling, seizures,  Focal weakness, Loss of consciousness,  Tremor, insomnia, depression, anxiety, and suicidal ideation.      Objective:  BP 128/78   Pulse 89   Temp 98.2 F (36.8 C)   Resp 16   Wt 113 lb 6 oz (51.4 kg)   SpO2 98%   BMI 18.30 kg/m   BP Readings from Last 3 Encounters:    07/30/16 128/78  03/12/16 (!) 156/60  07/15/15 (!) 158/78    Wt Readings from Last 3 Encounters:  07/30/16 113 lb 6 oz (51.4 kg)  03/12/16 120 lb 8 oz (54.7 kg)  07/15/15 127 lb 4 oz (57.7 kg)    General appearance: alert, cooperative and appears stated age Ears: normal TM's and external ear canals both ears Throat: lips, mucosa, and tongue normal; teeth and gums normal Neck: no adenopathy, no carotid bruit, supple, symmetrical, trachea midline and thyroid not enlarged, symmetric, no tenderness/mass/nodules Back: symmetric, no curvature. ROM normal. No CVA tenderness. Lungs: clear to auscultation bilaterally Heart: regular rate and rhythm, S1, S2 normal, no murmur, click, rub or gallop Abdomen: soft, non-tender; bowel sounds normal; no masses,  no organomegaly Pulses: 2+ and symmetric Skin: Skin color, texture, turgor normal. No rashes or lesions Lymph nodes: Cervical, supraclavicular, and axillary nodes normal.  No results found for: HGBA1C  Lab Results  Component Value Date   CREATININE 1.26 (H) 07/30/2016   CREATININE 1.20 (H) 04/03/2016   CREATININE 1.22 (H) 09/03/2015    Lab Results  Component Value Date   WBC 6.1 07/30/2016   HGB 11.4 (L) 07/30/2016   HCT 34.1 (L) 07/30/2016   PLT 182.0 07/30/2016   GLUCOSE 89 07/30/2016   CHOL 197 10/23/2012   TRIG 137 10/23/2012   HDL 61 (H) 10/23/2012   LDLCALC 109 (H) 10/23/2012   ALT 18 07/30/2016   AST 30 07/30/2016   NA 140 07/30/2016   K 3.7 07/30/2016   CL 103 07/30/2016   CREATININE 1.26 (H) 07/30/2016   BUN 20 07/30/2016   CO2 29 07/30/2016   TSH 1.00 07/30/2016   INR 2.2 (H) 02/13/2016    No results found.  Assessment & Plan:   Problem List Items Addressed This Visit    Hypertension    Managed with Imdur and lisinopril.  No changes today.  Renal function stable.  Lab Results  Component Value Date   CREATININE 1.26 (H) 07/30/2016   Lab Results  Component Value Date   NA 140 07/30/2016   K 3.7  07/30/2016   CL 103 07/30/2016   CO2 29 07/30/2016         Vascular dementia without behavioral disturbance    She continues to function and perform all ADLs independently.  Reversible causes of dementia may be contributing with low B12. .  Lab Results  Component Value Date   VITAMINB12 196 (L) 07/30/2016   Lab Results  Component Value Date   TSH 1.00 07/30/2016         History of CVA (cerebrovascular accident)    Left brain, embolic,  With residual right sided deficits that have nearly resolved.        Long term  current use of anticoagulant therapy    No longer taking coumadin due to recurrently high INRs.  Now on Eliquis. Cbc normal except for mild anemia.   Lab Results  Component Value Date   WBC 6.1 07/30/2016   HGB 11.4 (L) 07/30/2016   HCT 34.1 (L) 07/30/2016   MCV 94.6 07/30/2016   PLT 182.0 07/30/2016         B12 deficiency anemia    Will start B12 weekly injections x 3, them monthly. X 3,  Then resume oral supplements       Other Visit Diagnoses    Long-term use of high-risk medication    -  Primary   Relevant Orders   CBC with Differential/Platelet (Completed)   Magnesium (Completed)   Comprehensive metabolic panel (Completed)   Cognitive decline       Relevant Orders   TSH (Completed)   Vitamin B12 (Completed)      I have discontinued Ms. Delmonico Tdap and zoster vaccine live (PF). I am also having her maintain her calcium carbonate, Calcium Carbonate-Simethicone, ipratropium, carboxymethylcellulose, acetaminophen, nitroGLYCERIN, apixaban, indapamide, and isosorbide mononitrate.  No orders of the defined types were placed in this encounter.   Medications Discontinued During This Encounter  Medication Reason  . Tdap (BOOSTRIX) 5-2.5-18.5 LF-MCG/0.5 injection Error  . zoster vaccine live, PF, (ZOSTAVAX) [id number] UNT/0.65ML injection Error    Follow-up: No Follow-up on file.   Sherlene Shams, MD

## 2016-07-30 NOTE — Patient Instructions (Signed)
You are doing well!!  I will see you in a year, sooner if needed   I am checking your potassium ,  Magnesium and thyroid today

## 2016-07-31 ENCOUNTER — Other Ambulatory Visit: Payer: Self-pay | Admitting: Internal Medicine

## 2016-07-31 LAB — VITAMIN B12: Vitamin B-12: 196 pg/mL — ABNORMAL LOW (ref 211–911)

## 2016-08-01 DIAGNOSIS — D519 Vitamin B12 deficiency anemia, unspecified: Secondary | ICD-10-CM | POA: Insufficient documentation

## 2016-08-01 NOTE — Assessment & Plan Note (Signed)
Managed with Imdur and lisinopril.  No changes today.  Renal function stable.  Lab Results  Component Value Date   CREATININE 1.26 (H) 07/30/2016   Lab Results  Component Value Date   NA 140 07/30/2016   K 3.7 07/30/2016   CL 103 07/30/2016   CO2 29 07/30/2016

## 2016-08-01 NOTE — Assessment & Plan Note (Signed)
Left brain, embolic,  With residual right sided deficits that have nearly resolved.

## 2016-08-01 NOTE — Assessment & Plan Note (Signed)
Will start B12 weekly injections x 3, them monthly. X 3,  Then resume oral supplements

## 2016-08-01 NOTE — Assessment & Plan Note (Signed)
No longer taking coumadin due to recurrently high INRs.  Now on Eliquis. Cbc normal except for mild anemia.   Lab Results  Component Value Date   WBC 6.1 07/30/2016   HGB 11.4 (L) 07/30/2016   HCT 34.1 (L) 07/30/2016   MCV 94.6 07/30/2016   PLT 182.0 07/30/2016

## 2016-08-01 NOTE — Assessment & Plan Note (Addendum)
She continues to function and perform all ADLs independently.  Reversible causes of dementia may be contributing with low B12. .  Lab Results  Component Value Date   VITAMINB12 196 (L) 07/30/2016   Lab Results  Component Value Date   TSH 1.00 07/30/2016

## 2016-08-03 NOTE — Progress Notes (Signed)
Ok. Pt is scheduled for 08/17 @ 2pm.

## 2016-08-06 ENCOUNTER — Ambulatory Visit (INDEPENDENT_AMBULATORY_CARE_PROVIDER_SITE_OTHER): Payer: Medicare Other

## 2016-08-06 DIAGNOSIS — E538 Deficiency of other specified B group vitamins: Secondary | ICD-10-CM

## 2016-08-06 MED ORDER — CYANOCOBALAMIN 1000 MCG/ML IJ SOLN
1000.0000 ug | Freq: Once | INTRAMUSCULAR | Status: AC
  Administered 2016-08-06: 1000 ug via INTRAMUSCULAR

## 2016-08-06 NOTE — Progress Notes (Signed)
Care was provided under my supervision. I agree with the management as indicated in the note.  Karsyn Rochin DO  

## 2016-08-06 NOTE — Progress Notes (Signed)
Patient came in for a B12 injection.  Received in Right deltoid.  Patient had low B12 on recent labs this is #1 shot of 3 weekly then will have monthly.  Patient tolerated well. thanks

## 2016-08-13 ENCOUNTER — Ambulatory Visit (INDEPENDENT_AMBULATORY_CARE_PROVIDER_SITE_OTHER): Payer: Medicare Other

## 2016-08-13 DIAGNOSIS — D519 Vitamin B12 deficiency anemia, unspecified: Secondary | ICD-10-CM

## 2016-08-13 MED ORDER — CYANOCOBALAMIN 1000 MCG/ML IJ SOLN
1000.0000 ug | Freq: Once | INTRAMUSCULAR | Status: AC
  Administered 2016-08-13: 1000 ug via INTRAMUSCULAR

## 2016-08-13 NOTE — Progress Notes (Signed)
Patient received B12 injection  

## 2016-08-20 ENCOUNTER — Ambulatory Visit (INDEPENDENT_AMBULATORY_CARE_PROVIDER_SITE_OTHER): Payer: Medicare Other

## 2016-08-20 DIAGNOSIS — E538 Deficiency of other specified B group vitamins: Secondary | ICD-10-CM | POA: Diagnosis not present

## 2016-08-20 MED ORDER — CYANOCOBALAMIN 1000 MCG/ML IJ SOLN
1000.0000 ug | Freq: Once | INTRAMUSCULAR | Status: AC
  Administered 2016-08-20: 1000 ug via INTRAMUSCULAR

## 2016-08-20 NOTE — Progress Notes (Signed)
Patient came in for a b12 injection.  Received in Left deltoid.  Patient tolerated well.  

## 2016-08-20 NOTE — Progress Notes (Signed)
Care was provided under my supervision. I agree with the management as indicated in the note.  Zhi Geier DO  

## 2016-09-08 ENCOUNTER — Ambulatory Visit (INDEPENDENT_AMBULATORY_CARE_PROVIDER_SITE_OTHER): Payer: Medicare Other | Admitting: Cardiovascular Disease

## 2016-09-08 ENCOUNTER — Encounter: Payer: Self-pay | Admitting: Cardiovascular Disease

## 2016-09-08 VITALS — BP 140/66 | HR 68 | Ht 66.0 in | Wt 112.4 lb

## 2016-09-08 DIAGNOSIS — I2510 Atherosclerotic heart disease of native coronary artery without angina pectoris: Secondary | ICD-10-CM | POA: Diagnosis not present

## 2016-09-08 DIAGNOSIS — I4891 Unspecified atrial fibrillation: Secondary | ICD-10-CM | POA: Diagnosis not present

## 2016-09-08 NOTE — Progress Notes (Signed)
Cardiology Office Note   Date:  09/08/2016   ID:  Latoya Merritt, DOB 05/05/1920, MRN [id number]  PCP:  Sherlene ShamsULLO, TERESA L, MD  Cardiologist:   Lorine BearsMuhammad Arida, MD   Chief Complaint  Patient presents with  . Atrial Fibrillation  . Coronary artery disease involving native coronary artery of       History of Present Illness: Latoya Merritt is a 82 y.o. female who presents for A follow-up visit regarding chronic atrial fibrillation . Other medical problems include hyperlipidemia , history of DVT, hypertension, bilateral mastectomy and previous stroke in 2013 while she was temporarily off anticoagulation. She did have a non-ST elevation MI with peak troponin 2.5 felt secondary to demand ischemia. No cardiac workup was performed apart from echocardiogram. Echocardiogram showed normal LV systolic function. During last visit, she was switched from warfarin to Eliquis. She has been doing well and denies any bleeding complications. She reports less bruising. She reports exertional chest pain with over exertion but not with every days activities at the house.  Past Medical History:  Diagnosis Date  . Atrial fibrillation (HCC)   . Colon polyp 2008  . Coronary artery disease   . DVT (deep venous thrombosis) [facility](HCC) May 2007  . History of DVT of lower extremity May 2007  . Hyperlipidemia   . Hypertension   . Polyp, stomach   . Sensorineural hearing loss 2006   sudden, left ear  . Stroke [facility](HCC)     Past Surgical History:  Procedure Laterality Date  . ABDOMINAL HYSTERECTOMY  1965  . APPENDECTOMY  1950  . BREAST SURGERY  1972   mastectomies, presumed due to breast CA  . LAMINECTOMY  1975  . LASER SURGERY FOR VARICOSE VEINS    . NASAL POLYP SURGERY  2008   Dr. Jenne CampusMcQueen, has been horase ever since  . THYROID SURGERY  1977   nodule removed  . TONSILLECTOMY  1943     Current Outpatient Prescriptions  Medication Sig Dispense Refill  . acetaminophen (TYLENOL) 500 MG tablet Take 500 mg  by mouth every 6 (six) hours as needed for pain.    Marland Kitchen. apixaban (ELIQUIS) 2.5 MG TABS tablet Take 1 tablet (2.5 mg total) by mouth 2 (two) times daily. 60 tablet 6  . calcium carbonate (OS-CAL) 600 MG TABS Take 600 mg by mouth daily.      . carboxymethylcellulose (REFRESH PLUS) 0.5 % SOLN 1 drop 3 (three) times daily as needed.    . indapamide (LOZOL) 2.5 MG tablet TAKE 1 TABLET BY MOUTH EVERY MORNING 30 tablet 3  . ipratropium (ATROVENT) 0.06 % nasal spray Place 2 sprays into the nose daily.    . isosorbide mononitrate (IMDUR) 60 MG 24 hr tablet TAKE 1 TABLET BY MOUTH EVERY DAY 30 tablet 3  . lisinopril (PRINIVIL,ZESTRIL) 20 MG tablet TAKE 1 TABLET(20 MG) BY MOUTH DAILY 90 tablet 1  . nitroGLYCERIN (NITROSTAT) 0.4 MG SL tablet Place 1 tablet (0.4 mg total) under the tongue every 5 (five) minutes as needed. 10 tablet 0  . potassium chloride (K-DUR) 10 MEQ tablet TAKE 1 TABLET BY MOUTH TWICE DAILY 60 tablet 5   No current facility-administered medications for this visit.     Allergies:   Amoxicillin; Amoxicillin-pot clavulanate; Cephalexin; Cephalexin; Clarithromycin; Clindamycin/lincomycin; Lansoprazole; Lisinopril; Macrodantin; Sulfa antibiotics; and Tussionex pennkinetic er [hydrocod polst-cpm polst er]    Social History:  The patient  reports that she has never smoked. She has never used smokeless tobacco. She reports  that she does not drink alcohol or use drugs.   Family History:  The patient's family history includes Cancer in her other; Heart failure in her brother and mother.    ROS:  Please see the history of present illness.   Otherwise, review of systems are positive for none.   All other systems are reviewed and negative.    PHYSICAL EXAM: VS:  BP 140/66   Pulse 68   Ht 5\' 6"  (1.676 m)   Wt 112 lb 6.4 oz (51 kg)   SpO2 98%   BMI 18.14 kg/m  , BMI Body mass index is 18.14 kg/m. GEN: Well nourished, well developed, in no acute distress  HEENT: normal  Neck: no JVD, carotid  bruits, or masses Cardiac: Irregularly irregular; no murmurs, rubs, or gallops,no edema  Respiratory:  clear to auscultation bilaterally, normal work of breathing GI: soft, nontender, nondistended, + BS MS: no deformity or atrophy  Skin: warm and dry, no rash Neuro:  Strength and sensation are intact Psych: euthymic mood, full affect   EKG:  EKG is ordered today. The ekg ordered today demonstrates atrial fibrillation with possible old anteroseptal infarct.   Recent Labs: 07/30/2016: ALT 18; BUN 20; Creatinine, Ser 1.26; Hemoglobin 11.4; Magnesium 1.6; Platelets 182.0; Potassium 3.7; Sodium 140; TSH 1.00    Lipid Panel    Component Value Date/Time   CHOL 197 10/23/2012 0400   TRIG 137 10/23/2012 0400   HDL 61 (H) 10/23/2012 0400   VLDL 27 10/23/2012 0400   LDLCALC 109 (H) 10/23/2012 0400      Wt Readings from Last 3 Encounters:  09/08/16 112 lb 6.4 oz (51 kg)  07/30/16 113 lb 6 oz (51.4 kg)  03/12/16 120 lb 8 oz (54.7 kg)         ASSESSMENT AND PLAN:  1.  Chronic atrial fibrillation: Ventricular rate is well controlled without any medications.  He is tolerating anticoagulation with Eliquis. I reviewed her labs in August which showed mild anemia with a hemoglobin of 11.4 which previously was 12. She is getting vitamin B12 shots. Renal function was stable with a creatinine of 1.2.  2. Essential hypertension: Blood pressure is controlled considering her age.  3. Stable class II angina: No recent worsening. Continue medical therapy.    Disposition:   FU with me in 6 months  Signed,  Lorine Bears, MD  09/08/2016 2:39 PM    [facility]

## 2016-09-08 NOTE — Patient Instructions (Signed)
Medication Instructions: Continue same medications.   Labwork: None.   Procedures/Testing: None.   Follow-Up: 6 months with Dr. Arida.   Any Additional Special Instructions Will Be Listed Below (If Applicable).     If you need a refill on your cardiac medications before your next appointment, please call your pharmacy.   

## 2016-09-16 ENCOUNTER — Ambulatory Visit: Payer: Medicare Other

## 2016-09-22 ENCOUNTER — Ambulatory Visit (INDEPENDENT_AMBULATORY_CARE_PROVIDER_SITE_OTHER): Payer: Medicare Other

## 2016-09-22 DIAGNOSIS — Z23 Encounter for immunization: Secondary | ICD-10-CM | POA: Diagnosis not present

## 2016-09-22 DIAGNOSIS — E538 Deficiency of other specified B group vitamins: Secondary | ICD-10-CM

## 2016-09-22 MED ORDER — CYANOCOBALAMIN 1000 MCG/ML IJ SOLN
1000.0000 ug | Freq: Once | INTRAMUSCULAR | Status: AC
  Administered 2016-09-22: 1000 ug via INTRAMUSCULAR

## 2016-09-22 NOTE — Progress Notes (Addendum)
Patient came in for a b12 injection and a flu shot.  Received B12 in left deltoid per patient request and the flu shot in right deltoid.  Patient tolerated well.    I have reviewed the above information and agree with above.   Duncan Dulleresa Tullo, MD

## 2016-10-06 ENCOUNTER — Other Ambulatory Visit: Payer: Self-pay | Admitting: Cardiovascular Disease

## 2016-10-26 ENCOUNTER — Other Ambulatory Visit: Payer: Self-pay | Admitting: Cardiovascular Disease

## 2016-11-19 ENCOUNTER — Other Ambulatory Visit: Payer: Self-pay

## 2016-11-19 MED ORDER — ISOSORBIDE MONONITRATE ER 60 MG PO TB24: 60.0000 mg | ORAL_TABLET | Freq: Every day | ORAL | 3 refills | Status: DC

## 2017-01-22 ENCOUNTER — Other Ambulatory Visit: Payer: Self-pay | Admitting: Internal Medicine

## 2017-01-22 NOTE — Telephone Encounter (Signed)
Pt last refill of lisinopril was on 10/27/16. Last OV and labs were on 07/31/16 with no future further appts. Ok to refill?

## 2017-01-23 NOTE — Telephone Encounter (Signed)
REFILLED,  6 MONTH FOLLOW UP DUE IN February

## 2017-01-24 ENCOUNTER — Other Ambulatory Visit: Payer: Self-pay | Admitting: Internal Medicine

## 2017-01-25 NOTE — Telephone Encounter (Signed)
Called pt, she will call office back to make an appointment. Pt was offered 02/15/17 at 3:30, pt stated she had to check with care giver.

## 2017-01-25 NOTE — Telephone Encounter (Signed)
Pt needs 6 month follow up per Dr. Darrick Huntsmanullo. Can you schedule pt for follow up? Thank you.

## 2017-02-09 ENCOUNTER — Other Ambulatory Visit: Payer: Self-pay | Admitting: Cardiovascular Disease

## 2017-02-23 ENCOUNTER — Ambulatory Visit: Payer: Medicare Other

## 2017-02-26 ENCOUNTER — Other Ambulatory Visit: Payer: Self-pay | Admitting: Internal Medicine

## 2017-03-03 ENCOUNTER — Ambulatory Visit: Payer: Medicare Other

## 2017-03-10 ENCOUNTER — Other Ambulatory Visit: Payer: Self-pay | Admitting: Cardiovascular Disease

## 2017-03-10 NOTE — Telephone Encounter (Signed)
Refill Request, Indapamide 2.5 mg tablet request.

## 2017-03-24 ENCOUNTER — Other Ambulatory Visit: Payer: Self-pay | Admitting: Cardiovascular Disease

## 2017-03-24 ENCOUNTER — Telehealth: Payer: Self-pay

## 2017-03-24 NOTE — Telephone Encounter (Signed)
-----   Message from Margrett Rud, [place] sent at 03/24/2017  8:22 AM EDT ----- Elvina Sidle, can you try to schedule a follow up appointment with Dr. Kirke Corin. Patient was last seen 09/08/16 and per that office note,  follow up in 6 months. Thank you!

## 2017-03-24 NOTE — Telephone Encounter (Signed)
lmom for pt to schedule

## 2017-04-01 ENCOUNTER — Other Ambulatory Visit: Payer: Self-pay | Admitting: Internal Medicine

## 2017-04-07 ENCOUNTER — Ambulatory Visit (INDEPENDENT_AMBULATORY_CARE_PROVIDER_SITE_OTHER): Payer: Medicare Other | Admitting: Internal Medicine

## 2017-04-07 ENCOUNTER — Encounter: Payer: Self-pay | Admitting: Internal Medicine

## 2017-04-07 VITALS — BP 160/82 | HR 76 | Temp 97.9°F | Resp 16 | Ht 66.0 in | Wt 113.6 lb

## 2017-04-07 DIAGNOSIS — Z0000 Encounter for general adult medical examination without abnormal findings: Secondary | ICD-10-CM | POA: Diagnosis not present

## 2017-04-07 DIAGNOSIS — E559 Vitamin D deficiency, unspecified: Secondary | ICD-10-CM

## 2017-04-07 DIAGNOSIS — D513 Other dietary vitamin B12 deficiency anemia: Secondary | ICD-10-CM | POA: Diagnosis not present

## 2017-04-07 DIAGNOSIS — E538 Deficiency of other specified B group vitamins: Secondary | ICD-10-CM | POA: Diagnosis not present

## 2017-04-07 DIAGNOSIS — Z9013 Acquired absence of bilateral breasts and nipples: Secondary | ICD-10-CM | POA: Diagnosis not present

## 2017-04-07 DIAGNOSIS — F0150 Vascular dementia without behavioral disturbance: Secondary | ICD-10-CM

## 2017-04-07 DIAGNOSIS — R5383 Other fatigue: Secondary | ICD-10-CM

## 2017-04-07 DIAGNOSIS — I10 Essential (primary) hypertension: Secondary | ICD-10-CM

## 2017-04-07 DIAGNOSIS — Z7189 Other specified counseling: Secondary | ICD-10-CM | POA: Diagnosis not present

## 2017-04-07 DIAGNOSIS — Z86718 Personal history of other venous thrombosis and embolism: Secondary | ICD-10-CM | POA: Diagnosis not present

## 2017-04-07 LAB — CBC WITH DIFFERENTIAL/PLATELET
Basophils Absolute: 0 10*3/uL (ref 0.0–0.1)
Basophils Relative: 0.5 % (ref 0.0–3.0)
EOS PCT: 0.4 % (ref 0.0–5.0)
Eosinophils Absolute: 0 10*3/uL (ref 0.0–0.7)
HCT: 36 % (ref 36.0–46.0)
Hemoglobin: 11.9 g/dL — ABNORMAL LOW (ref 12.0–15.0)
LYMPHS ABS: 1.8 10*3/uL (ref 0.7–4.0)
Lymphocytes Relative: 29 % (ref 12.0–46.0)
MCHC: 33.2 g/dL (ref 30.0–36.0)
MCV: 94.9 fl (ref 78.0–100.0)
MONO ABS: 0.3 10*3/uL (ref 0.1–1.0)
MONOS PCT: 5.7 % (ref 3.0–12.0)
NEUTROS ABS: 3.9 10*3/uL (ref 1.4–7.7)
NEUTROS PCT: 64.4 % (ref 43.0–77.0)
PLATELETS: 170 10*3/uL (ref 150.0–400.0)
RBC: 3.8 Mil/uL — AB (ref 3.87–5.11)
RDW: 13.2 % (ref 11.5–15.5)
WBC: 6.1 10*3/uL (ref 4.0–10.5)

## 2017-04-07 LAB — COMPREHENSIVE METABOLIC PANEL
ALK PHOS: 44 U/L (ref 39–117)
ALT: 17 U/L (ref 0–35)
AST: 28 U/L (ref 0–37)
Albumin: 4.4 g/dL (ref 3.5–5.2)
BILIRUBIN TOTAL: 0.8 mg/dL (ref 0.2–1.2)
BUN: 22 mg/dL (ref 6–23)
CO2: 29 mEq/L (ref 19–32)
CREATININE: 1.27 mg/dL — AB (ref 0.40–1.20)
Calcium: 10.5 mg/dL (ref 8.4–10.5)
Chloride: 101 mEq/L (ref 96–112)
GFR: 41.56 mL/min — ABNORMAL LOW (ref >60.00)
Glucose, Bld: 99 mg/dL (ref 70–99)
Potassium: 4.2 mEq/L (ref 3.5–5.1)
SODIUM: 139 meq/L (ref 135–145)
TOTAL PROTEIN: 7.5 g/dL (ref 6.0–8.3)

## 2017-04-07 LAB — TSH: TSH: 1.63 u[IU]/mL (ref 0.35–4.50)

## 2017-04-07 LAB — VITAMIN D 25 HYDROXY (VIT D DEFICIENCY, FRACTURES): VITD: 69.73 ng/mL (ref 30.00–100.00)

## 2017-04-07 LAB — VITAMIN B12: Vitamin B-12: 282 pg/mL (ref 211–911)

## 2017-04-07 NOTE — Progress Notes (Signed)
Patient ID: Latoya Merritt, female    DOB: 16-Jan-1920  Age: 83 y.o. MRN: [id number]  The patient is here for annual  wellness examination and management of other chronic and acute problems.   The risk factors are reflected in the social history.  The roster of all physicians providing medical care to patient - is listed in the Snapshot section of the chart.  Activities of daily living:  The patient is not 100% independent in all ADLs: dressing, toileting, feeding as well as independent mobility. She has a paid caregiver who drives her to her appointments and stays with her for several hours per day  Home safety : The patient has smoke detectors in the home. They wear seatbelts.  There are no firearms at home. There is no violence in the home.   There is no risks for hepatitis, STDs or HIV. There is no   history of blood transfusion. They have no travel history to infectious disease endemic areas of the world.  The patient has seen their dentist in the last six month. They have seen their eye doctor in the last year. They admit to slight hearing difficulty with regard to whispered voices and some television programs.  They have deferred audiologic testing in the last year.  They do not  have excessive sun exposure. Discussed the need for sun protection: hats, long sleeves and use of sunscreen if there is significant sun exposure.   Diet: the importance of a healthy diet is discussed. They do have a healthy diet.  The benefits of regular aerobic exercise were discussed. She walks 4 times per week ,  20 minutes.   Depression screen: there are no signs or vegative symptoms of depression- irritability, change in appetite, anhedonia, sadness/tearfullness.  Cognitive assessment: the patient does not manage all their financial and personal affairs and is actively engaged. They could relate day,date,year and events; recalled 2/3 objects at 3 minutes; performed clock-face test normally.  The following  portions of the patient's history were reviewed and updated as appropriate: allergies, current medications, past family history, past medical history,  past surgical history, past social history  and problem list.  Visual acuity was not assessed per patient preference since she has regular follow up with her ophthalmologist. Hearing and body mass index were assessed and reviewed.   During the course of the visit the patient was educated and counseled about appropriate screening and preventive services including : fall prevention , diabetes screening, nutrition counseling, colorectal cancer screening, and recommended immunizations.    CC: The primary encounter diagnosis was Other fatigue. Diagnoses of B12 deficiency, Status post mastectomy, bilateral, and Vitamin D deficiency were also pertinent to this visit.  Still physically active,  Walks 15 minutes daily , does light yard work Occupational hygienist.  Picking up sticks. Denies back pain, chest pain , hip pain  No dizziness or feeling off balance  Has some persistent neck pain  Managed with tylenol   SLEEPING WELL IN BED BY 10 PM.  NO NOCTURNAL VOIDS.   B12 DEFICIENCY.    HAD A RIGHT BRAIN CVA IN December, admitted to [facility]. Left sided weakness   dnr discussion :  "I want to live to be 89"  Patient refused DNR   Has a skin lesion on right inner thigh. Worried about cancer.  SK noted.    Low body Weight discussed.  Diet reviewed   Eats breakfast,  A snack   Big meal at 2 oclock,  Light meal  at 6 am  (salad or sandwhich )  Plus jellow with fruit   [name] has a past medical history of Atrial fibrillation (HCC); Colon polyp (2008); Coronary artery disease; DVT (deep venous thrombosis) [facility]) (May 2007); History of DVT of lower extremity (May 2007); Hyperlipidemia; Hypertension; Polyp, stomach; Sensorineural hearing loss (2006); and Stroke [facility]).   She has a past surgical history that includes LASER SURGERY FOR VARICOSE VEINS; Nasal polyp  surgery (2008); Breast surgery (1972); Abdominal hysterectomy (1965); Appendectomy (1950); Tonsillectomy (1943); Laminectomy (1975); and Thyroid surgery (1977).   Her family history includes Cancer in her other; Heart failure in her brother and mother.She reports that she has never smoked. She has never used smokeless tobacco. She reports that she does not drink alcohol or use drugs.  Outpatient Medications Prior to Visit  Medication Sig Dispense Refill  . acetaminophen (TYLENOL) 500 MG tablet Take 500 mg by mouth every 6 (six) hours as needed for pain.    . calcium carbonate (OS-CAL) 600 MG TABS Take 600 mg by mouth daily.      . carboxymethylcellulose (REFRESH PLUS) 0.5 % SOLN 1 drop 3 (three) times daily as needed.    Marland Kitchen ELIQUIS 2.5 MG TABS tablet TAKE 1 TABLET(2.5 MG) BY MOUTH TWICE DAILY 180 tablet 3  . indapamide (LOZOL) 2.5 MG tablet TAKE 1 TABLET BY MOUTH EVERY MORNING 30 tablet 0  . ipratropium (ATROVENT) 0.06 % nasal spray Place 2 sprays into the nose daily.    . isosorbide mononitrate (IMDUR) 60 MG 24 hr tablet TAKE 1 TABLET(60 MG) BY MOUTH DAILY 30 tablet 0  . lisinopril (PRINIVIL,ZESTRIL) 20 MG tablet TAKE 1 TABLET(20 MG) BY MOUTH DAILY 90 tablet 0  . nitroGLYCERIN (NITROSTAT) 0.4 MG SL tablet Place 1 tablet (0.4 mg total) under the tongue every 5 (five) minutes as needed. 10 tablet 0  . potassium chloride (K-DUR) 10 MEQ tablet TAKE 1 TABLET BY MOUTH TWICE DAILY 60 tablet 0  . warfarin (COUMADIN) 2 MG tablet TAKE 1 TABLET(2 MG) BY MOUTH DAILY (Patient not taking: Reported on 04/07/2017) 30 tablet 0   No facility-administered medications prior to visit.     Review of Systems   Patient denies headache, fevers, malaise, unintentional weight loss, skin rash, eye pain, sinus congestion and sinus pain, sore throat, dysphagia,  hemoptysis , cough, dyspnea, wheezing, chest pain, palpitations, orthopnea, edema, abdominal pain, nausea, melena, diarrhea, constipation, flank pain, dysuria,  hematuria, urinary  Frequency, nocturia, numbness, tingling, seizures,  Focal weakness, Loss of consciousness,  Tremor, insomnia, depression, anxiety, and suicidal ideation.      Objective:  BP (!) 160/82   Pulse 76   Temp 97.9 F (36.6 C) (Oral)   Resp 16   Ht  (1.676 m)   Wt 113 lb 9.6 oz (51.5 kg)   SpO2 99%   BMI 18.34 kg/m   Physical Exam  General appearance: alert, cooperative and appears stated age Ears: normal TM's and external ear canals both ears Throat: lips, mucosa, and tongue normal; teeth and gums normal Neck: no adenopathy, no carotid bruit, supple, symmetrical, trachea midline and thyroid not enlarged, symmetric, no tenderness/mass/nodules Back: symmetric, no curvature. ROM normal. No CVA tenderness. Lungs: clear to auscultation bilaterally Heart: regular rate and rhythm, S1, S2 normal, no murmur, click, rub or gallop Abdomen: soft, non-tender; bowel sounds normal; no masses,  no organomegaly Pulses: 2+ and symmetric Skin: Skin color, texture, turgor normal. No rashes or lesions Lymph nodes: Cervical, supraclavicular, and axillary nodes normal.  Assessment & Plan:   Problem List Items Addressed This Visit    None    Visit Diagnoses    Other fatigue    -  Primary   Relevant Orders   CBC with Differential/Platelet (Completed)   Comprehensive metabolic panel (Completed)   TSH (Completed)   B12 deficiency       Relevant Orders   Vitamin B12 (Completed)   Intrinsic Factor Antibodies (Completed)   Status post mastectomy, bilateral       Relevant Orders   DME Other see comment   Vitamin D deficiency       Relevant Orders   VITAMIN D 25 Hydroxy (Vit-D Deficiency, Fractures) (Completed)      I am having Ms. Ohlendorf maintain her calcium carbonate, ipratropium, carboxymethylcellulose, acetaminophen, nitroGLYCERIN, ELIQUIS, lisinopril, warfarin, indapamide, isosorbide mononitrate, and potassium chloride.  No orders of the defined types were placed in  this encounter.   There are no discontinued medications.  Follow-up: Return in about 6 months (around 10/07/2017).   Sherlene Shams, MD

## 2017-04-07 NOTE — Patient Instructions (Addendum)
For your neck pain:  You can take 2 tylenol every 12 hours or  One tylenol every 6 hours   Maximum daily dose  2000  mg  If you are taking it daily   START BY PUTTING A TYLENOL TABLET IN EACH PILL BOX

## 2017-04-07 NOTE — Progress Notes (Signed)
Pre visit review using our clinic review tool, if applicable. No additional management support is needed unless otherwise documented below in the visit note. 

## 2017-04-09 LAB — INTRINSIC FACTOR ANTIBODIES: Intrinsic Factor: NEGATIVE

## 2017-04-10 DIAGNOSIS — Z0000 Encounter for general adult medical examination without abnormal findings: Secondary | ICD-10-CM | POA: Insufficient documentation

## 2017-04-10 DIAGNOSIS — Z7189 Other specified counseling: Secondary | ICD-10-CM | POA: Insufficient documentation

## 2017-04-10 NOTE — Assessment & Plan Note (Signed)
She continues to function and perform all ADLs independently.   Lab Results  Component Value Date   VITAMINB12 282 04/07/2017   Lab Results  Component Value Date   TSH 1.63 04/07/2017

## 2017-04-10 NOTE — Assessment & Plan Note (Signed)
Well controlled on current regimen. Renal function stable, no changes today.  Lab Results  Component Value Date   CREATININE 1.27 (H) 04/07/2017   Lab Results  Component Value Date   NA 139 04/07/2017   K 4.2 04/07/2017   CL 101 04/07/2017   CO2 29 04/07/2017

## 2017-04-10 NOTE — Assessment & Plan Note (Addendum)
Annual comprehensive preventive exam was done as well as an evaluation and management of chronic conditions .

## 2017-04-10 NOTE — Assessment & Plan Note (Signed)
During the course of the visit the patient was asked about advanced directives and end of life issues.  She prefers to remain FULL CODE DESPITE HER AGE   And despite discussion of the physical risk of harm that could occur with CPR  .

## 2017-04-10 NOTE — Assessment & Plan Note (Signed)
Treated initially with 12 weekly injections x 3, them monthly. Now on oral supplements  Lab Results  Component Value Date   VITAMINB12 282 04/07/2017

## 2017-04-10 NOTE — Assessment & Plan Note (Signed)
Continue eliquis  For history of dvt and cva.

## 2017-04-10 NOTE — Assessment & Plan Note (Signed)
Continue use of warfarin

## 2017-04-12 ENCOUNTER — Other Ambulatory Visit: Payer: Self-pay | Admitting: Internal Medicine

## 2017-04-25 ENCOUNTER — Other Ambulatory Visit: Payer: Self-pay | Admitting: Internal Medicine

## 2017-04-30 ENCOUNTER — Other Ambulatory Visit: Payer: Self-pay | Admitting: Cardiovascular Disease

## 2017-04-30 ENCOUNTER — Other Ambulatory Visit: Payer: Self-pay | Admitting: Internal Medicine

## 2017-04-30 NOTE — Telephone Encounter (Signed)
Pt needs f/u appt with Arida. Thanks! 

## 2017-05-04 NOTE — Telephone Encounter (Signed)
Pt is coming in 05/14/17 to see Dr [name]

## 2017-05-14 ENCOUNTER — Ambulatory Visit: Payer: Medicare Other | Admitting: Cardiovascular Disease

## 2017-05-18 ENCOUNTER — Other Ambulatory Visit: Payer: Self-pay | Admitting: Cardiovascular Disease

## 2017-06-01 ENCOUNTER — Ambulatory Visit: Payer: Medicare Other | Admitting: Cardiovascular Disease

## 2017-06-02 ENCOUNTER — Other Ambulatory Visit: Payer: Self-pay | Admitting: Cardiovascular Disease

## 2017-06-30 ENCOUNTER — Other Ambulatory Visit: Payer: Self-pay | Admitting: Cardiovascular Disease

## 2017-07-02 ENCOUNTER — Other Ambulatory Visit: Payer: Self-pay | Admitting: Internal Medicine

## 2017-07-18 ENCOUNTER — Other Ambulatory Visit: Payer: Self-pay | Admitting: Cardiovascular Disease

## 2017-07-27 ENCOUNTER — Other Ambulatory Visit: Payer: Self-pay | Admitting: Internal Medicine

## 2017-07-29 ENCOUNTER — Other Ambulatory Visit: Payer: Self-pay | Admitting: Cardiovascular Disease

## 2017-07-29 ENCOUNTER — Other Ambulatory Visit: Payer: Self-pay | Admitting: Internal Medicine

## 2017-08-03 ENCOUNTER — Ambulatory Visit (INDEPENDENT_AMBULATORY_CARE_PROVIDER_SITE_OTHER): Payer: Medicare Other | Admitting: Cardiovascular Disease

## 2017-08-03 ENCOUNTER — Encounter: Payer: Self-pay | Admitting: Cardiovascular Disease

## 2017-08-03 VITALS — BP 110/70 | HR 65 | Ht 66.0 in | Wt 106.8 lb

## 2017-08-03 DIAGNOSIS — I482 Chronic atrial fibrillation, unspecified: Secondary | ICD-10-CM

## 2017-08-03 DIAGNOSIS — I10 Essential (primary) hypertension: Secondary | ICD-10-CM

## 2017-08-03 DIAGNOSIS — I25118 Atherosclerotic heart disease of native coronary artery with other forms of angina pectoris: Secondary | ICD-10-CM | POA: Diagnosis not present

## 2017-08-03 NOTE — Patient Instructions (Signed)
Medication Instructions: Continue same medications.   Labwork: None.   Procedures/Testing: None.   Follow-Up: 9 months with Dr. Arida.   Any Additional Special Instructions Will Be Listed Below (If Applicable).     If you need a refill on your cardiac medications before your next appointment, please call your pharmacy.   

## 2017-08-03 NOTE — Progress Notes (Signed)
Cardiology Office Note   Date:  08/03/2017   ID:  Latoya Merritt, DOB 11/25/1920, MRN [id number]  PCP:  Sherlene Shamsullo, Teresa L, MD  Cardiologist:   Lorine BearsMuhammad Nathifa Ritthaler, MD   Chief Complaint  Patient presents with  . other    6 month follow up. Meds reviewed by the pt. verbally. Pt. c/o chest tightness.       History of Present Illness: Latoya Merritt is a 83 y.o. female who presents for a follow-up visit regarding chronic atrial fibrillation . Other medical problems include hyperlipidemia , history of DVT, hypertension, bilateral mastectomy and previous stroke in 2013 while she was temporarily off anticoagulation. She did have a previous non-ST elevation MI with peak troponin 2.5 felt secondary to demand ischemia. No cardiac workup was performed apart from echocardiogram. Echocardiogram showed normal LV systolic function.  She used to be on anticoagulation with warfarin but was switched to Eliquis. She has been doing well and denies any  shortness of breath or palpitations. She continues to have intermittent episodes of chest pain with certain activities but this has not worsened recently.   Past Medical History:  Diagnosis Date  . Atrial fibrillation (HCC)   . Colon polyp 2008  . Coronary artery disease   . DVT (deep venous thrombosis) [facility](HCC) May 2007  . History of DVT of lower extremity May 2007  . Hyperlipidemia   . Hypertension   . Polyp, stomach   . Sensorineural hearing loss 2006   sudden, left ear  . Stroke [facility](HCC)     Past Surgical History:  Procedure Laterality Date  . ABDOMINAL HYSTERECTOMY  1965  . APPENDECTOMY  1950  . BREAST SURGERY  1972   mastectomies, presumed due to breast CA  . LAMINECTOMY  1975  . LASER SURGERY FOR VARICOSE VEINS    . NASAL POLYP SURGERY  2008   Dr. Jenne CampusMcQueen, has been horase ever since  . THYROID SURGERY  1977   nodule removed  . TONSILLECTOMY  1943     Current Outpatient Prescriptions  Medication Sig Dispense Refill  . acetaminophen  (TYLENOL) 500 MG tablet Take 500 mg by mouth every 6 (six) hours as needed for pain.    . calcium carbonate (OS-CAL) 600 MG TABS Take 600 mg by mouth daily.      . carboxymethylcellulose (REFRESH PLUS) 0.5 % SOLN 1 drop 3 (three) times daily as needed.    . cholecalciferol (VITAMIN D) 1000 units tablet Take 1,000 Units by mouth daily.    . Cyanocobalamin (VITAMIN B 12) 100 MCG LOZG Take by mouth daily.    Marland Kitchen. ELIQUIS 2.5 MG TABS tablet TAKE 1 TABLET(2.5 MG) BY MOUTH TWICE DAILY 180 tablet 3  . indapamide (LOZOL) 2.5 MG tablet TAKE 1 TABLET BY MOUTH EVERY MORNING 30 tablet 0  . ipratropium (ATROVENT) 0.06 % nasal spray Place 2 sprays into the nose daily.    . isosorbide mononitrate (IMDUR) 60 MG 24 hr tablet TAKE 1 TABLET BY MOUTH DAILY 30 tablet 0  . lisinopril (PRINIVIL,ZESTRIL) 20 MG tablet TAKE 1 TABLET(20 MG) BY MOUTH DAILY 90 tablet 0  . nitroGLYCERIN (NITROSTAT) 0.4 MG SL tablet Place 1 tablet (0.4 mg total) under the tongue every 5 (five) minutes as needed. 10 tablet 0  . potassium chloride (K-DUR) 10 MEQ tablet TAKE 1 TABLET BY MOUTH TWICE DAILY 60 tablet 2  . warfarin (COUMADIN) 2 MG tablet TAKE 1 TABLET(2 MG) BY MOUTH DAILY 30 tablet 0   No current  facility-administered medications for this visit.     Allergies:   Amoxicillin; Amoxicillin-pot clavulanate; Cephalexin; Cephalexin; Clarithromycin; Clindamycin/lincomycin; Lansoprazole; Lisinopril; Macrodantin; Sulfa antibiotics; and Tussionex pennkinetic er [hydrocod polst-cpm polst er]    Social History:  The patient  reports that she has never smoked. She has never used smokeless tobacco. She reports that she does not drink alcohol or use drugs.   Family History:  The patient's family history includes Cancer in her other; Heart failure in her brother and mother.    ROS:  Please see the history of present illness.   Otherwise, review of systems are positive for none.   All other systems are reviewed and negative.    PHYSICAL  EXAM: VS:  BP 110/70 (BP Location: Left Arm, Patient Position: Sitting, Cuff Size: Normal)   Pulse 65   Ht 5\' 6"  (1.676 m)   Wt 106 lb 12 oz (48.4 kg)   BMI 17.23 kg/m  , BMI Body mass index is 17.23 kg/m. GEN: Well nourished, well developed, in no acute distress  HEENT: normal  Neck: no JVD, carotid bruits, or masses Cardiac: Irregularly irregular; no murmurs, rubs, or gallops,no edema  Respiratory:  clear to auscultation bilaterally, normal work of breathing GI: soft, nontender, nondistended, + BS MS: no deformity or atrophy  Skin: warm and dry, no rash Neuro:  Strength and sensation are intact Psych: euthymic mood, full affect   EKG:  EKG is ordered today. The ekg ordered today demonstrates atrial fibrillation with possible old anteroseptal infarct.T wave changes suggestive of possible inferior ischemia. Minimal LVH.   Recent Labs: 04/07/2017: ALT 17; BUN 22; Creatinine, Ser 1.27; Hemoglobin 11.9; Platelets 170.0; Potassium 4.2; Sodium 139; TSH 1.63    Lipid Panel    Component Value Date/Time   CHOL 197 10/23/2012 0400   TRIG 137 10/23/2012 0400   HDL 61 (H) 10/23/2012 0400   VLDL 27 10/23/2012 0400   LDLCALC 109 (H) 10/23/2012 0400      Wt Readings from Last 3 Encounters:  08/03/17 106 lb 12 oz (48.4 kg)  04/07/17 113 lb 9.6 oz (51.5 kg)  09/08/16 112 lb 6.4 oz (51 kg)         ASSESSMENT AND PLAN:  1.  Chronic atrial fibrillation: Ventricular rate is well controlled without any medications.  He is tolerating anticoagulation with Eliquis. I reviewed her labs In April which showed stable renal function with a creatinine of 1.27. Hemoglobin was also stable at 11.9.  2. Essential hypertension: Blood pressure is  well controlled on current medications  3. Stable class II angina: No recent worsening. Continue medical therapy.    Disposition:   FU with me in 9 months  Signed,  Lorine Bears, MD  08/03/2017 3:32 PM    [facility]

## 2017-08-13 NOTE — Telephone Encounter (Signed)
error 

## 2017-08-16 ENCOUNTER — Other Ambulatory Visit: Payer: Self-pay | Admitting: Cardiovascular Disease

## 2017-08-16 NOTE — Telephone Encounter (Signed)
Refill Request. Please advise if ok to refill . 

## 2017-08-25 ENCOUNTER — Other Ambulatory Visit: Payer: Self-pay | Admitting: Cardiovascular Disease

## 2017-10-08 ENCOUNTER — Encounter: Payer: Self-pay | Admitting: Internal Medicine

## 2017-10-08 ENCOUNTER — Ambulatory Visit (INDEPENDENT_AMBULATORY_CARE_PROVIDER_SITE_OTHER): Payer: Medicare Other | Admitting: Internal Medicine

## 2017-10-08 VITALS — BP 159/82 | HR 72 | Temp 98.0°F | Resp 15 | Ht 66.0 in | Wt 107.4 lb

## 2017-10-08 DIAGNOSIS — I10 Essential (primary) hypertension: Secondary | ICD-10-CM

## 2017-10-08 DIAGNOSIS — Z7901 Long term (current) use of anticoagulants: Secondary | ICD-10-CM

## 2017-10-08 DIAGNOSIS — Z23 Encounter for immunization: Secondary | ICD-10-CM | POA: Diagnosis not present

## 2017-10-08 DIAGNOSIS — E538 Deficiency of other specified B group vitamins: Secondary | ICD-10-CM | POA: Diagnosis not present

## 2017-10-08 DIAGNOSIS — E782 Mixed hyperlipidemia: Secondary | ICD-10-CM | POA: Diagnosis not present

## 2017-10-08 DIAGNOSIS — F0150 Vascular dementia without behavioral disturbance: Secondary | ICD-10-CM

## 2017-10-08 LAB — COMPREHENSIVE METABOLIC PANEL
ALT: 17 U/L (ref 0–35)
AST: 27 U/L (ref 0–37)
Albumin: 4.2 g/dL (ref 3.5–5.2)
Alkaline Phosphatase: 41 U/L (ref 39–117)
BILIRUBIN TOTAL: 0.7 mg/dL (ref 0.2–1.2)
BUN: 17 mg/dL (ref 6–23)
CALCIUM: 10.2 mg/dL (ref 8.4–10.5)
CHLORIDE: 98 meq/L (ref 96–112)
CO2: 32 meq/L (ref 19–32)
Creatinine, Ser: 1.24 mg/dL — ABNORMAL HIGH (ref 0.40–1.20)
GFR: 42.68 mL/min — AB (ref >60.00)
Glucose, Bld: 104 mg/dL — ABNORMAL HIGH (ref 70–99)
POTASSIUM: 3.7 meq/L (ref 3.5–5.1)
Sodium: 137 mEq/L (ref 135–145)
Total Protein: 7.2 g/dL (ref 6.0–8.3)

## 2017-10-08 LAB — CBC WITH DIFFERENTIAL/PLATELET
BASOS PCT: 1.2 % (ref 0.0–3.0)
Basophils Absolute: 0.1 10*3/uL (ref 0.0–0.1)
EOS ABS: 0 10*3/uL (ref 0.0–0.7)
EOS PCT: 0.6 % (ref 0.0–5.0)
HEMATOCRIT: 36.8 % (ref 36.0–46.0)
HEMOGLOBIN: 12 g/dL (ref 12.0–15.0)
LYMPHS PCT: 34.8 % (ref 12.0–46.0)
Lymphs Abs: 1.5 10*3/uL (ref 0.7–4.0)
MCHC: 32.5 g/dL (ref 30.0–36.0)
MCV: 98.9 fl (ref 78.0–100.0)
MONOS PCT: 5.4 % (ref 3.0–12.0)
Monocytes Absolute: 0.2 10*3/uL (ref 0.1–1.0)
Neutro Abs: 2.5 10*3/uL (ref 1.4–7.7)
Neutrophils Relative %: 58 % (ref 43.0–77.0)
Platelets: 163 10*3/uL (ref 150.0–400.0)
RBC: 3.72 Mil/uL — ABNORMAL LOW (ref 3.87–5.11)
RDW: 12.4 % (ref 11.5–15.5)
WBC: 4.3 10*3/uL (ref 4.0–10.5)

## 2017-10-08 LAB — VITAMIN B12: Vitamin B-12: >1500 pg/mL — ABNORMAL HIGH (ref 211–911)

## 2017-10-08 NOTE — Patient Instructions (Addendum)
You look younger every time  I see you!   I HAVE NO DOUBT THAT YOU WILL LIVE TO SEE 89!    I want you to get your blood pressure checked a few times over the next MONTH  and send me the readings

## 2017-10-08 NOTE — Progress Notes (Signed)
Subjective:  Patient ID: [name], female    DOB: [birth year]  Age: 83 y.o. MRN: [id number]  CC: The primary encounter diagnosis was Essential hypertension. Diagnoses of B12 deficiency, Long term current use of anticoagulant therapy, Encounter for immunization, Vascular dementia without behavioral disturbance, and Mixed hyperlipidemia were also pertinent to this visit.  HPI ALMER LITTLETON presents for 6 month follow up on chronic conditions including hypertension    Seeing  Arida for atrial fibrillation, chronic with prior stroke. .taking eliquis   Working out in her yard tending roses ,  No falls ,  No insect bites.  Walking for 15 minutes daily  With some weakness noted in left ankle  due to old injury  Does not use cane unless walking longer distance than 15 min . Grocery  Shops alone.  Still driving during the day,  No accidents .  Cleans her own house.   Has occasional chest tightness that resolves with rest.      Bowels moving regularly.  No urinary incontinence.   Outpatient Medications Prior to Visit  Medication Sig Dispense Refill  . acetaminophen (TYLENOL) 500 MG tablet Take 500 mg by mouth every 6 (six) hours as needed for pain.    . calcium carbonate (OS-CAL) 600 MG TABS Take 600 mg by mouth daily.      . carboxymethylcellulose (REFRESH PLUS) 0.5 % SOLN 1 drop 3 (three) times daily as needed.    . cholecalciferol (VITAMIN D) 1000 units tablet Take 1,000 Units by mouth daily.    . Cyanocobalamin (VITAMIN B 12) 100 MCG LOZG Take by mouth daily.    Marland Kitchen ELIQUIS 2.5 MG TABS tablet TAKE 1 TABLET(2.5 MG) BY MOUTH TWICE DAILY 180 tablet 3  . indapamide (LOZOL) 2.5 MG tablet TAKE 1 TABLET BY MOUTH EVERY MORNING 30 tablet 0  . ipratropium (ATROVENT) 0.06 % nasal spray Place 2 sprays into the nose daily.    . isosorbide mononitrate (IMDUR) 60 MG 24 hr tablet TAKE 1 TABLET BY MOUTH DAILY 90 tablet 0  . lisinopril (PRINIVIL,ZESTRIL) 20 MG tablet TAKE 1 TABLET(20 MG) BY MOUTH DAILY 90  tablet 0  . nitroGLYCERIN (NITROSTAT) 0.4 MG SL tablet Place 1 tablet (0.4 mg total) under the tongue every 5 (five) minutes as needed. 10 tablet 0  . potassium chloride (K-DUR) 10 MEQ tablet TAKE 1 TABLET BY MOUTH TWICE DAILY 60 tablet 2  . indapamide (LOZOL) 2.5 MG tablet TAKE 1 TABLET BY MOUTH EVERY MORNING (Patient not taking: Reported on 10/08/2017) 30 tablet 3   No facility-administered medications prior to visit.     Review of Systems;  Patient denies headache, fevers, malaise, unintentional weight loss, skin rash, eye pain, sinus congestion and sinus pain, sore throat, dysphagia,  hemoptysis , cough, dyspnea, wheezing, chest pain, palpitations, orthopnea, edema, abdominal pain, nausea, melena, diarrhea, constipation, flank pain, dysuria, hematuria, urinary  Frequency, nocturia, numbness, tingling, seizures,  Focal weakness, Loss of consciousness,  Tremor, insomnia, depression, anxiety, and suicidal ideation.      Objective:  BP (!) 159/82 (BP Location: Right Arm, Patient Position: Sitting, Cuff Size: Normal)   Pulse 72   Temp 98 F (36.7 C) (Oral)   Resp 15   Ht 5\' 6"  (1.676 m)   Wt 107 lb 6.4 oz (48.7 kg)   SpO2 98%   BMI 17.33 kg/m   BP Readings from Last 3 Encounters:  10/08/17 (!) 159/82  08/03/17 110/70  04/07/17 (!) 160/82    Wt Readings from  Last 3 Encounters:  10/08/17 107 lb 6.4 oz (48.7 kg)  08/03/17 106 lb 12 oz (48.4 kg)  04/07/17 113 lb 9.6 oz (51.5 kg)    General appearance: alert, cooperative and appears stated age Ears: normal TM's and external ear canals both ears Throat: lips, mucosa, and tongue normal; teeth and gums normal Neck: no adenopathy, no carotid bruit, supple, symmetrical, trachea midline and thyroid not enlarged, symmetric, no tenderness/mass/nodules Back: symmetric, no curvature. ROM normal. No CVA tenderness. Lungs: clear to auscultation bilaterally Heart: regular rate and rhythm, S1, S2 normal, no murmur, click, rub or  gallop Abdomen: soft, non-tender; bowel sounds normal; no masses,  no organomegaly Pulses: 2+ and symmetric Skin: Skin color, texture, turgor normal. No rashes or lesions Lymph nodes: Cervical, supraclavicular, and axillary nodes normal.  No results found for: HGBA1C  Lab Results  Component Value Date   CREATININE 1.24 (H) 10/08/2017   CREATININE 1.27 (H) 04/07/2017   CREATININE 1.26 (H) 07/30/2016    Lab Results  Component Value Date   WBC 4.3 10/08/2017   HGB 12.0 10/08/2017   HCT 36.8 10/08/2017   PLT 163.0 10/08/2017   GLUCOSE 104 (H) 10/08/2017   CHOL 197 10/23/2012   TRIG 137 10/23/2012   HDL 61 (H) 10/23/2012   LDLCALC 109 (H) 10/23/2012   ALT 17 10/08/2017   AST 27 10/08/2017   NA 137 10/08/2017   K 3.7 10/08/2017   CL 98 10/08/2017   CREATININE 1.24 (H) 10/08/2017   BUN 17 10/08/2017   CO2 32 10/08/2017   TSH 1.63 04/07/2017   INR 2.2 (H) 02/13/2016    No results found.  Assessment & Plan:   Problem List Items Addressed This Visit    Hyperlipidemia    Simvastatin intolerant.  Given her age,  No further therapy or workup is planned.   Lab Results  Component Value Date   CHOL 197 10/23/2012   HDL 61 (H) 10/23/2012   LDLCALC 109 (H) 10/23/2012   TRIG 137 10/23/2012         Hypertension - Primary    Well controlled on current regimen. Renal function stable, no changes today.  Lab Results  Component Value Date   CREATININE 1.24 (H) 10/08/2017   Lab Results  Component Value Date   NA 137 10/08/2017   K 3.7 10/08/2017   CL 98 10/08/2017   CO2 32 10/08/2017         Relevant Orders   Comprehensive metabolic panel (Completed)   Long term current use of anticoagulant therapy    Taking eliquis  Due to difficulty regulating coumadin dose. hgb normal   Lab Results  Component Value Date   WBC 4.3 10/08/2017   HGB 12.0 10/08/2017   HCT 36.8 10/08/2017   MCV 98.9 10/08/2017   PLT 163.0 10/08/2017         Relevant Orders   CBC with  Differential/Platelet (Completed)   Vascular dementia without behavioral disturbance    Mild, non progressive  Continues to manage her own household.  No ER visits        Other Visit Diagnoses    B12 deficiency       Relevant Orders   Vitamin B12 (Completed)   Encounter for immunization       Relevant Orders   Flu vaccine HIGH DOSE PF (Completed)      I am having Ms. Portales maintain her calcium carbonate, ipratropium, carboxymethylcellulose, acetaminophen, nitroGLYCERIN, ELIQUIS, indapamide, lisinopril, potassium chloride, cholecalciferol, Vitamin B 12,  and isosorbide mononitrate.  No orders of the defined types were placed in this encounter.   Medications Discontinued During This Encounter  Medication Reason  . indapamide (LOZOL) 2.5 MG tablet Patient has not taken in last 30 days    Follow-up: No Follow-up on file.   Sherlene ShamsULLO, Dekari Bures L, MD

## 2017-10-10 NOTE — Assessment & Plan Note (Signed)
Simvastatin intolerant.  Given her age,  No further therapy or workup is planned.   Lab Results  Component Value Date   CHOL 197 10/23/2012   HDL 61 (H) 10/23/2012   LDLCALC 109 (H) 10/23/2012   TRIG 137 10/23/2012

## 2017-10-10 NOTE — Assessment & Plan Note (Signed)
Well controlled on current regimen. Renal function stable, no changes today.  Lab Results  Component Value Date   CREATININE 1.24 (H) 10/08/2017   Lab Results  Component Value Date   NA 137 10/08/2017   K 3.7 10/08/2017   CL 98 10/08/2017   CO2 32 10/08/2017

## 2017-10-10 NOTE — Assessment & Plan Note (Addendum)
Taking eliquis  Due to difficulty regulating coumadin dose. hgb normal   Lab Results  Component Value Date   WBC 4.3 10/08/2017   HGB 12.0 10/08/2017   HCT 36.8 10/08/2017   MCV 98.9 10/08/2017   PLT 163.0 10/08/2017

## 2017-10-10 NOTE — Assessment & Plan Note (Signed)
Mild, non progressive  Continues to manage her own household.  No ER visits

## 2017-10-21 ENCOUNTER — Other Ambulatory Visit: Payer: Self-pay | Admitting: Internal Medicine

## 2017-10-25 ENCOUNTER — Other Ambulatory Visit: Payer: Self-pay | Admitting: Internal Medicine

## 2017-10-27 ENCOUNTER — Other Ambulatory Visit: Payer: Self-pay | Admitting: Cardiovascular Disease

## 2017-11-23 ENCOUNTER — Other Ambulatory Visit: Payer: Self-pay | Admitting: Internal Medicine

## 2017-11-25 ENCOUNTER — Other Ambulatory Visit: Payer: Self-pay | Admitting: Cardiovascular Disease

## 2017-12-01 ENCOUNTER — Other Ambulatory Visit: Payer: Self-pay | Admitting: Cardiovascular Disease

## 2017-12-01 ENCOUNTER — Other Ambulatory Visit: Payer: Self-pay | Admitting: Internal Medicine

## 2017-12-17 ENCOUNTER — Telehealth: Payer: Self-pay | Admitting: Cardiovascular Disease

## 2017-12-17 ENCOUNTER — Emergency Department
Admission: EM | Admit: 2017-12-17 | Discharge: 2017-12-17 | Disposition: A | Discharge status: Home / Self Care | Payer: Medicare Other | Attending: Emergency Medicine | Admitting: Emergency Medicine

## 2017-12-17 ENCOUNTER — Encounter: Payer: Self-pay | Admitting: Emergency Medicine

## 2017-12-17 ENCOUNTER — Ambulatory Visit: Payer: Self-pay | Admitting: *Deleted

## 2017-12-17 ENCOUNTER — Emergency Department: Payer: Medicare Other

## 2017-12-17 DIAGNOSIS — Y9301 Activity, walking, marching and hiking: Secondary | ICD-10-CM | POA: Insufficient documentation

## 2017-12-17 DIAGNOSIS — I10 Essential (primary) hypertension: Secondary | ICD-10-CM | POA: Diagnosis not present

## 2017-12-17 DIAGNOSIS — H547 Unspecified visual loss: Secondary | ICD-10-CM | POA: Diagnosis not present

## 2017-12-17 DIAGNOSIS — Z8673 Personal history of transient ischemic attack (TIA), and cerebral infarction without residual deficits: Secondary | ICD-10-CM | POA: Insufficient documentation

## 2017-12-17 DIAGNOSIS — S0990XA Unspecified injury of head, initial encounter: Secondary | ICD-10-CM | POA: Insufficient documentation

## 2017-12-17 DIAGNOSIS — I2510 Atherosclerotic heart disease of native coronary artery without angina pectoris: Secondary | ICD-10-CM | POA: Insufficient documentation

## 2017-12-17 DIAGNOSIS — W19XXXA Unspecified fall, initial encounter: Secondary | ICD-10-CM

## 2017-12-17 DIAGNOSIS — S7001XA Contusion of right hip, initial encounter: Secondary | ICD-10-CM | POA: Diagnosis not present

## 2017-12-17 DIAGNOSIS — W010XXA Fall on same level from slipping, tripping and stumbling without subsequent striking against object, initial encounter: Secondary | ICD-10-CM | POA: Diagnosis not present

## 2017-12-17 DIAGNOSIS — Z79899 Other long term (current) drug therapy: Secondary | ICD-10-CM | POA: Diagnosis not present

## 2017-12-17 DIAGNOSIS — Y999 Unspecified external cause status: Secondary | ICD-10-CM | POA: Diagnosis not present

## 2017-12-17 DIAGNOSIS — Y92000 Kitchen of unspecified non-institutional (private) residence as  the place of occurrence of the external cause: Secondary | ICD-10-CM | POA: Diagnosis not present

## 2017-12-17 NOTE — Telephone Encounter (Signed)
Pt calling stating she just got of the phone with Dr Melina Schoolsullo's office   She states she had a fell and bumped her head on floor.   She had a bump on right eye and its not come down to near her eye.  There's a bump near her nose. She is not seeing clearly and states it is like seeing "christmas lights"  She states she was advised to call us for she is on a blood thinner  Not sure what to do she states.  But would like some advise   Please call

## 2017-12-17 NOTE — ED Notes (Signed)
Xray unable to come now to do imaging.

## 2017-12-17 NOTE — ED Notes (Signed)
Pt sustained injury to R hip and visual changes consistent w/ retinal detachment. Pt fell x 4 days ago. Pt came to ED for visual changes.

## 2017-12-17 NOTE — ED Provider Notes (Signed)
[facility] Emergency Department Provider Note  ____________________________________________  Time seen: Approximately 4:35 PM  I have reviewed the triage vital signs and the nursing notes.   HISTORY  Chief Complaint Fall   HPI Latoya Merritt is a 83 y.o. female With a history of atrial fibrillation and DVT on Eliquis, CAD, HTN, CVA who presents for evaluation of visual changes status post mechanical fall. Patient reports 4 days ago she tripped while getting into her kitchen from the carport. She fell face forward onto the tile floor. She hit the right side of her face. She denies LOC. She has noted since then progressively worsening vision on the right eye with flashing lights and decreased visual acuity. Visual acuity is now severely diminished in that eye. This symptom has been constant since earlier today. She also noticed a hematoma on her right hip which has gotten progressively larger over the last few days. She denies any headache or hip pain and has been able to ambulate without difficulty. She denies neck pain, back pain, or any extremity pain. Patient is concerned about her visual changes because she is on Eliquis and is afraid she may have a brain bleed.   Past Medical History:  Diagnosis Date  . Atrial fibrillation (HCC)   . Colon polyp 2008  . Coronary artery disease   . DVT (deep venous thrombosis) [facility]) May 2007  . History of DVT of lower extremity May 2007  . Hyperlipidemia   . Hypertension   . Polyp, stomach   . Sensorineural hearing loss 2006   sudden, left ear  . Stroke [facility])     Patient Active Problem List   Diagnosis Date Noted  . Routine adult health maintenance 04/10/2017  . Advance directive discussed with patient 04/10/2017  . B12 deficiency anemia 08/01/2016  . Long term current use of anticoagulant therapy 10/11/2014  . Urinary frequency 08/09/2014  . Elbow pain, chronic 06/05/2014  . History of non-ST elevation  myocardial infarction (NSTEMI) 06/11/2013  . Bradycardia, sinus 12/22/2012  . Coronary artery disease   . Vascular dementia without behavioral disturbance 12/31/2011  . History of CVA (cerebrovascular accident) 12/31/2011  . Hyperlipidemia   . Hypertension   . Colon polyp   . History of DVT of lower extremity   . Atrial fibrillation (HCC) 08/27/2010  . CHEST PAIN 08/27/2010  . ABNORMAL CV (STRESS) TEST 08/27/2010  . History of DVT (deep vein thrombosis) 04/20/2006    Past Surgical History:  Procedure Laterality Date  . ABDOMINAL HYSTERECTOMY  1965  . APPENDECTOMY  1950  . BREAST SURGERY  1972   mastectomies, presumed due to breast CA  . LAMINECTOMY  1975  . LASER SURGERY FOR VARICOSE VEINS    . NASAL POLYP SURGERY  2008   Dr. Jenne Campus, has been horase ever since  . THYROID SURGERY  1977   nodule removed  . TONSILLECTOMY  1943    Prior to Admission medications   Medication Sig Start Date End Date Taking? Authorizing Provider  acetaminophen (TYLENOL) 500 MG tablet Take 500 mg by mouth every 6 (six) hours as needed for pain.    [provider]  calcium carbonate (OS-CAL) 600 MG TABS Take 600 mg by mouth daily.      [provider]  carboxymethylcellulose (REFRESH PLUS) 0.5 % SOLN 1 drop 3 (three) times daily as needed.    [provider]  cholecalciferol (VITAMIN D) 1000 units tablet Take 1,000 Units by mouth daily.  [provider]  Cyanocobalamin (VITAMIN B 12) 100 MCG LOZG Take by mouth daily.    [provider]  ELIQUIS 2.5 MG TABS tablet TAKE 1 TABLET(2.5 MG) BY MOUTH TWICE DAILY 10/27/17   Iran OuchArida, Muhammad A, MD  indapamide (LOZOL) 2.5 MG tablet TAKE 1 TABLET BY MOUTH EVERY MORNING 07/19/17   Iran OuchArida, Muhammad A, MD  indapamide (LOZOL) 2.5 MG tablet TAKE 1 TABLET BY MOUTH EVERY MORNING 12/01/17   Iran OuchArida, Muhammad A, MD  ipratropium (ATROVENT) 0.06 % nasal spray Place 2 sprays into the nose daily.    [provider]    isosorbide mononitrate (IMDUR) 60 MG 24 hr tablet TAKE 1 TABLET BY MOUTH DAILY 11/25/17   Iran OuchArida, Muhammad A, MD  lisinopril (PRINIVIL,ZESTRIL) 20 MG tablet TAKE 1 TABLET(20 MG) BY MOUTH DAILY 10/22/17   Sherlene Shamsullo, Teresa L, MD  nitroGLYCERIN (NITROSTAT) 0.4 MG SL tablet Place 1 tablet (0.4 mg total) under the tongue every 5 (five) minutes as needed. 07/15/15   Iran OuchArida, Muhammad A, MD  potassium chloride (K-DUR) 10 MEQ tablet TAKE 1 TABLET BY MOUTH TWICE DAILY 10/25/17   Sherlene Shamsullo, Teresa L, MD  potassium chloride (K-DUR) 10 MEQ tablet TAKE 1 TABLET BY MOUTH TWICE DAILY 12/03/17   Sherlene Shamsullo, Teresa L, MD    Allergies Amoxicillin; Amoxicillin-pot clavulanate; Cephalexin; Cephalexin; Clarithromycin; Clindamycin/lincomycin; Lansoprazole; Lisinopril; Macrodantin; Sulfa antibiotics; and Tussionex pennkinetic er [hydrocod polst-cpm polst er]  Family History  Problem Relation Age of Onset  . Heart failure Mother   . Heart failure Brother   . Cancer Other     Social History Social History   Tobacco Use  . Smoking status: Never Smoker  . Smokeless tobacco: Never Used  Substance Use Topics  . Alcohol use: No    Alcohol/week: 4.2 oz    Types: 7 Glasses of wine per week    Comment: glass a wine at bedtime  . Drug use: No    Review of Systems Constitutional: Negative for fever. Eyes: + R visual changes. ENT: Negative for facial injury or neck injury Cardiovascular: Negative for chest injury. Respiratory: Negative for shortness of breath. Negative for chest wall injury. Gastrointestinal: Negative for abdominal pain or injury. Genitourinary: Negative for dysuria. Musculoskeletal: Negative for back injury, negative for arm or leg pain. + R hip bruise Skin: Negative for laceration/abrasions. Neurological: + head injury.   ____________________________________________   PHYSICAL EXAM:  VITAL SIGNS: ED Triage Vitals [12/17/17 1332]  Enc Vitals Group     BP (!) 174/89     Pulse Rate 89     Resp 18      Temp 97.7 F (36.5 C)     Temp Source Oral     SpO2 100 %     Weight 110 lb (49.9 kg)     Height 5\' 6"  (1.676 m)     Head Circumference      Peak Flow      Pain Score      Pain Loc      Pain Edu?      Excl. in GC?    Constitutional: Alert and oriented. No acute distress. Does not appear intoxicated. HEENT Head: Normocephalic and atraumatic. Face: No facial bony tenderness. Stable midface Ears: No hemotympanum bilaterally. No Battle sign Eyes: No eye injury. No raccoon eyes. R pupil is 2 mm, L pupil is 1 mm, EOMI, both pupils are reactive. Visual acuity on the L with reading glasses is 20/40, on the R patient is able to see fingers and  light only. Nose: Nontender. No epistaxis. No rhinorrhea Mouth/Throat: Mucous membranes are moist. No oropharyngeal blood. No dental injury. Airway patent without stridor. Normal voice. Neck: no C-collar in place. No midline c-spine tenderness.  Cardiovascular: Normal rate, regular rhythm. Normal and symmetric distal pulses are present in all extremities. Pulmonary/Chest: Chest wall is stable and nontender to palpation/compression. Normal respiratory effort. Breath sounds are normal. No crepitus.  Abdominal: Soft, nontender, non distended. Musculoskeletal: There is a large hematoma over the R lateral proximal femur area. Nontender with normal full range of motion in all extremities. No deformities. No thoracic or lumbar midline spinal tenderness. Pelvis is stable. Skin: Skin is warm, dry and intact. No abrasions or contutions. Psychiatric: Speech and behavior are appropriate. Neurological: Normal speech and language. Moves all extremities to command. intact strength and sensation, gait is intact, cranial nerves are intact.  Glascow Coma Score: 4 - Opens eyes on own 6 - Follows simple motor commands 5 - Alert and oriented GCS: 15   ____________________________________________   LABS (all labs ordered are listed, but only abnormal results are  displayed)  Labs Reviewed - No data to display ____________________________________________  EKG  ED ECG REPORT I, Nita Sicklearolina Jissell Trafton, the attending physician, personally viewed and interpreted this ECG.  Atrial fibrillation, rate of 76, normal QTC, normal axis, T-wave inversion in inferior leads, no ST elevations or depressions. unchanged from prior from August 2018  ____________________________________________  RADIOLOGY  Head CT; Negative  ____________________________________________   PROCEDURES  Procedure(s) performed: None Procedures Critical Care performed:  None ____________________________________________   INITIAL IMPRESSION / ASSESSMENT AND PLAN / ED COURSE   83 y.o. female With a history of atrial fibrillation and DVT on Eliquis, CAD, HTN, CVA who presents for evaluation of visual changes status post mechanical fall. pupils are asymmetric but reactive, intact extraocular movements, visual acuity on the left and 20/40 and on the right patient is able to see lights and fingers only. At this time I am concerned for possible traumatic retinal detachment. I spoke with Dr. Lindon RompPorfirio at the eye center who requested that patient is transfer there immediately for evaluation. Head CT is negative.I did order xray of her right femur however the x-ray department is busy and unable to perform this at this time. I have a low suspicion for a fracture since patient has been walking on that leg for 4 days. [facility] and I feel that at this time her eye evaluation is a more pressuring issue therefore I have canceled the x-ray and will [facility]. I recommended that she follows up with her primary care doctor tomorrow or return to the emergency room after her eye evaluation for x-ray of her femur.      As part of my medical decision making, I reviewed the following data within the electronic MEDICAL RECORD NUMBER Nursing notes  reviewed and incorporated, Radiograph reviewed , A consult was requested and obtained from this/these consultant(s) ophthalmology, Notes from prior ED visits and [place] Controlled Substance Database    Pertinent labs & imaging results that were available during my care of the patient were reviewed by me and considered in my medical decision making (see chart for details).    ____________________________________________   FINAL CLINICAL IMPRESSION(S) / ED DIAGNOSES  Final diagnoses:  Fall, initial encounter  Traumatic injury of head, initial encounter  Decreased visual acuity  Hematoma of right hip, initial encounter      NEW  MEDICATIONS STARTED DURING THIS VISIT:  ED Discharge Orders    None       Note:  This document was prepared using Dragon voice recognition software and may include unintentional dictation errors.    Nita Sickle, MD 12/17/17 [phone]

## 2017-12-17 NOTE — ED Notes (Signed)
Pt remains to have double vision. No pain or other symptoms, pt just concerned because of vision changes. Left pupil about 1 mm smaller than right. Unsure if baseline.

## 2017-12-17 NOTE — Telephone Encounter (Signed)
Called patient she is going to ER advised patient she needs to be evaluated to make sure no internal bleeding. Patient agreed and going to ER. FYI

## 2017-12-17 NOTE — ED Triage Notes (Signed)
Pt comes into the ED via POV c/o a fall that occurred earlier today where she hit her head.  Patient is on eliquis and she is now having double vision.  Patient denies any LOC nausea, or vomiting.  Patient present with bruising over the right eye and on her hip.  Patient has even and unlabored respirations and is acting at her baseline according to family.

## 2017-12-17 NOTE — Discharge Instructions (Signed)
Go straight to the eye center to see Dr. Druscilla BrowniePorfilio. Then follow up with your doctor or return to the ER for XR of your hip/.  You were seen in the emergency department after a fall. Luckily all of your imaging studies did not show any evidence of injuries. Follow-up with you doctor within the next 2-3 days for further evaluation. Sometimes injuries can present at a later time and therefore it is imperative that you return to the emergency room if you have a severe headache, facial droop, neck pain, numbness or weakness of your extremities, slurred speech, difficulty finding words, chest pain, back pain, abdominal pain, or any other new symptoms that were not present during this visit. You may take Tylenol at home for your pain.

## 2017-12-17 NOTE — Telephone Encounter (Signed)
I spoke with the patient.  She reports that she fell on her face coming in from her utility room to her kitchen on 12/15/17- she was not syncopal, but appears to have missed her step. She had to call [facility] to help her get up.  EMS was called and she refused transport to the ER as she "felt fine." Today she calls reporting that she has a "black spot" on her eye with "circles" around it and she feels like she is seeing "christmas lights." She is currently on eliquis 2.5 mg BID (a-fib/ CVA/ DVT noted on her history).   She denies a headache and is coherent in speaking with her.  She does report a knot above her right eye and a bruise on her hip.  I have advised her that she should be further evaluated in the ER due to visual disturbance.  She is aware she may require a head CT as well. She does have someone that can drive her and is agreeable with further evaluation in the ER.

## 2017-12-17 NOTE — Telephone Encounter (Signed)
Pt called because she had a fall 2 days ago and now bruising on her hip and lump on her face affecting her eye. She is on a blood thinner.  Advised patient to go to the ED to be assessed.  She wants to go her cardiologist first.  Care giver is with patient now.  Reason for Disposition . Taking Coumadin (warfarin) or other strong blood thinner, or known bleeding disorder (e.g., thrombocytopenia)  Answer Assessment - Initial Assessment Questions 1. APPEARANCE of BRUISE: "Describe the bruise."      lump on right side of face to left side to the eye and bruise on hip 2. SIZE: "How large is the bruise?"      Bruise on hip, size of your fist 3. NUMBER: "How many bruises are there?"     1 on hip 4. LOCATION: "Where is the bruise located?"      Right hip  5. ONSET: "How long ago did the bruise occur?"      Fell 2 days ago 6. CAUSE: "Tell me how it happened."     Coming in from the outside, stepping inside 7. MEDICAL HISTORY: "Do you have any medical problems that can cause easy bruising or bleeding?" (e.g., leukemia, liver disease, recent chemotherapy)     Easy bruising and also bleeding 8. MEDICATIONS : "Do you take any medications which thin the blood such as: aspirin, heparin, ibuprofen (NSAIDS), Plavix, or Coumadin?"     eliquis 9. OTHER SYMPTOMS: "Do you have any other symptoms?"  (e.g., weakness, dizziness, pain, fever, nosebleed, blood in urine/stool)     no 10. PREGNANCY: "Is there any chance you are pregnant?" "When was your last menstrual period?"       n/a  Protocols used: BRUISES-A-AH

## 2018-01-06 ENCOUNTER — Ambulatory Visit: Payer: Medicare Other | Admitting: Family Medicine

## 2018-01-06 ENCOUNTER — Encounter: Payer: Self-pay | Admitting: Family Medicine

## 2018-01-06 VITALS — BP 140/64 | HR 77 | Temp 98.3°F | Ht 66.0 in | Wt 107.2 lb

## 2018-01-06 DIAGNOSIS — R05 Cough: Secondary | ICD-10-CM

## 2018-01-06 DIAGNOSIS — J069 Acute upper respiratory infection, unspecified: Secondary | ICD-10-CM | POA: Diagnosis not present

## 2018-01-06 DIAGNOSIS — R059 Cough, unspecified: Secondary | ICD-10-CM

## 2018-01-06 MED ORDER — BENZONATATE 100 MG PO CAPS: 100.0000 mg | ORAL_CAPSULE | Freq: Three times a day (TID) | ORAL | 0 refills | Status: DC

## 2018-01-06 NOTE — Progress Notes (Signed)
Patient ID: Nemiah Commanderellie G Kozlov, female   DOB: 03/09/1920, 84 y.o.   MRN: [id number]   PCP: Sherlene Shamsullo, Teresa L, MD  Subjective:  Nemiah Commanderellie G Gikas is a 84 y.o. year old very pleasant female patient who presents with Upper Respiratory infection symptoms including mild nasal congestion, mild sore throat which is improving, cough that is minimally productive with clear sputum. -started: 2 days , symptoms are not worsening. -previous treatments: None at this time -sick contacts/travel/risks: denies flu exposure. Influenza vaccine is UTD.  Son visiting who had similar symptoms -Hx of: allergies She is not a smoker  ROS-denies fever, SOB, NVD, tooth pain, sinus pressure/pain  Pertinent Past Medical History- Atrial fibrillation, HTN  Medications- reviewed  Current Outpatient Medications  Medication Sig Dispense Refill  . acetaminophen (TYLENOL) 500 MG tablet Take 500 mg by mouth every 6 (six) hours as needed for pain.    . calcium carbonate (OS-CAL) 600 MG TABS Take 600 mg by mouth daily.      . carboxymethylcellulose (REFRESH PLUS) 0.5 % SOLN 1 drop 3 (three) times daily as needed.    . cholecalciferol (VITAMIN D) 1000 units tablet Take 1,000 Units by mouth daily.    . Cyanocobalamin (VITAMIN B 12) 100 MCG LOZG Take by mouth daily.    Marland Kitchen. ELIQUIS 2.5 MG TABS tablet TAKE 1 TABLET(2.5 MG) BY MOUTH TWICE DAILY 180 tablet 2  . indapamide (LOZOL) 2.5 MG tablet TAKE 1 TABLET BY MOUTH EVERY MORNING 30 tablet 0  . ipratropium (ATROVENT) 0.06 % nasal spray Place 2 sprays into the nose daily.    . isosorbide mononitrate (IMDUR) 60 MG 24 hr tablet TAKE 1 TABLET BY MOUTH DAILY 90 tablet 3  . lisinopril (PRINIVIL,ZESTRIL) 20 MG tablet TAKE 1 TABLET(20 MG) BY MOUTH DAILY 90 tablet 3  . nitroGLYCERIN (NITROSTAT) 0.4 MG SL tablet Place 1 tablet (0.4 mg total) under the tongue every 5 (five) minutes as needed. 10 tablet 0  . potassium chloride (K-DUR) 10 MEQ tablet TAKE 1 TABLET BY MOUTH TWICE DAILY 60 tablet 0  .  potassium chloride (K-DUR) 10 MEQ tablet TAKE 1 TABLET BY MOUTH TWICE DAILY 60 tablet 2   No current facility-administered medications for this visit.     Objective: BP 140/64 (BP Location: Left Arm, Patient Position: Sitting, Cuff Size: Normal)   Pulse 77   Temp 98.3 F (36.8 C) (Oral)   Ht 5\' 6"  (1.676 m)   Wt 107 lb 4 oz (48.6 kg)   SpO2 98%   BMI 17.31 kg/m  Gen: NAD, resting comfortably, thin optimally nourished female HEENT: Turbinates erythematous, TM normal, pharynx mildly erythematous with no tonsilar exudate or edema, no sinus tenderness CV: RRR no murmurs rubs or gallops Lungs: CTAB no crackles, wheeze, rhonchi Abdomen: soft/nontender/nondistended/normal bowel sounds. No rebound or guarding.  Ext: no edema Skin: warm, dry, no rash Neuro: grossly normal, moves all extremities  Assessment/Plan:  1. Acute upper respiratory infection History and exam today are suggestive of viral infection most likely due to upper respiratory infection. Symptomatic treatment with: benzonatate for cough, increasing water intake, acetaminophen, warm salt water gargle for throat, and close follow up if symptoms do not improve, worsen, or she develops a fever. Well appearing today and recent exposure to son with similar symptoms is most likely source of exposure.  We discussed that we did not find any infection that had higher probability of being bacterial such as pneumonia or strep throat. We discussed signs that bacterial infection may have developed  particularly fever or shortness of breath. Likely course of 1-2 weeks. Patient is contagious and advised good handwashing and consideration of mask If going to be in public places.   Finally, we reviewed reasons to return to care including if symptoms worsen or persist or new concerns arise- once again particularly shortness of breath or fever.   2. Cough Lungs CTA, denies SOB, O2 sat 98%. Exam is reassuring for viral infection. We discussed s/sx  of bacterial infection. If symptoms worsen, patient will return to clinic for further evaluation and treatment.   Inez Catalina, FNP

## 2018-01-06 NOTE — Patient Instructions (Signed)
It was a pleasure to see today.  Your symptoms are most likely related to a viral illness. Please drink plenty of water so that your urine is pale yellow or clear. Also, get plenty of rest, use tylenol as needed for discomfort and follow up if symptoms do not improve in 3 to 4 days, worsen, or you develop a fever.  Feel better soon!   Upper Respiratory Infection, Adult Most upper respiratory infections (URIs) are caused by a virus. A URI affects the nose, throat, and upper air passages. The most common type of URI is often called "the common cold." Follow these instructions at home:  Take medicines only as told by your doctor.  Gargle warm saltwater or take cough drops to comfort your throat as told by your doctor.  Use a warm mist humidifier or inhale steam from a shower to increase air moisture. This may make it easier to breathe.  Drink enough fluid to keep your pee (urine) clear or pale yellow.  Eat soups and other clear broths.  Have a healthy diet.  Rest as needed.  Go back to work when your fever is gone or your doctor says it is okay. ? You may need to stay home longer to avoid giving your URI to others. ? You can also wear a face mask and wash your hands often to prevent spread of the virus.  Use your inhaler more if you have asthma.  Do not use any tobacco products, including cigarettes, chewing tobacco, or electronic cigarettes. If you need help quitting, ask your doctor. Contact a doctor if:  You are getting worse, not better.  Your symptoms are not helped by medicine.  You have chills.  You are getting more short of breath.  You have brown or red mucus.  You have yellow or brown discharge from your nose.  You have pain in your face, especially when you bend forward.  You have a fever.  You have puffy (swollen) neck glands.  You have pain while swallowing.  You have white areas in the back of your throat. Get help right away if:  You have very bad  or constant: ? Headache. ? Ear pain. ? Pain in your forehead, behind your eyes, and over your cheekbones (sinus pain). ? Chest pain.  You have long-lasting (chronic) lung disease and any of the following: ? Wheezing. ? Long-lasting cough. ? Coughing up blood. ? A change in your usual mucus.  You have a stiff neck.  You have changes in your: ? Vision. ? Hearing. ? Thinking. ? Mood. This information is not intended to replace advice given to you by your health care provider. Make sure you discuss any questions you have with your health care provider. Document Released: 05/25/2008 Document Revised: 08/09/2016 Document Reviewed: 03/14/2014 Elsevier Interactive Patient Education  2018 [employer]

## 2018-01-07 ENCOUNTER — Encounter: Payer: Self-pay | Admitting: Internal Medicine

## 2018-03-06 ENCOUNTER — Other Ambulatory Visit: Payer: Self-pay | Admitting: Internal Medicine

## 2018-04-04 ENCOUNTER — Other Ambulatory Visit: Payer: Self-pay

## 2018-04-04 MED ORDER — APIXABAN 2.5 MG PO TABS: ORAL_TABLET | ORAL | 3 refills | Status: DC

## 2018-04-04 MED ORDER — INDAPAMIDE 2.5 MG PO TABS: 2.5000 mg | ORAL_TABLET | Freq: Every morning | ORAL | 3 refills | Status: DC

## 2018-04-04 MED ORDER — ISOSORBIDE MONONITRATE ER 60 MG PO TB24: 60.0000 mg | ORAL_TABLET | Freq: Every day | ORAL | 3 refills | Status: DC

## 2018-04-06 ENCOUNTER — Other Ambulatory Visit: Payer: Self-pay

## 2018-04-06 ENCOUNTER — Ambulatory Visit: Payer: Medicare Other | Admitting: Internal Medicine

## 2018-04-06 ENCOUNTER — Encounter: Payer: Self-pay | Admitting: Internal Medicine

## 2018-04-06 VITALS — BP 124/72 | HR 73 | Temp 97.6°F | Resp 15 | Ht 66.0 in | Wt 112.8 lb

## 2018-04-06 DIAGNOSIS — I10 Essential (primary) hypertension: Secondary | ICD-10-CM | POA: Diagnosis not present

## 2018-04-06 DIAGNOSIS — H27131 Posterior dislocation of lens, right eye: Secondary | ICD-10-CM

## 2018-04-06 DIAGNOSIS — Z7901 Long term (current) use of anticoagulants: Secondary | ICD-10-CM

## 2018-04-06 DIAGNOSIS — Z7189 Other specified counseling: Secondary | ICD-10-CM | POA: Diagnosis not present

## 2018-04-06 DIAGNOSIS — R296 Repeated falls: Secondary | ICD-10-CM | POA: Diagnosis not present

## 2018-04-06 MED ORDER — LISINOPRIL 20 MG PO TABS: ORAL_TABLET | ORAL | 1 refills | Status: DC

## 2018-04-06 MED ORDER — POTASSIUM CHLORIDE ER 10 MEQ PO TBCR: 10.0000 meq | EXTENDED_RELEASE_TABLET | Freq: Two times a day (BID) | ORAL | 1 refills | Status: DC

## 2018-04-06 NOTE — Patient Instructions (Addendum)
I am making a Home health referral for Physical Therapy to evaluate your need for a rolling walker with seat and any other medical equipment that will help keep you safe   I am also making a referral to [facility] for your ankle

## 2018-04-06 NOTE — Progress Notes (Signed)
Patient ID: Latoya Merritt, female    DOB: 08/27/1920  Age: 84 y.o. MRN: [id number]  The patient is here for follow up and  management of other chronic and acute problems. .  Treated in ED Dec 28 for mechanical fall accompanied by vision loss in right eye and right hip hematoma.  She used her life Line to call for help.   From the ED she was sent to [facility] emergently,  Diagnosed with lens dislocation, apparently the fall caused her right  lens to move behind the globe, which would require  Temporary enucleation of the globe  To rectify. She is not a good candidate for that surgery. She has been using the prescribed eye drop three times daily  . Has had nearly  100% loss of vision in right eye, with no guarantee that vision would be restored.  Unable to drive.  Son's name is Eston EstersChris Brown, lives in [place] .  Starting to visit her every 6 weeks   Remote history of left ankle ligament strain years ago  Now having instability and difficultyu dorsiflexing her ankle (left) e  Does not feel confidnet putting full weight vbearing and swelling and weakness of ankle ,  Feel like it wants to give way.  We  discussed referral to podiatrist  And for a home health assessment for evaluation of entrances , bathrooms etc.  And facilitate DME order for rolling walker with seat which will require PT evaluation . She can no longer drive due to her loss of vision in right eye.      Treated for viral URI in Jan by NP .  Symptoms have resolved.    Activities of daily living:  The patient  Has historically been 100% independent in all ADLs: dressing, toileting, feeding as well as independent mobility, but she has had to give up driving since the loss of vision.  She is now using a walker since her fall.   Home safety : The patient has smoke detectors in the home. They wear seatbelts.  There are no firearms at home. There is no violence in the home. She wears a Life Alert   There is no risks for hepatitis, STDs or HIV.  There is no   history of blood transfusion. They have no travel history to infectious disease endemic areas of the world.  The patient has seen their dentist in the last six month. They have seen their eye doctor in the last year. They admit to slight hearing difficulty with regard to whispered voices and some television programs.  They have deferred audiologic testing in the last year.  They do not  have excessive sun exposure. Discussed the need for sun protection: hats, long sleeves and use of sunscreen if there is significant sun exposure.   CC: The primary encounter diagnosis was Hypertension, unspecified type. Diagnoses of Long term current use of anticoagulant therapy, Posterior dislocation of right lens, Advance directive discussed with patient, and Recurrent falls while walking were also pertinent to this visit.  [name] has a past medical history of Atrial fibrillation (HCC), Colon polyp (2008), Coronary artery disease, DVT (deep venous thrombosis) (HCC) (May 2007), History of DVT of lower extremity (May 2007), Hyperlipidemia, Hypertension, Polyp, stomach, Sensorineural hearing loss (2006), and Stroke [facility](HCC).   She has a past surgical history that includes LASER SURGERY FOR VARICOSE VEINS; Nasal polyp surgery (2008); Breast surgery (1972); Abdominal hysterectomy (1965); Appendectomy (1950); Tonsillectomy (1943); Laminectomy (1975); and Thyroid surgery (1977).  Her family history includes Cancer in her other; Heart failure in her brother and mother.She reports that she has never smoked. She has never used smokeless tobacco. She reports that she does not drink alcohol or use drugs.  Outpatient Medications Prior to Visit  Medication Sig Dispense Refill  . acetaminophen (TYLENOL) 500 MG tablet Take 500 mg by mouth every 6 (six) hours as needed for pain.    Marland Kitchen apixaban (ELIQUIS) 2.5 MG TABS tablet TAKE 1 TABLET(2.5 MG) BY MOUTH TWICE DAILY 180 tablet 3  . AZOPT 1 % ophthalmic suspension  SHAKE LQ AND INT 1 GTT IN OD TID  6  . benzonatate (TESSALON) 100 MG capsule Take 1 capsule (100 mg total) by mouth 3 (three) times daily. 20 capsule 0  . calcium carbonate (OS-CAL) 600 MG TABS Take 600 mg by mouth daily.      . carboxymethylcellulose (REFRESH PLUS) 0.5 % SOLN 1 drop 3 (three) times daily as needed.    . cholecalciferol (VITAMIN D) 1000 units tablet Take 1,000 Units by mouth daily.    . Cyanocobalamin (VITAMIN B 12) 100 MCG LOZG Take by mouth daily.    . indapamide (LOZOL) 2.5 MG tablet Take 1 tablet (2.5 mg total) by mouth every morning. 90 tablet 3  . ipratropium (ATROVENT) 0.06 % nasal spray Place 2 sprays into the nose daily.    . isosorbide mononitrate (IMDUR) 60 MG 24 hr tablet Take 1 tablet (60 mg total) by mouth daily. 90 tablet 3  . lisinopril (PRINIVIL,ZESTRIL) 20 MG tablet TAKE 1 TABLET(20 MG) BY MOUTH DAILY 90 tablet 1  . nitroGLYCERIN (NITROSTAT) 0.4 MG SL tablet Place 1 tablet (0.4 mg total) under the tongue every 5 (five) minutes as needed. 10 tablet 0  . potassium chloride (K-DUR) 10 MEQ tablet Take 1 tablet (10 mEq total) by mouth 2 (two) times daily. 180 tablet 1  . potassium chloride (K-DUR) 10 MEQ tablet TAKE 1 TABLET BY MOUTH TWICE DAILY (Patient not taking: Reported on 04/06/2018) 60 tablet 2   No facility-administered medications prior to visit.     Review of Systems   Patient denies headache, fevers, malaise, unintentional weight loss, skin rash, eye pain, sinus congestion and sinus pain, sore throat, dysphagia,  hemoptysis , cough, dyspnea, wheezing, chest pain, palpitations, orthopnea, edema, abdominal pain, nausea, melena, diarrhea, constipation, flank pain, dysuria, hematuria, urinary  Frequency, nocturia, numbness, tingling, seizures,  Focal weakness, Loss of consciousness,  Tremor, insomnia, depression, anxiety, and suicidal ideation.      Objective:  BP 124/72 (BP Location: Left Arm, Patient Position: Sitting, Cuff Size: Normal)   Pulse 73    Temp 97.6 F (36.4 C) (Oral)   Resp 15   Ht 5\' 6"  (1.676 m)   Wt 112 lb 12.8 oz (51.2 kg)   SpO2 99%   BMI 18.21 kg/m   Physical Exam   General appearance: alert, cooperative and appears stated age Ears: normal TM's and external ear canals both ears Throat: lips, mucosa, and tongue normal; teeth and gums normal Neck: no adenopathy, no carotid bruit, supple, symmetrical, trachea midline and thyroid not enlarged, symmetric, no tenderness/mass/nodules Back: symmetric, no curvature. ROM normal. No CVA tenderness. Lungs: clear to auscultation bilaterally Heart: regular rate and rhythm, S1, S2 normal, no murmur, click, rub or gallop Abdomen: soft, non-tender; bowel sounds normal; no masses,  no organomegaly Pulses: 2+ and symmetric Skin: Skin color, texture, turgor normal. No rashes or lesions Lymph nodes: Cervical, supraclavicular, and axillary nodes normal. Neuro:  awake and interactive with normal mood and affect. Higher cortical functions are normal. Speech is clear without word-finding difficulty or dysarthria. Extraocular movements are intact. Visual fields of left eye  Is  grossly intact. Sensation to light touch is grossly intact bilaterally of upper and lower extremities. Motor examination shows 4+/5 symmetric hand grip and upper extremity and 5/5 lower extremity strength. There is no pronation or drift. Gait is non-ataxic      Assessment & Plan:   Problem List Items Addressed This Visit    Posterior dislocation of IOL (intraocular lens)    Occurred during recent fall.  100% loss of vision in right eye.  Patient was previously IADL with mild cognitive changes..  Home health evaluation with PT eval for safety/mobility      Relevant Orders   Ambulatory referral to Home Health   Long term current use of anticoagulant therapy    Taking eliquis  Due to difficulty regulating coumadin dose. hgb normal   Lab Results  Component Value Date   WBC 6.2 04/06/2018   HGB 11.4 (L)  04/06/2018   HCT 33.8 (L) 04/06/2018   MCV 97.2 04/06/2018   PLT 165.0 04/06/2018         Relevant Orders   CBC with Differential/Platelet (Completed)   Hypertension - Primary    Well controlled on current regimen. Renal function stable, no changes today.  Lab Results  Component Value Date   CREATININE 1.16 04/06/2018   Lab Results  Component Value Date   NA 139 04/06/2018   K 3.6 04/06/2018   CL 102 04/06/2018   CO2 32 04/06/2018         Relevant Orders   Comprehensive metabolic panel (Completed)   Advance directive discussed with patient     During the course of the visit the patient was asked about advanced directives and end of life issues.  She prefers to remain FULL CODE DESPITE HER AGE   And despite discussion of the physical risk of harm that could occur with CPR  .         Other Visit Diagnoses    Recurrent falls while walking       Relevant Orders   Ambulatory referral to Home Health    A total of 40 minutes was spent with patient more than half of which was spent in counseling patient on the above mentioned issues , reviewing and explaining recent labs and imaging studies done, and coordination of care.   I am having Latoya Commander maintain her calcium carbonate, ipratropium, carboxymethylcellulose, acetaminophen, nitroGLYCERIN, cholecalciferol, Vitamin B 12, benzonatate, apixaban, isosorbide mononitrate, indapamide, lisinopril, potassium chloride, and AZOPT.  No orders of the defined types were placed in this encounter.   Medications Discontinued During This Encounter  Medication Reason  . potassium chloride (K-DUR) 10 MEQ tablet Duplicate    Follow-up: Return in about 6 months (around 10/06/2018) for hypertension   dementia .   Sherlene Shams, MD

## 2018-04-07 LAB — CBC WITH DIFFERENTIAL/PLATELET
BASOS ABS: 0.1 10*3/uL (ref 0.0–0.1)
BASOS PCT: 1.7 % (ref 0.0–3.0)
EOS ABS: 0.1 10*3/uL (ref 0.0–0.7)
Eosinophils Relative: 1 % (ref 0.0–5.0)
HCT: 33.8 % — ABNORMAL LOW (ref 36.0–46.0)
Hemoglobin: 11.4 g/dL — ABNORMAL LOW (ref 12.0–15.0)
LYMPHS ABS: 2.2 10*3/uL (ref 0.7–4.0)
Lymphocytes Relative: 35.7 % (ref 12.0–46.0)
MCHC: 33.8 g/dL (ref 30.0–36.0)
MCV: 97.2 fl (ref 78.0–100.0)
Monocytes Absolute: 0.4 10*3/uL (ref 0.1–1.0)
Monocytes Relative: 6.6 % (ref 3.0–12.0)
NEUTROS ABS: 3.4 10*3/uL (ref 1.4–7.7)
NEUTROS PCT: 55 % (ref 43.0–77.0)
PLATELETS: 165 10*3/uL (ref 150.0–400.0)
RBC: 3.48 Mil/uL — ABNORMAL LOW (ref 3.87–5.11)
RDW: 12.8 % (ref 11.5–15.5)
WBC: 6.2 10*3/uL (ref 4.0–10.5)

## 2018-04-07 LAB — COMPREHENSIVE METABOLIC PANEL
ALT: 16 U/L (ref 0–35)
AST: 27 U/L (ref 0–37)
Albumin: 4.1 g/dL (ref 3.5–5.2)
Alkaline Phosphatase: 41 U/L (ref 39–117)
BILIRUBIN TOTAL: 0.6 mg/dL (ref 0.2–1.2)
BUN: 22 mg/dL (ref 6–23)
CHLORIDE: 102 meq/L (ref 96–112)
CO2: 32 meq/L (ref 19–32)
CREATININE: 1.16 mg/dL (ref 0.40–1.20)
Calcium: 10 mg/dL (ref 8.4–10.5)
GFR: 46.04 mL/min — ABNORMAL LOW (ref >60.00)
GLUCOSE: 99 mg/dL (ref 70–99)
Potassium: 3.6 mEq/L (ref 3.5–5.1)
Sodium: 139 mEq/L (ref 135–145)
Total Protein: 7.1 g/dL (ref 6.0–8.3)

## 2018-04-08 ENCOUNTER — Ambulatory Visit: Payer: Medicare Other | Admitting: Internal Medicine

## 2018-04-08 DIAGNOSIS — T8522XA Displacement of intraocular lens, initial encounter: Secondary | ICD-10-CM | POA: Insufficient documentation

## 2018-04-08 DIAGNOSIS — H27139 Posterior dislocation of lens, unspecified eye: Secondary | ICD-10-CM | POA: Insufficient documentation

## 2018-04-08 NOTE — Assessment & Plan Note (Signed)
Occurred during recent fall.  100% loss of vision in right eye.  Patient was previously IADL with mild cognitive changes..  Home health evaluation with PT eval for safety/mobility

## 2018-04-08 NOTE — Assessment & Plan Note (Signed)
Well controlled on current regimen. Renal function stable, no changes today.  Lab Results  Component Value Date   CREATININE 1.16 04/06/2018   Lab Results  Component Value Date   NA 139 04/06/2018   K 3.6 04/06/2018   CL 102 04/06/2018   CO2 32 04/06/2018

## 2018-04-08 NOTE — Assessment & Plan Note (Signed)
Taking eliquis  Due to difficulty regulating coumadin dose. hgb normal   Lab Results  Component Value Date   WBC 6.2 04/06/2018   HGB 11.4 (L) 04/06/2018   HCT 33.8 (L) 04/06/2018   MCV 97.2 04/06/2018   PLT 165.0 04/06/2018

## 2018-04-08 NOTE — Assessment & Plan Note (Signed)
During the course of the visit the patient was asked about advanced directives and end of life issues.  She prefers to remain FULL CODE DESPITE HER AGE   And despite discussion of the physical risk of harm that could occur with CPR  .

## 2018-04-14 ENCOUNTER — Telehealth: Payer: Self-pay | Admitting: Internal Medicine

## 2018-04-14 NOTE — Telephone Encounter (Signed)
Latoya Merritt was referred to [facility]. Weston Brassick called and said that her son is questioning the need for home health and needs some clarification as to why she is in need of this. Can you please call him to let him know why and please let me know once he has been called so I can let Weston Brassick know. Thanks!  Thayer OhmChris (the only number that I see on one of her DPR's for him) [phone]

## 2018-04-14 NOTE — Telephone Encounter (Signed)
Spoke with pt's son and informed him of why Dr. Darrick Huntsmanullo had requested Home Health and PT evaluation for the pt based off of her last office note. After explaining what all home health and the PT evaluation would in tell the son felt better about everything and stated that he would give [facility] a call back to day to see if they could get an appt scheduled for them to come out.

## 2018-04-19 ENCOUNTER — Other Ambulatory Visit: Payer: Self-pay | Admitting: Cardiovascular Disease

## 2018-04-25 ENCOUNTER — Telehealth: Payer: Self-pay | Admitting: Internal Medicine

## 2018-04-25 NOTE — Telephone Encounter (Signed)
Copied from CRM [id number]. Topic: Quick Communication - Rx Refill/Question >> Apr 25, 2018  2:13 PM Gerrianne Scale wrote: Medication: lisinopril (PRINIVIL,ZESTRIL) 20 MG tablet      pt son calling stating that his mother need some pills until her medicine come to the home on Friday pt is out of medicine Has the patient contacted their pharmacy? yes (Agent: If no, request that the patient contact the pharmacy for the refill.) Preferred Pharmacy (with phone number or street name): [id number] - [place], [place] - [address] AT [facility] [address] [phone] (Phone) [phone] (Fax)     Agent: Please be advised that RX refills may take up to 3 business days. We ask that you follow-up with your pharmacy.

## 2018-04-26 ENCOUNTER — Other Ambulatory Visit: Payer: Self-pay

## 2018-04-26 MED ORDER — LISINOPRIL 20 MG PO TABS: ORAL_TABLET | ORAL | 0 refills | Status: DC

## 2018-04-26 NOTE — Telephone Encounter (Signed)
Noted that Lisinopril 20 mg, #14, sent in to [facility] in [place], [place] today.

## 2018-04-27 ENCOUNTER — Telehealth: Payer: Self-pay | Admitting: Internal Medicine

## 2018-04-27 NOTE — Telephone Encounter (Signed)
Copied from CRM [id number]. Topic: Quick Communication - See Telephone Encounter >> Apr 27, 2018  1:20 PM Oneal Grout wrote: CRM for notification. See Telephone encounter for: 04/27/18. FYI Family is requesting to place PT on hold for about 2-3 weeks.

## 2018-04-27 NOTE — Telephone Encounter (Signed)
FYI

## 2018-04-27 NOTE — Telephone Encounter (Signed)
Please advise 

## 2018-05-11 ENCOUNTER — Telehealth: Payer: Self-pay | Admitting: Internal Medicine

## 2018-05-11 NOTE — Telephone Encounter (Signed)
FYI

## 2018-05-11 NOTE — Telephone Encounter (Signed)
Please advise 

## 2018-05-11 NOTE — Telephone Encounter (Unsigned)
Copied from CRM [id number]. Topic: Quick Communication - See Telephone Encounter >> May 11, 2018  3:24 PM Raquel Sarna wrote: Romeo Apple PT- [facility] [phone]  Pt's son wanted to put PT on hold. Ben - PT cannot reach son or pt.  The pt has been dropped due to non compliance.

## 2018-05-11 NOTE — Telephone Encounter (Signed)
NTD.  If son wants to defer PT, ok by me

## 2018-07-07 ENCOUNTER — Encounter: Payer: Self-pay | Admitting: Cardiovascular Disease

## 2018-07-07 ENCOUNTER — Encounter

## 2018-07-07 ENCOUNTER — Ambulatory Visit: Payer: Medicare Other | Admitting: Cardiovascular Disease

## 2018-07-07 VITALS — BP 138/74 | HR 63 | Ht 66.0 in | Wt 107.5 lb

## 2018-07-07 DIAGNOSIS — I482 Chronic atrial fibrillation, unspecified: Secondary | ICD-10-CM

## 2018-07-07 DIAGNOSIS — I10 Essential (primary) hypertension: Secondary | ICD-10-CM

## 2018-07-07 NOTE — Patient Instructions (Signed)
Medication Instructions: Continue same medications.   Labwork: None.   Procedures/Testing: None.   Follow-Up: 6 months with Dr. Arida.   Any Additional Special Instructions Will Be Listed Below (If Applicable).     If you need a refill on your cardiac medications before your next appointment, please call your pharmacy.   

## 2018-07-07 NOTE — Progress Notes (Signed)
Cardiology Office Note   Date:  07/07/2018   ID:  Latoya Commanderellie G Lenn, DOB 03/29/1920, MRN [id number]  PCP:  Sherlene Shamsullo, Teresa L, MD  Cardiologist:   Lorine BearsMuhammad Anisa Leanos, MD   Chief Complaint  Patient presents with  . other    9 month f/u no complaints today. Meds reviewed verbally with pt.      History of Present Illness: Latoya Merritt is a 84 y.o. female who presents for a follow-up visit regarding chronic atrial fibrillation . Other medical problems include hyperlipidemia , history of DVT, hypertension, bilateral mastectomy and previous stroke in 2013 while she was temporarily off anticoagulation. She did have a previous non-ST elevation MI with peak troponin 2.5 felt secondary to demand ischemia. No cardiac workup was performed apart from echocardiogram. Echocardiogram showed normal LV systolic function. She is on long-term anticoagulation with Eliquis.  She had a mechanical fall in December 2018.  She developed right hip hematoma due to that.  CT head was negative for bleed.  She did not have syncope.  No falls since then. She has been doing reasonably well with no chest pain, shortness of breath or palpitations.  Past Medical History:  Diagnosis Date  . Atrial fibrillation (HCC)   . Colon polyp 2008  . Coronary artery disease   . DVT (deep venous thrombosis) [facility](HCC) May 2007  . History of DVT of lower extremity May 2007  . Hyperlipidemia   . Hypertension   . Polyp, stomach   . Sensorineural hearing loss 2006   sudden, left ear  . Stroke [facility](HCC)     Past Surgical History:  Procedure Laterality Date  . ABDOMINAL HYSTERECTOMY  1965  . APPENDECTOMY  1950  . BREAST SURGERY  1972   mastectomies, presumed due to breast CA  . LAMINECTOMY  1975  . LASER SURGERY FOR VARICOSE VEINS    . NASAL POLYP SURGERY  2008   Dr. Jenne CampusMcQueen, has been horase ever since  . THYROID SURGERY  1977   nodule removed  . TONSILLECTOMY  1943     Current Outpatient Medications  Medication Sig Dispense  Refill  . acetaminophen (TYLENOL) 500 MG tablet Take 500 mg by mouth every 6 (six) hours as needed for pain.    Marland Kitchen. apixaban (ELIQUIS) 2.5 MG TABS tablet TAKE 1 TABLET(2.5 MG) BY MOUTH TWICE DAILY 180 tablet 3  . AZOPT 1 % ophthalmic suspension SHAKE LQ AND INT 1 GTT IN OD TID  6  . benzonatate (TESSALON) 100 MG capsule Take 1 capsule (100 mg total) by mouth 3 (three) times daily. 20 capsule 0  . calcium carbonate (OS-CAL) 600 MG TABS Take 600 mg by mouth daily.      . carboxymethylcellulose (REFRESH PLUS) 0.5 % SOLN 1 drop 3 (three) times daily as needed.    . cholecalciferol (VITAMIN D) 1000 units tablet Take 1,000 Units by mouth daily.    . Cyanocobalamin (VITAMIN B 12) 100 MCG LOZG Take by mouth daily.    . indapamide (LOZOL) 2.5 MG tablet Take 1 tablet (2.5 mg total) by mouth every morning. 90 tablet 3  . indapamide (LOZOL) 2.5 MG tablet TAKE 1 TABLET BY MOUTH EVERY MORNING 30 tablet 1  . ipratropium (ATROVENT) 0.06 % nasal spray Place 2 sprays into the nose daily.    . isosorbide mononitrate (IMDUR) 60 MG 24 hr tablet Take 1 tablet (60 mg total) by mouth daily. 90 tablet 3  . lisinopril (PRINIVIL,ZESTRIL) 20 MG tablet TAKE 1 TABLET(20 MG)  BY MOUTH DAILY 14 tablet 0  . nitroGLYCERIN (NITROSTAT) 0.4 MG SL tablet Place 1 tablet (0.4 mg total) under the tongue every 5 (five) minutes as needed. 10 tablet 0  . potassium chloride (K-DUR) 10 MEQ tablet Take 1 tablet (10 mEq total) by mouth 2 (two) times daily. 180 tablet 1   No current facility-administered medications for this visit.     Allergies:   Amoxicillin; Amoxicillin-pot clavulanate; Cephalexin; Cephalexin; Clarithromycin; Clindamycin/lincomycin; Lansoprazole; Lisinopril; Macrodantin; Sulfa antibiotics; and Tussionex pennkinetic er [hydrocod polst-cpm polst er]    Social History:  The patient  reports that she has never smoked. She has never used smokeless tobacco. She reports that she does not drink alcohol or use drugs.   Family  History:  The patient's family history includes Cancer in her other; Heart failure in her brother and mother.    ROS:  Please see the history of present illness.   Otherwise, review of systems are positive for none.   All other systems are reviewed and negative.    PHYSICAL EXAM: VS:  BP 138/74 (BP Location: Left Arm, Patient Position: Sitting, Cuff Size: Normal)   Pulse 63   Ht 5\' 6"  (1.676 m)   Wt 107 lb 8 oz (48.8 kg)   BMI 17.35 kg/m  , BMI Body mass index is 17.35 kg/m. GEN: Well nourished, well developed, in no acute distress  HEENT: normal  Neck: no JVD, carotid bruits, or masses Cardiac: Irregularly irregular; no murmurs, rubs, or gallops,no edema  Respiratory:  clear to auscultation bilaterally, normal work of breathing GI: soft, nontender, nondistended, + BS MS: no deformity or atrophy  Skin: warm and dry, no rash Neuro:  Strength and sensation are intact Psych: euthymic mood, full affect   EKG:  EKG is ordered today. The ekg ordered today demonstrates atrial fibrillation with nonspecific T wave changes   Recent Labs: 04/06/2018: ALT 16; BUN 22; Creatinine, Ser 1.16; Hemoglobin 11.4; Platelets 165.0; Potassium 3.6; Sodium 139    Lipid Panel    Component Value Date/Time   CHOL 197 10/23/2012 0400   TRIG 137 10/23/2012 0400   HDL 61 (H) 10/23/2012 0400   VLDL 27 10/23/2012 0400   LDLCALC 109 (H) 10/23/2012 0400      Wt Readings from Last 3 Encounters:  07/07/18 107 lb 8 oz (48.8 kg)  04/06/18 112 lb 12.8 oz (51.2 kg)  01/06/18 107 lb 4 oz (48.6 kg)         ASSESSMENT AND PLAN:  1.  Chronic atrial fibrillation: Ventricular rate is well controlled without any medications.   Continue long-term anticoagulation with low-dose Eliquis given her age and weight.  I reviewed most recent labs in April which showed stable hemoglobin at 11.4 and creatinine of 1.16. If episodes of falling become more frequent, we might need to consider stopping anticoagulation but  for now the benefits outweigh the risks.  2. Essential hypertension: Blood pressure is  well controlled on current medications  3. Stable class II angina: No recent worsening. Continue medical therapy.    Disposition:   FU with me in 6 months  Signed,  Lorine Bears, MD  07/07/2018 10:02 AM    [facility]

## 2018-08-15 ENCOUNTER — Other Ambulatory Visit: Payer: Self-pay | Admitting: Internal Medicine

## 2018-08-22 ENCOUNTER — Other Ambulatory Visit: Payer: Self-pay | Admitting: Internal Medicine

## 2018-10-07 ENCOUNTER — Ambulatory Visit: Payer: Medicare Other | Admitting: Internal Medicine

## 2018-11-08 ENCOUNTER — Other Ambulatory Visit: Payer: Self-pay | Admitting: Internal Medicine

## 2018-11-11 ENCOUNTER — Ambulatory Visit: Payer: Medicare Other | Admitting: Internal Medicine

## 2019-01-27 ENCOUNTER — Other Ambulatory Visit: Payer: Self-pay | Admitting: Cardiovascular Disease

## 2019-01-30 NOTE — Telephone Encounter (Signed)
Please review for refill, Thanks !  

## 2019-02-02 ENCOUNTER — Other Ambulatory Visit: Payer: Self-pay | Admitting: Internal Medicine

## 2019-02-02 ENCOUNTER — Other Ambulatory Visit: Payer: Self-pay | Admitting: Cardiovascular Disease

## 2019-03-15 ENCOUNTER — Other Ambulatory Visit: Payer: Self-pay | Admitting: Cardiovascular Disease

## 2019-03-15 ENCOUNTER — Other Ambulatory Visit: Payer: Self-pay | Admitting: Internal Medicine

## 2019-03-16 NOTE — Telephone Encounter (Signed)
Please review for refill. Thanks!  

## 2019-03-16 NOTE — Telephone Encounter (Signed)
Scr = 1.16 on 04-06-18  Last OV with Dr Kirke Corin 07-07-18 (ovedue for 6 mo follow up)

## 2019-03-17 MED ORDER — TELMISARTAN 40 MG PO TABS: ORAL_TABLET | ORAL | 1 refills | Status: DC

## 2019-03-17 NOTE — Telephone Encounter (Signed)
Please let Latoya Merritt Has know that   I am making a decision to change your lisinopril to telmisartan, based on increased reports  of patients  developing tongue and throat swelling from lisinopril.  The condition , called "angioedema," can be fatal if a person's airway is compromised.  I also want you to take it at night instead of morning,  s recent studies have shown that taking your blood pressure medications at night protects you better from heart attacks and strokes.

## 2019-03-29 NOTE — Telephone Encounter (Signed)
Spoke with pt's son to let him know that Dr. Darrick Huntsman has changed the pt's blood pressure medication to Telmisartan and the reasoning behind why she changed the medication. The pt's son gave a verbal understanding.

## 2019-05-30 ENCOUNTER — Telehealth: Payer: Self-pay

## 2019-05-30 NOTE — Telephone Encounter (Signed)

## 2019-06-01 ENCOUNTER — Telehealth (INDEPENDENT_AMBULATORY_CARE_PROVIDER_SITE_OTHER): Payer: Medicare Other | Admitting: Cardiovascular Disease

## 2019-06-01 ENCOUNTER — Encounter: Payer: Self-pay | Admitting: Cardiovascular Disease

## 2019-06-01 ENCOUNTER — Other Ambulatory Visit: Payer: Self-pay

## 2019-06-01 VITALS — Ht 66.0 in | Wt 110.0 lb

## 2019-06-01 DIAGNOSIS — I482 Chronic atrial fibrillation, unspecified: Secondary | ICD-10-CM

## 2019-06-01 NOTE — Patient Instructions (Signed)
Medication Instructions:  Continue same medications If you need a refill on your cardiac medications before your next appointment, please call your pharmacy.   Lab work: None If you have labs (blood work) drawn today and your tests are completely normal, you will receive your results only by: . MyChart Message (if you have MyChart) OR . A paper copy in the mail If you have any lab test that is abnormal or we need to change your treatment, we will call you to review the results.  Testing/Procedures: None  Follow-Up: At [facility], you and your health needs are our priority.  As part of our continuing mission to provide you with exceptional heart care, we have created designated Provider Care Teams.  These Care Teams include your primary Cardiologist (physician) and Advanced Practice Providers (APPs -  Physician Assistants and Nurse Practitioners) who all work together to provide you with the care you need, when you need it. You will need a follow up appointment in 6 months.  Please call our office 2 months in advance to schedule this appointment.  You may see Mccormick Macon, MD or one of the following Advanced Practice Providers on your designated Care Team:   Christopher Berge, NP Ryan Dunn, PA-C . Jacquelyn Visser, PA-C   

## 2019-06-01 NOTE — Progress Notes (Signed)
Virtual Visit via Telephone Note   This visit type was conducted due to national recommendations for restrictions regarding the COVID-19 Pandemic (e.g. social distancing) in an effort to limit this patient's exposure and mitigate transmission in our community.  Due to her co-morbid illnesses, this patient is at least at moderate risk for complications without adequate follow up.  This format is felt to be most appropriate for this patient at this time.  The patient did not have access to video technology/had technical difficulties with video requiring transitioning to audio format only (telephone).  All issues noted in this document were discussed and addressed.  No physical exam could be performed with this format.  Please refer to the patient's chart for her  consent to telehealth [facility].   Date:  06/01/2019   ID:  Latoya CommanderNellie G Merritt, DOB 05/09/1920, MRN [id number]  Patient Location: Home Provider Location: Office  PCP:  Sherlene Shamsullo, Teresa L, MD  Cardiologist:  Lorine BearsMuhammad Emy Angevine, MD  Electrophysiologist:  None   Evaluation Performed:  Follow-Up Visit  Chief Complaint: Fatigue  History of Present Illness:    Latoya Commanderellie G Mcclarty is a 85 y.o. female was reached via phone for follow-up regarding chronic atrial fibrillation. Other medical problems include hyperlipidemia , history of DVT, hypertension, bilateral mastectomy and previous stroke in 2013 while she was temporarily off anticoagulation. She did have a previous non-ST elevation MI with peak troponin 2.5 felt secondary to demand ischemia. No cardiac workup was performed apart from echocardiogram. Echocardiogram showed normal LV systolic function. She is on long-term anticoagulation with Eliquis.  She had a mechanical fall in December 2018.  She developed right hip hematoma due to that.  CT head was negative for bleed.   She has been doing well with no recent chest pain, shortness of breath or palpitations.  No recurrent falls.  The  patient does not have symptoms concerning for COVID-19 infection (fever, chills, cough, or new shortness of breath).    Past Medical History:  Diagnosis Date  . Atrial fibrillation (HCC)   . Colon polyp 2008  . Coronary artery disease   . DVT (deep venous thrombosis) [facility](HCC) May 2007  . History of DVT of lower extremity May 2007  . Hyperlipidemia   . Hypertension   . Polyp, stomach   . Sensorineural hearing loss 2006   sudden, left ear  . Stroke [facility](HCC)    Past Surgical History:  Procedure Laterality Date  . ABDOMINAL HYSTERECTOMY  1965  . APPENDECTOMY  1950  . BREAST SURGERY  1972   mastectomies, presumed due to breast CA  . LAMINECTOMY  1975  . LASER SURGERY FOR VARICOSE VEINS    . NASAL POLYP SURGERY  2008   Dr. Jenne CampusMcQueen, has been horase ever since  . THYROID SURGERY  1977   nodule removed  . TONSILLECTOMY  1943     Current Meds  Medication Sig  . acetaminophen (TYLENOL) 500 MG tablet Take 500 mg by mouth every 6 (six) hours as needed for pain.  . AZOPT 1 % ophthalmic suspension SHAKE LQ AND INT 1 GTT IN OD TID  . benzonatate (TESSALON) 100 MG capsule Take 1 capsule (100 mg total) by mouth 3 (three) times daily.  . calcium carbonate (OS-CAL) 600 MG TABS Take 600 mg by mouth daily.    . carboxymethylcellulose (REFRESH PLUS) 0.5 % SOLN 1 drop 3 (three) times daily as needed.  . cholecalciferol (VITAMIN D) 1000 units tablet Take 1,000 Units by mouth daily.  .Marland Kitchen  Cyanocobalamin (VITAMIN B 12) 100 MCG LOZG Take by mouth daily.  Marland Kitchen. ELIQUIS 2.5 MG TABS tablet TAKE 1 TABLET BY MOUTH  TWICE A DAY  . indapamide (LOZOL) 2.5 MG tablet TAKE 1 TABLET BY MOUTH EVERY MORNING  . ipratropium (ATROVENT) 0.06 % nasal spray Place 2 sprays into the nose daily.  . isosorbide mononitrate (IMDUR) 60 MG 24 hr tablet TAKE 1 TABLET BY MOUTH  DAILY  . nitroGLYCERIN (NITROSTAT) 0.4 MG SL tablet Place 1 tablet (0.4 mg total) under the tongue every 5 (five) minutes as needed.  . potassium chloride (K-DUR) 10  MEQ tablet Take 1 tablet (10 mEq total) by mouth 2 (two) times daily.  Marland Kitchen. telmisartan (MICARDIS) 40 MG tablet REPLACES LISINOPRIL     Allergies:   Amoxicillin, Amoxicillin-pot clavulanate, Cephalexin, Cephalexin, Clarithromycin, Clindamycin/lincomycin, Lansoprazole, Lisinopril, Macrodantin, Sulfa antibiotics, and Tussionex pennkinetic er [hydrocod polst-cpm polst er]   Social History   Tobacco Use  . Smoking status: Never Smoker  . Smokeless tobacco: Never Used  Substance Use Topics  . Alcohol use: No    Alcohol/week: 7.0 standard drinks    Types: 7 Glasses of wine per week    Comment: glass a wine at bedtime  . Drug use: No     Family Hx: The patient's family history includes Cancer in an other family member; Heart failure in her brother and mother.  ROS:   Please see the history of present illness.     All other systems reviewed and are negative.   Prior CV studies:   The following studies were reviewed today:    Labs/Other Tests and Data Reviewed:    EKG:  No ECG reviewed.  Recent Labs: No results found for requested labs within last 8760 hours.   Recent Lipid Panel Lab Results  Component Value Date/Time   CHOL 197 10/23/2012 04:00 AM   TRIG 137 10/23/2012 04:00 AM   HDL 61 (H) 10/23/2012 04:00 AM   LDLCALC 109 (H) 10/23/2012 04:00 AM    Wt Readings from Last 3 Encounters:  06/01/19 110 lb (49.9 kg)  07/07/18 107 lb 8 oz (48.8 kg)  04/06/18 112 lb 12.8 oz (51.2 kg)     Objective:    Vital Signs:  Ht 5\' 6"  (1.676 m)   Wt 110 lb (49.9 kg)   BMI 17.75 kg/m    VITAL SIGNS:  reviewed  ASSESSMENT & PLAN:    1.  Chronic atrial fibrillation: She seems to be doing well with no symptoms of palpitations or shortness of breath.  Ventricular rate has been historically controlled without medications.  She is tolerating anticoagulation with low-dose Eliquis.  No recurrent falls.  2. Essential hypertension: Continue current medications.  She could not check her  blood pressure today.  3.  Possible coronary artery disease: Currently with no anginal symptoms.  COVID-19 Education: The signs and symptoms of COVID-19 were discussed with the patient and how to seek care for testing (follow up with PCP or arrange E-visit).  The importance of social distancing was discussed today.  Time:   Today, I have spent 12 minutes with the patient with telehealth technology discussing the above problems.     Medication Adjustments/Labs and Tests Ordered: Current medicines are reviewed at length with the patient today.  Concerns regarding medicines are outlined above.   Tests Ordered: No orders of the defined types were placed in this encounter.   Medication Changes: No orders of the defined types were placed in this encounter.  Disposition:  Follow up in 6 month(s)  Signed, Kathlyn Sacramento, MD  06/01/2019 1:42 PM    [facility]

## 2019-06-29 ENCOUNTER — Other Ambulatory Visit: Payer: Self-pay | Admitting: Internal Medicine

## 2019-06-29 ENCOUNTER — Other Ambulatory Visit: Payer: Self-pay | Admitting: Cardiovascular Disease

## 2019-06-30 NOTE — Telephone Encounter (Signed)
Please review for refill, Thanks !  

## 2019-06-30 NOTE — Telephone Encounter (Signed)
Looks like medication was discontinued on 06/01/2019.

## 2019-06-30 NOTE — Telephone Encounter (Signed)
Age 85, weight 49.9kg, SCr 1.16 on 04/06/18, last OV 05/2019, afib indication. Last labs a little over 61 year old, however will not make pt come in for labs due to [place] and advanced age, she is on correct dose based on age and weight.

## 2019-07-23 ENCOUNTER — Other Ambulatory Visit: Payer: Self-pay | Admitting: Internal Medicine

## 2019-08-08 ENCOUNTER — Other Ambulatory Visit: Payer: Self-pay | Admitting: Internal Medicine

## 2019-08-23 ENCOUNTER — Other Ambulatory Visit: Payer: Self-pay | Admitting: Internal Medicine

## 2019-08-24 NOTE — Telephone Encounter (Signed)
Refilled: 03/17/2019 Last OV: 04/07/2019 Next OV: not scheduled

## 2019-08-29 ENCOUNTER — Telehealth: Payer: Self-pay | Admitting: *Deleted

## 2019-08-29 NOTE — Telephone Encounter (Signed)
Copied from [place] [phone]. Topic: General - Other >> Aug 29, 2019  9:31 AM Leward Quan A wrote: Reason for CRM: Patient son Randel Books called to say that he received some news that his mom the patient may be developing cellulitis in her foot and he need to know from Dr Derrel Nip how to go about getting this checked. Gerald Stabs is asking for a call back at Ph#  [phone] he was informed that patient have not seen Dr Derrel Nip since 04/06/18. Please advise

## 2019-08-29 NOTE — Telephone Encounter (Signed)
Patient son did not know which foot that the redness is in just he was notified  worse today than yesterday. Advised patient son with so little information, not knowing how bad the infection might be that she would need to be evaluated today, advised patient son that cellulitis can advance pretty fast if not taken care of in a timely manner, that patient with no appointments available in office should go to UC or ER to be evaluated today .  Patient son stated he have neighbor take her to [place] [facility] [place]. Tried to call patient could not get patient on phone.

## 2019-10-10 ENCOUNTER — Other Ambulatory Visit: Payer: Self-pay | Admitting: Cardiovascular Disease

## 2019-12-01 ENCOUNTER — Telehealth: Payer: Self-pay | Admitting: *Deleted

## 2019-12-01 NOTE — Telephone Encounter (Signed)
Copied from [place] [phone]. Topic: General - Other >> Dec 01, 2019 11:54 AM Carolyn Stare wrote: Pt son Gerald Stabs call and wanted to ask Dr Derrel Nip if pt is due for any type of fup

## 2019-12-01 NOTE — Telephone Encounter (Signed)
LMTCB with pt's son, Gerald Stabs. Need to let him know that the pt is due for a follow up because she has not been seen since 03/2018. Pt needs to be scheduled for a virtual follow up with Dr. Derrel Nip.

## 2019-12-28 ENCOUNTER — Encounter: Payer: Self-pay | Admitting: Cardiovascular Disease

## 2019-12-28 ENCOUNTER — Telehealth (INDEPENDENT_AMBULATORY_CARE_PROVIDER_SITE_OTHER): Payer: Medicare Other | Admitting: Cardiovascular Disease

## 2019-12-28 ENCOUNTER — Other Ambulatory Visit: Payer: Self-pay

## 2019-12-28 VITALS — Ht 67.0 in | Wt 115.0 lb

## 2019-12-28 DIAGNOSIS — I482 Chronic atrial fibrillation, unspecified: Secondary | ICD-10-CM

## 2019-12-28 DIAGNOSIS — I10 Essential (primary) hypertension: Secondary | ICD-10-CM

## 2019-12-28 NOTE — Progress Notes (Signed)
Virtual Visit via Telephone Note   This visit type was conducted due to national recommendations for restrictions regarding the COVID-19 Pandemic (e.g. social distancing) in an effort to limit this patient's exposure and mitigate transmission in our community.  Due to her co-morbid illnesses, this patient is at least at moderate risk for complications without adequate follow up.  This format is felt to be most appropriate for this patient at this time.  The patient did not have access to video technology/had technical difficulties with video requiring transitioning to audio format only (telephone).  All issues noted in this document were discussed and addressed.  No physical exam could be performed with this format.  Please refer to the patient's chart for her  consent to telehealth for [facility].   Date:  12/28/2019   ID:  Latoya Merritt, DOB Feb 06, 1920, MRN [id number]  Patient Location: Home Provider Location: Office  PCP:  Sherlene Shams, MD  Cardiologist:  Lorine Bears, MD  Electrophysiologist:  None   Evaluation Performed:  Follow-Up Visit  Chief Complaint: Fatigue  History of Present Illness:    Latoya Merritt is a 86 y.o. female was reached via phone for follow-up regarding chronic atrial fibrillation. Other medical problems include hyperlipidemia , history of DVT, hypertension, bilateral mastectomy and previous stroke in 2013 while she was temporarily off anticoagulation. She did have a previous non-ST elevation MI with peak troponin 2.5 felt secondary to demand ischemia. No cardiac workup was performed apart from echocardiogram. Echocardiogram showed normal LV systolic function. She is on long-term anticoagulation with Eliquis.  She had a mechanical fall in December 2018.  She developed right hip hematoma due to that.  CT head was negative for bleed.   She has been doing well with no recent chest pain, shortness of breath or palpitations.  No recurrent falls.  The  patient does not have symptoms concerning for COVID-19 infection (fever, chills, cough, or new shortness of breath).    Past Medical History:  Diagnosis Date  . Atrial fibrillation (HCC)   . Colon polyp 2008  . Coronary artery disease   . DVT (deep venous thrombosis) [facility]) May 2007  . History of DVT of lower extremity May 2007  . Hyperlipidemia   . Hypertension   . Polyp, stomach   . Sensorineural hearing loss 2006   sudden, left ear  . Stroke [facility])    Past Surgical History:  Procedure Laterality Date  . ABDOMINAL HYSTERECTOMY  1965  . APPENDECTOMY  1950  . BREAST SURGERY  1972   mastectomies, presumed due to breast CA  . LAMINECTOMY  1975  . LASER SURGERY FOR VARICOSE VEINS    . NASAL POLYP SURGERY  2008   Dr. Jenne Campus, has been horase ever since  . THYROID SURGERY  1977   nodule removed  . TONSILLECTOMY  1943     Current Meds  Medication Sig  . acetaminophen (TYLENOL) 500 MG tablet Take 500 mg by mouth every 6 (six) hours as needed for pain.  . AZOPT 1 % ophthalmic suspension SHAKE LQ AND INT 1 GTT IN OD TID  . calcium carbonate (OS-CAL) 600 MG TABS Take 600 mg by mouth daily.    . carboxymethylcellulose (REFRESH PLUS) 0.5 % SOLN 1 drop 3 (three) times daily as needed.  . Cyanocobalamin (VITAMIN B 12) 100 MCG LOZG Take by mouth daily.  Marland Kitchen ELIQUIS 2.5 MG TABS tablet TAKE 1 TABLET BY MOUTH  TWICE DAILY  . indapamide (LOZOL) 2.5  MG tablet TAKE 1 TABLET BY MOUTH IN  THE MORNING  . ipratropium (ATROVENT) 0.06 % nasal spray Place 2 sprays into the nose daily.  . isosorbide mononitrate (IMDUR) 60 MG 24 hr tablet TAKE 1 TABLET BY MOUTH  DAILY  . nitroGLYCERIN (NITROSTAT) 0.4 MG SL tablet Place 1 tablet (0.4 mg total) under the tongue every 5 (five) minutes as needed.  Marland Kitchen telmisartan (MICARDIS) 40 MG tablet TAKE 1 TABLET BY MOUTH  DAILY (REPLACES LISINOPRIL)     Allergies:   Amoxicillin-pot clavulanate, Cephalexin, Clarithromycin, Clarithromycin, Clindamycin/lincomycin,  Lansoprazole, Macrodantin, Sulfa antibiotics, Tussionex pennkinetic er [hydrocod polst-cpm polst er], Amoxicillin, Cephalexin, Clindamycin, Hydrocodone-chlorpheniramine, Lansoprazole, Lisinopril, Nitrofurantoin, and Other   Social History   Tobacco Use  . Smoking status: Never Smoker  . Smokeless tobacco: Never Used  Substance Use Topics  . Alcohol use: No    Alcohol/week: 7.0 standard drinks    Types: 7 Glasses of wine per week    Comment: glass a wine at bedtime  . Drug use: No     Family Hx: The patient's family history includes Cancer in an other family member; Heart failure in her brother and mother.  ROS:   Please see the history of present illness.     All other systems reviewed and are negative.   Prior CV studies:   The following studies were reviewed today:    Labs/Other Tests and Data Reviewed:    EKG:  No ECG reviewed.  Recent Labs: No results found for requested labs within last 8760 hours.   Recent Lipid Panel Lab Results  Component Value Date/Time   CHOL 197 10/23/2012 04:00 AM   TRIG 137 10/23/2012 04:00 AM   HDL 61 (H) 10/23/2012 04:00 AM   LDLCALC 109 (H) 10/23/2012 04:00 AM    Wt Readings from Last 3 Encounters:  12/28/19 115 lb (52.2 kg)  06/01/19 110 lb (49.9 kg)  07/07/18 107 lb 8 oz (48.8 kg)     Objective:     Vital Signs:  Ht 5\' 7"  (1.702 m)   Wt 115 lb (52.2 kg)   BMI 18.01 kg/m    VITAL SIGNS:  reviewed  ASSESSMENT & PLAN:    1.  Chronic atrial fibrillation: She seems to be doing well with no symptoms of palpitations or shortness of breath.  Ventricular rate has been historically controlled without medications.  She is tolerating anticoagulation with low-dose Eliquis.  No recurrent falls.  2. Essential hypertension: Continue current medications.  She could not check her blood pressure today.  3.  Possible coronary artery disease: Currently with no anginal symptoms.  COVID-19 Education: The signs and symptoms of COVID-19  were discussed with the patient and how to seek care for testing (follow up with PCP or arrange E-visit).  The importance of social distancing was discussed today.  Time:   Today, I have spent 8 minutes with the patient with telehealth technology discussing the above problems.     Medication Adjustments/Labs and Tests Ordered: Current medicines are reviewed at length with the patient today.  Concerns regarding medicines are outlined above.   Tests Ordered: No orders of the defined types were placed in this encounter.   Medication Changes: No orders of the defined types were placed in this encounter.   Disposition:  Follow up in 6 month(s)  Signed, Kathlyn Sacramento, MD  12/28/2019 11:15 AM    [place]

## 2019-12-28 NOTE — Telephone Encounter (Signed)
Spoke with pt's son to let him know that the pt is due for a follow up because she has not been seen since 03/2018. [name] stated that he would speak with his mother and figure out when would be best for her to have a telephone visit with Dr. Darrick Huntsman and then he would call the office back to schedule.

## 2019-12-28 NOTE — Patient Instructions (Signed)
Medication Instructions:  Continue same medications . *If you need a refill on your cardiac medications before your next appointment, please call your pharmacy*  Lab Work: None If you have labs (blood work) drawn today and your tests are completely normal, you will receive your results only by: . MyChart Message (if you have MyChart) OR . A paper copy in the mail If you have any lab test that is abnormal or we need to change your treatment, we will call you to review the results.  Testing/Procedures: None  Follow-Up: At [facility], you and your health needs are our priority.  As part of our continuing mission to provide you with exceptional heart care, we have created designated Provider Care Teams.  These Care Teams include your primary Cardiologist (physician) and Advanced Practice Providers (APPs -  Physician Assistants and Nurse Practitioners) who all work together to provide you with the care you need, when you need it.  Your next appointment:   6 month(s)  The format for your next appointment:   In Person  Provider:    You may see Latoya Dottavio, MD or one of the following Advanced Practice Providers on your designated Care Team:    Christopher Berge, NP  Ryan Dunn, PA-C  Jacquelyn Visser, PA-C    

## 2020-01-01 ENCOUNTER — Other Ambulatory Visit: Payer: Self-pay | Admitting: Cardiovascular Disease

## 2020-01-02 NOTE — Telephone Encounter (Signed)
Refill Request.  

## 2020-05-27 ENCOUNTER — Other Ambulatory Visit: Payer: Self-pay

## 2020-06-06 ENCOUNTER — Encounter: Payer: Self-pay | Admitting: Internal Medicine

## 2020-06-06 ENCOUNTER — Other Ambulatory Visit: Payer: Self-pay

## 2020-06-06 ENCOUNTER — Ambulatory Visit: Payer: Medicare Other | Admitting: Internal Medicine

## 2020-06-06 VITALS — BP 110/68 | HR 68 | Temp 96.9°F | Resp 16 | Ht 67.0 in | Wt 114.4 lb

## 2020-06-06 DIAGNOSIS — I10 Essential (primary) hypertension: Secondary | ICD-10-CM

## 2020-06-06 DIAGNOSIS — T466X5A Adverse effect of antihyperlipidemic and antiarteriosclerotic drugs, initial encounter: Secondary | ICD-10-CM

## 2020-06-06 DIAGNOSIS — D6869 Other thrombophilia: Secondary | ICD-10-CM | POA: Diagnosis not present

## 2020-06-06 DIAGNOSIS — R0609 Other forms of dyspnea: Secondary | ICD-10-CM

## 2020-06-06 DIAGNOSIS — R0600 Dyspnea, unspecified: Secondary | ICD-10-CM | POA: Diagnosis not present

## 2020-06-06 DIAGNOSIS — I4891 Unspecified atrial fibrillation: Secondary | ICD-10-CM | POA: Diagnosis not present

## 2020-06-06 DIAGNOSIS — R5383 Other fatigue: Secondary | ICD-10-CM | POA: Diagnosis not present

## 2020-06-06 DIAGNOSIS — M791 Myalgia, unspecified site: Secondary | ICD-10-CM

## 2020-06-06 DIAGNOSIS — F0150 Vascular dementia without behavioral disturbance: Secondary | ICD-10-CM

## 2020-06-06 LAB — COMPREHENSIVE METABOLIC PANEL
ALT: 12 U/L (ref 0–35)
AST: 22 U/L (ref 0–37)
Albumin: 4.3 g/dL (ref 3.5–5.2)
Alkaline Phosphatase: 43 U/L (ref 39–117)
BUN: 23 mg/dL (ref 6–23)
CO2: 34 mEq/L — ABNORMAL HIGH (ref 19–32)
Calcium: 10 mg/dL (ref 8.4–10.5)
Chloride: 101 mEq/L (ref 96–112)
Creatinine, Ser: 1.28 mg/dL — ABNORMAL HIGH (ref 0.40–1.20)
GFR: 38.49 mL/min — ABNORMAL LOW (ref >60.00)
Glucose, Bld: 92 mg/dL (ref 70–99)
Potassium: 3.7 mEq/L (ref 3.5–5.1)
Sodium: 141 mEq/L (ref 135–145)
Total Bilirubin: 0.9 mg/dL (ref 0.2–1.2)
Total Protein: 7.1 g/dL (ref 6.0–8.3)

## 2020-06-06 LAB — CBC WITH DIFFERENTIAL/PLATELET
Basophils Absolute: 0.1 10*3/uL (ref 0.0–0.1)
Basophils Relative: 0.8 % (ref 0.0–3.0)
Eosinophils Absolute: 0 10*3/uL (ref 0.0–0.7)
Eosinophils Relative: 0.3 % (ref 0.0–5.0)
HCT: 34.7 % — ABNORMAL LOW (ref 36.0–46.0)
Hemoglobin: 11.6 g/dL — ABNORMAL LOW (ref 12.0–15.0)
Lymphocytes Relative: 22.9 % (ref 12.0–46.0)
Lymphs Abs: 1.6 10*3/uL (ref 0.7–4.0)
MCHC: 33.3 g/dL (ref 30.0–36.0)
MCV: 96.2 fl (ref 78.0–100.0)
Monocytes Absolute: 0.4 10*3/uL (ref 0.1–1.0)
Monocytes Relative: 5.8 % (ref 3.0–12.0)
Neutro Abs: 4.9 10*3/uL (ref 1.4–7.7)
Neutrophils Relative %: 70.2 % (ref 43.0–77.0)
Platelets: 171 10*3/uL (ref 150.0–400.0)
RBC: 3.61 Mil/uL — ABNORMAL LOW (ref 3.87–5.11)
RDW: 13.7 % (ref 11.5–15.5)
WBC: 7 10*3/uL (ref 4.0–10.5)

## 2020-06-06 LAB — TSH: TSH: 1.55 u[IU]/mL (ref 0.35–4.50)

## 2020-06-06 LAB — BRAIN NATRIURETIC PEPTIDE: Pro B Natriuretic peptide (BNP): 307 pg/mL — ABNORMAL HIGH (ref 0.0–100.0)

## 2020-06-06 NOTE — Progress Notes (Signed)
Subjective:  Patient ID: Latoya Merritt, female    DOB: 10/05/1920  Age: 86 y.o. MRN: [id number]  CC: The primary encounter diagnosis was Dyspnea on exertion. Diagnoses of Fatigue, unspecified type, Acquired thrombophilia (HCC), Atrial fibrillation, unspecified type [facility]), Essential hypertension, Vascular dementia without behavioral disturbance (HCC), and Myalgia due to statin were also pertinent to this visit.  HPI Latoya Merritt presents for  Follow up on multiple issues  This visit occurred during the SARS-CoV-2 public health emergency.  Safety protocols were in place, including screening questions prior to the visit, additional usage of staff PPE, and extensive cleaning of exam room while observing appropriate contact time as indicated for disinfecting solutions.   1) Hypertension: patient checks blood pressure twice weekly at home.  Readings have been for the most part equal to or less than  140/80 at rest . Patient is following a reduced salt diet most days and is taking medications as prescribed.  2) Vascular dementia: her impairment is mild,  And she is capable of ADLS without assistance.  She continues to live at home with supervision by neighbors .  There has been no reports of wanderings or falls except when she tries to prune her bushes , which resulted in a minor fall with no injuries sustained.    3) She has noticed an unusual spot on her left breast /chest wall .    4) Loss of appetite.    Decreased energy.  Dyspnea and chest tightness for the past 6 months. Wants a chest x ray.  Last seen April 2019   there has been a 2 lb weight gain  Since then    5) Acquired thrombophilia:  She is taking Eliquis for secondary prevention of embolic CVA and has had no bleeding episodes of ER visits for AMS  Outpatient Medications Prior to Visit  Medication Sig Dispense Refill  . acetaminophen (TYLENOL) 500 MG tablet Take 500 mg by mouth every 6 (six) hours as needed for pain.    . AZOPT 1  % ophthalmic suspension SHAKE LQ AND INT 1 GTT IN OD TID  6  . calcium carbonate (OS-CAL) 600 MG TABS Take 600 mg by mouth daily.      . carboxymethylcellulose (REFRESH PLUS) 0.5 % SOLN 1 drop 3 (three) times daily as needed.    . Cyanocobalamin (VITAMIN B 12) 100 MCG LOZG Take by mouth daily.    Marland Kitchen ELIQUIS 2.5 MG TABS tablet TAKE 1 TABLET BY MOUTH  TWICE DAILY 180 tablet 3  . indapamide (LOZOL) 2.5 MG tablet TAKE 1 TABLET BY MOUTH IN  THE MORNING 90 tablet 2  . ipratropium (ATROVENT) 0.06 % nasal spray Place 2 sprays into the nose daily.    . isosorbide mononitrate (IMDUR) 60 MG 24 hr tablet TAKE 1 TABLET BY MOUTH  DAILY 90 tablet 2  . nitroGLYCERIN (NITROSTAT) 0.4 MG SL tablet Place 1 tablet (0.4 mg total) under the tongue every 5 (five) minutes as needed. 10 tablet 0  . telmisartan (MICARDIS) 40 MG tablet TAKE 1 TABLET BY MOUTH  DAILY (REPLACES LISINOPRIL) 90 tablet 3   No facility-administered medications prior to visit.    Review of Systems;  Patient denies headache, fevers, malaise, unintentional weight loss, skin rash, eye pain, sinus congestion and sinus pain, sore throat, dysphagia,  hemoptysis , cough, dyspnea, wheezing, chest pain, palpitations, orthopnea, edema, abdominal pain, nausea, melena, diarrhea, constipation, flank pain, dysuria, hematuria, urinary  Frequency, nocturia, numbness, tingling, seizures,  Focal weakness,  Loss of consciousness,  Tremor, insomnia, depression, anxiety, and suicidal ideation.      Objective:  BP 110/68 (BP Location: Left Arm, Patient Position: Sitting, Cuff Size: Normal)   Pulse 68   Temp (!) 96.9 F (36.1 C) (Temporal)   Resp 16   Ht 5\' 7"  (1.702 m)   Wt 114 lb 6.4 oz (51.9 kg)   SpO2 99%   BMI 17.92 kg/m   BP Readings from Last 3 Encounters:  06/06/20 110/68  07/07/18 138/74  04/06/18 124/72    Wt Readings from Last 3 Encounters:  06/06/20 114 lb 6.4 oz (51.9 kg)  12/28/19 115 lb (52.2 kg)  06/01/19 110 lb (49.9 kg)    General  appearance: alert, cooperative and appears younger than stated age Ears: normal TM's and external ear canals both ears Throat: lips, mucosa, and tongue normal; teeth and gums normal Neck: no adenopathy, no carotid bruit, supple, symmetrical, trachea midline and thyroid not enlarged, symmetric, no tenderness/mass/nodules Back: symmetric, no curvature. ROM normal. No CVA tenderness. Lungs: clear to auscultation bilaterally Heart: regular rate and rhythm, S1, S2 normal, no murmur, click, rub or gallop Chest wall:  Gaunt,  Ribs showing,  Breast tissue nearly absent ,  Skin:  No neoplastic changes on chest wall.  Lesion in question appears to be SK vs dirt.  Abdomen: soft, non-tender; bowel sounds normal; no masses,  no organomegaly Pulses: 2+ and symmetric Skin: Skin color, texture, turgor normal. No rashes or lesions Lymph nodes: Cervical, supraclavicular, and axillary nodes normal.  No results found for: HGBA1C  Lab Results  Component Value Date   CREATININE 1.28 (H) 06/06/2020   CREATININE 1.16 04/06/2018   CREATININE 1.24 (H) 10/08/2017    Lab Results  Component Value Date   WBC 7.0 06/06/2020   HGB 11.6 (L) 06/06/2020   HCT 34.7 (L) 06/06/2020   PLT 171.0 06/06/2020   GLUCOSE 92 06/06/2020   CHOL 197 10/23/2012   TRIG 137 10/23/2012   HDL 61 (H) 10/23/2012   LDLCALC 109 (H) 10/23/2012   ALT 12 06/06/2020   AST 22 06/06/2020   NA 141 06/06/2020   K 3.7 06/06/2020   CL 101 06/06/2020   CREATININE 1.28 (H) 06/06/2020   BUN 23 06/06/2020   CO2 34 (H) 06/06/2020   TSH 1.55 06/06/2020   INR 2.2 (H) 02/13/2016    CT HEAD WO CONTRAST  Result Date: 12/17/2017 CLINICAL DATA:  Pain following fall.  Diplopia. EXAM: CT HEAD WITHOUT CONTRAST TECHNIQUE: Contiguous axial images were obtained from the base of the skull through the vertex without intravenous contrast. COMPARISON:  Head CT October 21, 2012 and brain MRI October 22, 2012. FINDINGS: Brain: Moderate diffuse atrophy is  stable. There is no intracranial mass, hemorrhage, extra-axial fluid collection, or midline shift. There is small vessel disease throughout the centra semiovale bilaterally. No new gray-white compartment lesion is evident. There is no acute infarct appreciable. Vascular: No hyperdense vessels are evident. There is calcification in each carotid siphon. Skull: The bony calvarium appears intact. Sinuses/Orbits: The patient has had antrostomies bilaterally. There is opacification throughout the sphenoid sinus region. There is opacification in several ethmoid air cells. There is mucosal thickening in the right maxillary antrum. Orbits appear symmetric bilaterally with evidence of scleral banding bilaterally. Other: Mastoid air cells are clear although somewhat hypoplastic on the left. IMPRESSION: Stable atrophy with supratentorial small vessel disease throughout the centra semiovale bilaterally. No acute infarct evident. No mass or hemorrhage. No extra-axial fluid collection. There  are foci of arterial vascular calcification. There is paranasal sinus disease, most marked in the sphenoid region. Electronically Signed   By: Lowella Grip III M.D.   On: 12/17/2017 14:02    Assessment & Plan:   Problem List Items Addressed This Visit      Unprioritized   Acquired thrombophilia ([place])    Her embolic stroke risk is managed with Eliquis after suffering a CVA several years ago.  She has not had any recent falls or episodes of uncontrolled bleeding.  Lab Results  Component Value Date   WBC 7.0 06/06/2020   HGB 11.6 (L) 06/06/2020   HCT 34.7 (L) 06/06/2020   MCV 96.2 06/06/2020   PLT 171.0 06/06/2020         Atrial fibrillation ([place])    She is rate controlled without medication.  Continue Eliquis for secondary stroke prevention       Dyspnea on exertion - Primary    Her lung exam is normal and she denies orthopnea and chest pain.   Reassurance given that a chest x ray is not necessary.  Etiology  likely deconditioning due to advanced age and concurrent atrial fibrillation      Relevant Orders   CBC with Differential/Platelet (Completed)   B Nat Peptide (Completed)   Hypertension    Well controlled on current regimen. Renal function low but stable, no changes today.  Lab Results  Component Value Date   CREATININE 1.28 (H) 06/06/2020   Lab Results  Component Value Date   NA 141 06/06/2020   K 3.7 06/06/2020   CL 101 06/06/2020   CO2 34 (H) 06/06/2020         Myalgia due to statin    Statin therapy was initiated after her AMI but she developed severe myalgias.  Given her age,  The risks of repeat trial outweigh the benefits.       Vascular dementia without behavioral disturbance ([place])    Her attention to personal hygiene appears adequate and she has had no ER visits or fall.  Her dementia has not progressed.        Other Visit Diagnoses    Fatigue, unspecified type       Relevant Orders   TSH (Completed)   Comprehensive metabolic panel (Completed)      I provided  30 minutes of  face-to-face time during this encounter reviewing patient's current problems and past surgeries, labs and imaging studies, providing counseling on the above mentioned problems , and coordination  of care . I am having Horton Finer maintain her calcium carbonate, ipratropium, carboxymethylcellulose, acetaminophen, nitroGLYCERIN, Vitamin B 12, Azopt, telmisartan, isosorbide mononitrate, indapamide, and Eliquis.  No orders of the defined types were placed in this encounter.   There are no discontinued medications.  Follow-up: No follow-ups on file.   Crecencio Mc, MD

## 2020-06-06 NOTE — Patient Instructions (Signed)
I recommend high protein drinks like Ensure and Boost for those times when you are not hungry enough to eat  Your son can activate your MyChart account    Please let the yardman prune your bushes.  Stick to your flowers!

## 2020-06-09 DIAGNOSIS — T466X5A Adverse effect of antihyperlipidemic and antiarteriosclerotic drugs, initial encounter: Secondary | ICD-10-CM | POA: Insufficient documentation

## 2020-06-09 DIAGNOSIS — R0609 Other forms of dyspnea: Secondary | ICD-10-CM | POA: Insufficient documentation

## 2020-06-09 DIAGNOSIS — D6869 Other thrombophilia: Secondary | ICD-10-CM | POA: Insufficient documentation

## 2020-06-09 NOTE — Assessment & Plan Note (Signed)
Her attention to personal hygiene appears adequate and she has had no ER visits or fall.  Her dementia has not progressed.

## 2020-06-09 NOTE — Assessment & Plan Note (Signed)
Her embolic stroke risk is managed with Eliquis after suffering a CVA several years ago.  She has not had any recent falls or episodes of uncontrolled bleeding.  Lab Results  Component Value Date   WBC 7.0 06/06/2020   HGB 11.6 (L) 06/06/2020   HCT 34.7 (L) 06/06/2020   MCV 96.2 06/06/2020   PLT 171.0 06/06/2020

## 2020-06-09 NOTE — Assessment & Plan Note (Signed)
She is rate controlled without medication.  Continue Eliquis for secondary stroke prevention

## 2020-06-09 NOTE — Assessment & Plan Note (Signed)
Statin therapy was initiated after her AMI but she developed severe myalgias.  Given her age,  The risks of repeat trial outweigh the benefits.

## 2020-06-09 NOTE — Assessment & Plan Note (Signed)
Well controlled on current regimen. Renal function low but stable, no changes today.  Lab Results  Component Value Date   CREATININE 1.28 (H) 06/06/2020   Lab Results  Component Value Date   NA 141 06/06/2020   K 3.7 06/06/2020   CL 101 06/06/2020   CO2 34 (H) 06/06/2020

## 2020-06-09 NOTE — Assessment & Plan Note (Signed)
Her lung exam is normal and she denies orthopnea and chest pain.   Reassurance given that a chest x ray is not necessary.  Etiology likely deconditioning due to advanced age and concurrent atrial fibrillation

## 2020-07-12 ENCOUNTER — Other Ambulatory Visit: Payer: Self-pay | Admitting: Internal Medicine

## 2020-09-04 ENCOUNTER — Other Ambulatory Visit: Payer: Self-pay | Admitting: Cardiovascular Disease

## 2020-09-24 ENCOUNTER — Ambulatory Visit: Payer: Medicare Other | Admitting: Cardiovascular Disease

## 2020-11-28 ENCOUNTER — Other Ambulatory Visit: Payer: Self-pay | Admitting: Cardiovascular Disease

## 2020-11-29 NOTE — Telephone Encounter (Signed)
Rx request sent to pharmacy.  

## 2020-12-24 ENCOUNTER — Ambulatory Visit: Payer: Self-pay | Admitting: Podiatry

## 2020-12-31 ENCOUNTER — Ambulatory Visit: Payer: Medicare Other | Admitting: Cardiovascular Disease

## 2020-12-31 ENCOUNTER — Ambulatory Visit: Payer: Medicare Other | Admitting: Podiatry

## 2020-12-31 ENCOUNTER — Other Ambulatory Visit: Payer: Self-pay

## 2020-12-31 DIAGNOSIS — L989 Disorder of the skin and subcutaneous tissue, unspecified: Secondary | ICD-10-CM

## 2020-12-31 DIAGNOSIS — M79674 Pain in right toe(s): Secondary | ICD-10-CM

## 2020-12-31 DIAGNOSIS — M79675 Pain in left toe(s): Secondary | ICD-10-CM

## 2020-12-31 DIAGNOSIS — B351 Tinea unguium: Secondary | ICD-10-CM

## 2020-12-31 NOTE — Progress Notes (Signed)
   SUBJECTIVE Patient presents to office today complaining of elongated, thickened nails that cause pain while ambulating in shoes.  She is unable to trim her own nails. Patient is here for further evaluation and treatment.  Past Medical History:  Diagnosis Date  . Atrial fibrillation (HCC)   . Colon polyp 2008  . Coronary artery disease   . DVT (deep venous thrombosis) [facility]) May 2007  . History of DVT of lower extremity May 2007  . Hyperlipidemia   . Hypertension   . Polyp, stomach   . Sensorineural hearing loss 2006   sudden, left ear  . Stroke [facility])     OBJECTIVE General Patient is awake, alert, and oriented x 3 and in no acute distress. Derm Skin is dry and supple bilateral. Negative open lesions or macerations. Remaining integument unremarkable. Nails are tender, long, thickened and dystrophic with subungual debris, consistent with onychomycosis, 1-5 bilateral. No signs of infection noted. Vasc  DP and PT pedal pulses palpable bilaterally. Temperature gradient within normal limits.  Neuro Epicritic and protective threshold sensation grossly intact bilaterally.  Musculoskeletal Exam No symptomatic pedal deformities noted bilateral. Muscular strength within normal limits.  ASSESSMENT 1. Onychodystrophic nails 1-5 bilateral with hyperkeratosis of nails.  2. Onychomycosis of nail due to dermatophyte bilateral 3. Pain in foot bilateral 4.  Preulcerative calluses bilateral feet  PLAN OF CARE 1. Patient evaluated today.  2. Instructed to maintain good pedal hygiene and foot care.  3. Mechanical debridement of nails 1-5 bilaterally performed using a nail nipper. Filed with dremel without incident.  4.  Excisional debridement of the preulcerative callus tissue was performed using a tissue nipper without incident or bleeding.   5.  Return to clinic in 3 mos.    Felecia Shelling, DPM [facility]  Dr. Felecia Shelling, DPM    [address]                                         [place], [place] [address]                Office [phone]  Fax [phone]

## 2021-03-20 ENCOUNTER — Ambulatory Visit: Payer: Medicare Other | Admitting: Cardiovascular Disease

## 2021-03-25 ENCOUNTER — Ambulatory Visit: Payer: Medicare Other | Admitting: Cardiovascular Disease

## 2021-03-31 ENCOUNTER — Ambulatory Visit: Payer: Medicare Other | Admitting: Podiatry

## 2021-04-24 ENCOUNTER — Encounter: Payer: Self-pay | Admitting: Cardiovascular Disease

## 2021-04-24 ENCOUNTER — Ambulatory Visit: Payer: Medicare Other | Admitting: Cardiovascular Disease

## 2021-04-24 ENCOUNTER — Other Ambulatory Visit: Payer: Self-pay

## 2021-04-24 VITALS — BP 130/76 | HR 69 | Ht 65.0 in | Wt 108.0 lb

## 2021-04-24 DIAGNOSIS — I10 Essential (primary) hypertension: Secondary | ICD-10-CM

## 2021-04-24 DIAGNOSIS — I482 Chronic atrial fibrillation, unspecified: Secondary | ICD-10-CM

## 2021-04-24 DIAGNOSIS — Z7901 Long term (current) use of anticoagulants: Secondary | ICD-10-CM

## 2021-04-24 NOTE — Progress Notes (Signed)
Cardiology Office Note   Date:  04/24/2021   ID:  Latoya Merritt, DOB 12-19-1920, MRN [id number]  PCP:  Sherlene Shams, MD  Cardiologist:   Lorine Bears, MD   Chief Complaint  Patient presents with  . Follow-up      History of Present Illness: Latoya Merritt is a 87 y.o. female who presents for regarding chronic atrial fibrillation. Other medical problems include hyperlipidemia , history of DVT, hypertension, bilateral mastectomy and previous stroke in 2013 while she was temporarily off anticoagulation. She did have a previous non-ST elevation MI with peak troponin 2.5 felt secondary to demand ischemia. No cardiac workup was performed apart from echocardiogram. Echocardiogram showed normal LV systolic function. She is on long-term anticoagulation with Eliquis.  She had a mechanical fall in December 2018 complicated by right hip hematoma but no recent falls. She has been doing reasonably well and denies any chest pain or worsening dyspnea.  No palpitations or dizziness.  She reports decreased appetite with some weight loss lately.     Past Medical History:  Diagnosis Date  . Atrial fibrillation (HCC)   . Colon polyp 2008  . Coronary artery disease   . DVT (deep venous thrombosis) [facility]) May 2007  . History of DVT of lower extremity May 2007  . Hyperlipidemia   . Hypertension   . Polyp, stomach   . Sensorineural hearing loss 2006   sudden, left ear  . Stroke [facility])     Past Surgical History:  Procedure Laterality Date  . ABDOMINAL HYSTERECTOMY  1965  . APPENDECTOMY  1950  . BREAST SURGERY  1972   mastectomies, presumed due to breast CA  . LAMINECTOMY  1975  . LASER SURGERY FOR VARICOSE VEINS    . NASAL POLYP SURGERY  2008   Dr. Jenne Campus, has been horase ever since  . THYROID SURGERY  1977   nodule removed  . TONSILLECTOMY  1943     Current Outpatient Medications  Medication Sig Dispense Refill  . acetaminophen (TYLENOL) 500 MG tablet Take 500 mg by mouth  every 6 (six) hours as needed for pain.    . brinzolamide (AZOPT) 1 % ophthalmic suspension Place 1 drop into the left eye daily.    . carboxymethylcellulose (REFRESH PLUS) 0.5 % SOLN 1 drop 3 (three) times daily as needed.    . cyanocobalamin 1000 MCG tablet Take 1,000 mcg by mouth daily.    Marland Kitchen ELIQUIS 2.5 MG TABS tablet TAKE 1 TABLET BY MOUTH  TWICE DAILY 180 tablet 3  . indapamide (LOZOL) 2.5 MG tablet TAKE 1 TABLET BY MOUTH IN  THE MORNING 90 tablet 3  . ipratropium (ATROVENT) 0.06 % nasal spray Place 2 sprays into the nose daily.    . isosorbide mononitrate (IMDUR) 60 MG 24 hr tablet TAKE 1 TABLET BY MOUTH  DAILY 90 tablet 3  . telmisartan (MICARDIS) 40 MG tablet TAKE 1 TABLET BY MOUTH  DAILY (REPLACES LISINOPRIL) 90 tablet 3   No current facility-administered medications for this visit.    Allergies:   Amoxicillin-pot clavulanate, Cephalexin, Clarithromycin, Clarithromycin, Clindamycin/lincomycin, Lansoprazole, Macrodantin, Sulfa antibiotics, Tussionex pennkinetic er [hydrocod polst-cpm polst er], Amoxicillin, Cephalexin, Clindamycin, Hydrocodone-chlorpheniramine, Lansoprazole, Lisinopril, Nitrofurantoin, and Other    Social History:  The patient  reports that she has never smoked. She has never used smokeless tobacco. She reports previous alcohol use of about 7.0 standard drinks of alcohol per week. She reports that she does not use drugs.   Family History:  The patient's family history includes Cancer in an other family member; Heart failure in her brother and mother.    ROS:  Please see the history of present illness.   Otherwise, review of systems are positive for none.   All other systems are reviewed and negative.    PHYSICAL EXAM: VS:  BP 130/76 (BP Location: Left Arm, Patient Position: Sitting, Cuff Size: Normal)   Pulse 69   Ht 5\' 5"  (1.651 m)   Wt 108 lb (49 kg)   SpO2 97%   BMI 17.97 kg/m  , BMI Body mass index is 17.97 kg/m. GEN: Frail older female in no acute  distress  HEENT: normal  Neck: no JVD, carotid bruits, or masses Cardiac: Irregularly irregular; no murmurs, rubs, or gallops,no edema  Respiratory:  clear to auscultation bilaterally, normal work of breathing GI: soft, nontender, nondistended, + BS MS: no deformity or atrophy  Skin: warm and dry, no rash Neuro:  Strength and sensation are intact Psych: euthymic mood, full affect   EKG:  EKG is ordered today. The ekg ordered today demonstrates atrial fibrillation with ventricular rate of 69.  Poor R wave progression in the anterior leads.   Recent Labs: 06/06/2020: ALT 12; BUN 23; Creatinine, Ser 1.28; Hemoglobin 11.6; Platelets 171.0; Potassium 3.7; Pro B Natriuretic peptide (BNP) 307.0; Sodium 141; TSH 1.55    Lipid Panel    Component Value Date/Time   CHOL 197 10/23/2012 0400   TRIG 137 10/23/2012 0400   HDL 61 (H) 10/23/2012 0400   VLDL 27 10/23/2012 0400   LDLCALC 109 (H) 10/23/2012 0400      Wt Readings from Last 3 Encounters:  04/24/21 108 lb (49 kg)  06/06/20 114 lb 6.4 oz (51.9 kg)  12/28/19 115 lb (52.2 kg)         ASSESSMENT AND PLAN:  1.  Chronic atrial fibrillation: She seems to be doing well with no symptoms of palpitations or shortness of breath.  Ventricular rate has been historically controlled without medications.  She is tolerating anticoagulation with low-dose Eliquis.  No recurrent falls.  2. Essential hypertension: Blood pressure is well controlled on Micardis.  3.  Possible coronary artery disease: Currently with no anginal symptoms.  4.  High risk medication: She continues to be on Eliquis with no bleeding complications.  I reviewed most recent labs from June of last year which showed stable anemia with a hemoglobin of 11.6.  Renal function was stable with a creatinine of 1.28.  No recent falls.    Disposition:   FU with me in 6 months  Signed,  July, MD  04/24/2021 4:55 PM    [facility]

## 2021-04-24 NOTE — Patient Instructions (Signed)

## 2021-05-28 ENCOUNTER — Telehealth: Payer: Self-pay | Admitting: Internal Medicine

## 2021-05-28 NOTE — Telephone Encounter (Signed)
Noted...awaiting access nurse note 

## 2021-05-28 NOTE — Telephone Encounter (Signed)
Transfer to Access Nurse  PT and neighbor called in to advise that she is having cramps while urinating and that her urine color has gone from Light Yellow to Dark Yellow. PT states no blood yet and no other symptoms atm.

## 2021-05-29 ENCOUNTER — Other Ambulatory Visit: Payer: Self-pay

## 2021-05-29 ENCOUNTER — Ambulatory Visit (INDEPENDENT_AMBULATORY_CARE_PROVIDER_SITE_OTHER): Payer: Medicare Other

## 2021-05-29 ENCOUNTER — Ambulatory Visit: Payer: Medicare Other | Admitting: Internal Medicine

## 2021-05-29 VITALS — BP 110/70 | HR 76 | Temp 98.2°F | Ht 65.0 in | Wt 106.4 lb

## 2021-05-29 DIAGNOSIS — Z23 Encounter for immunization: Secondary | ICD-10-CM

## 2021-05-29 DIAGNOSIS — Z1329 Encounter for screening for other suspected endocrine disorder: Secondary | ICD-10-CM

## 2021-05-29 DIAGNOSIS — D649 Anemia, unspecified: Secondary | ICD-10-CM

## 2021-05-29 DIAGNOSIS — I10 Essential (primary) hypertension: Secondary | ICD-10-CM

## 2021-05-29 DIAGNOSIS — E538 Deficiency of other specified B group vitamins: Secondary | ICD-10-CM | POA: Diagnosis not present

## 2021-05-29 DIAGNOSIS — N3000 Acute cystitis without hematuria: Secondary | ICD-10-CM | POA: Diagnosis not present

## 2021-05-29 DIAGNOSIS — R0789 Other chest pain: Secondary | ICD-10-CM | POA: Diagnosis not present

## 2021-05-29 DIAGNOSIS — J90 Pleural effusion, not elsewhere classified: Secondary | ICD-10-CM

## 2021-05-29 DIAGNOSIS — T148XXA Other injury of unspecified body region, initial encounter: Secondary | ICD-10-CM

## 2021-05-29 DIAGNOSIS — R0602 Shortness of breath: Secondary | ICD-10-CM

## 2021-05-29 DIAGNOSIS — I2510 Atherosclerotic heart disease of native coronary artery without angina pectoris: Secondary | ICD-10-CM

## 2021-05-29 DIAGNOSIS — J189 Pneumonia, unspecified organism: Secondary | ICD-10-CM

## 2021-05-29 DIAGNOSIS — I517 Cardiomegaly: Secondary | ICD-10-CM

## 2021-05-29 DIAGNOSIS — R7989 Other specified abnormal findings of blood chemistry: Secondary | ICD-10-CM

## 2021-05-29 DIAGNOSIS — J439 Emphysema, unspecified: Secondary | ICD-10-CM

## 2021-05-29 LAB — TROPONIN I (HIGH SENSITIVITY): High Sens Troponin I: 16 ng/L (ref 2–17)

## 2021-05-29 LAB — CBC WITH DIFFERENTIAL/PLATELET
Basophils Absolute: 0 10*3/uL (ref 0.0–0.1)
Basophils Relative: 0.8 % (ref 0.0–3.0)
Eosinophils Absolute: 0.1 10*3/uL (ref 0.0–0.7)
Eosinophils Relative: 1 % (ref 0.0–5.0)
HCT: 33.7 % — ABNORMAL LOW (ref 36.0–46.0)
Hemoglobin: 11.3 g/dL — ABNORMAL LOW (ref 12.0–15.0)
Lymphocytes Relative: 32.8 % (ref 12.0–46.0)
Lymphs Abs: 1.8 10*3/uL (ref 0.7–4.0)
MCHC: 33.5 g/dL (ref 30.0–36.0)
MCV: 96 fl (ref 78.0–100.0)
Monocytes Absolute: 0.4 10*3/uL (ref 0.1–1.0)
Monocytes Relative: 6.4 % (ref 3.0–12.0)
Neutro Abs: 3.3 10*3/uL (ref 1.4–7.7)
Neutrophils Relative %: 59 % (ref 43.0–77.0)
Platelets: 159 10*3/uL (ref 150.0–400.0)
RBC: 3.51 Mil/uL — ABNORMAL LOW (ref 3.87–5.11)
RDW: 12.9 % (ref 11.5–15.5)
WBC: 5.5 10*3/uL (ref 4.0–10.5)

## 2021-05-29 LAB — COMPREHENSIVE METABOLIC PANEL
ALT: 17 U/L (ref 0–35)
AST: 27 U/L (ref 0–37)
Albumin: 4.2 g/dL (ref 3.5–5.2)
Alkaline Phosphatase: 43 U/L (ref 39–117)
BUN: 30 mg/dL — ABNORMAL HIGH (ref 6–23)
CO2: 31 mEq/L (ref 19–32)
Calcium: 9.7 mg/dL (ref 8.4–10.5)
Chloride: 101 mEq/L (ref 96–112)
Creatinine, Ser: 1.5 mg/dL — ABNORMAL HIGH (ref 0.40–1.20)
GFR: 28.71 mL/min — ABNORMAL LOW (ref >60.00)
Glucose, Bld: 121 mg/dL — ABNORMAL HIGH (ref 70–99)
Potassium: 3.3 mEq/L — ABNORMAL LOW (ref 3.5–5.1)
Sodium: 140 mEq/L (ref 135–145)
Total Bilirubin: 1 mg/dL (ref 0.2–1.2)
Total Protein: 7 g/dL (ref 6.0–8.3)

## 2021-05-29 LAB — TSH: TSH: 1.3 u[IU]/mL (ref 0.35–4.50)

## 2021-05-29 LAB — VITAMIN B12: Vitamin B-12: >1550 pg/mL — ABNORMAL HIGH (ref 211–911)

## 2021-05-29 MED ORDER — MUPIROCIN 2 % EX OINT
1.0000 | TOPICAL_OINTMENT | Freq: Two times a day (BID) | CUTANEOUS | 0 refills | Status: AC
Start: 2021-05-29 — End: ?

## 2021-05-29 NOTE — Telephone Encounter (Signed)
FYI for you. 

## 2021-05-29 NOTE — Addendum Note (Signed)
Addended by: Charlyne Mom D on: 05/29/2021 02:43 PM   Modules accepted: Orders

## 2021-05-29 NOTE — Telephone Encounter (Signed)
Pt is being seen by Dr. French Ana today at 1:30pm

## 2021-05-29 NOTE — Telephone Encounter (Addendum)
Access Nurse Documentation   

## 2021-05-29 NOTE — Progress Notes (Addendum)
Chief Complaint  Patient presents with   Acute Visit    Cramping in lower ab/ drk urine   F/u with neighbor diane  of 30 years son [name] lives in [place] 1. C/w UTI X 2 days had darker urine and lower ab cramping urgency using pads sometimes does not make the restroom  2. HTN controlled on micardis 40 mg qd and imur 60 mg qd h/o dvt on eliquis  3. Wound to left lower leg recently tree twig rubbed her leg and has a bandage on it today Will give Td  Review of Systems  Constitutional:  Negative for weight loss.  HENT:  Positive for hearing loss.   Eyes:  Negative for blurred vision.  Respiratory:  Negative for shortness of breath.   Cardiovascular:  Negative for chest pain.  Gastrointestinal:  Negative for abdominal pain.  Genitourinary:  Positive for urgency.  Skin:  Negative for rash.  Neurological:  Negative for dizziness.  Psychiatric/Behavioral:  Negative for depression.   Past Medical History:  Diagnosis Date   Atrial fibrillation [facility])    Colon polyp 2008   Coronary artery disease    DVT (deep venous thrombosis) (HCC) May 2007   History of DVT of lower extremity May 2007   Hyperlipidemia    Hypertension    Polyp, stomach    Sensorineural hearing loss 2006   sudden, left ear   Stroke [facility])    Past Surgical History:  Procedure Laterality Date   ABDOMINAL HYSTERECTOMY  1965   APPENDECTOMY  1950   BREAST SURGERY  1972   mastectomies, presumed due to breast CA   LAMINECTOMY  1975   LASER SURGERY FOR VARICOSE VEINS     NASAL POLYP SURGERY  2008   Dr. Jenne Campus, has been horase ever since   THYROID SURGERY  1977   nodule removed   TONSILLECTOMY  1943   Family History  Problem Relation Age of Onset   Heart failure Mother    Heart failure Brother    Cancer Other    Social History   Socioeconomic History   Marital status: Married    Spouse name: Not on file   Number of children: Not on file   Years of education: Not on file   Highest education level: Not on file   Occupational History   Not on file  Tobacco Use   Smoking status: Never   Smokeless tobacco: Never  Vaping Use   Vaping Use: Never used  Substance and Sexual Activity   Alcohol use: Not Currently    Alcohol/week: 7.0 standard drinks    Types: 7 Glasses of wine per week    Comment: glass a wine at bedtime   Drug use: No   Sexual activity: Never  Other Topics Concern   Not on file  Social History Narrative   Not on file   Social Determinants of Health   Financial Resource Strain: Not on file  Food Insecurity: Not on file  Transportation Needs: Not on file  Physical Activity: Not on file  Stress: Not on file  Social Connections: Not on file  Intimate Partner Violence: Not on file   Current Meds  Medication Sig   acetaminophen (TYLENOL) 500 MG tablet Take 500 mg by mouth every 6 (six) hours as needed for pain.   brinzolamide (AZOPT) 1 % ophthalmic suspension Place 1 drop into the left eye daily.   carboxymethylcellulose (REFRESH PLUS) 0.5 % SOLN 1 drop 3 (three) times daily as needed.  cyanocobalamin 1000 MCG tablet Take 1,000 mcg by mouth daily.   ELIQUIS 2.5 MG TABS tablet TAKE 1 TABLET BY MOUTH  TWICE DAILY   indapamide (LOZOL) 2.5 MG tablet TAKE 1 TABLET BY MOUTH IN  THE MORNING   ipratropium (ATROVENT) 0.06 % nasal spray Place 2 sprays into the nose daily.   isosorbide mononitrate (IMDUR) 60 MG 24 hr tablet TAKE 1 TABLET BY MOUTH  DAILY   mupirocin ointment (BACTROBAN) 2 % Apply 1 application topically 2 (two) times daily.   telmisartan (MICARDIS) 40 MG tablet TAKE 1 TABLET BY MOUTH  DAILY (REPLACES LISINOPRIL)   Allergies  Allergen Reactions   Amoxicillin-Pot Clavulanate     Swelling & rash   Cephalexin    Clarithromycin     rash   Clarithromycin Hives    rash   Clindamycin/Lincomycin     rash   Lansoprazole    Macrodantin    Sulfa Antibiotics    Tussionex Pennkinetic Er [Hydrocod Polst-Cpm Polst Er]     Rash across her chest and side   Amoxicillin Rash     rash   Cephalexin Rash    rash   Clindamycin Rash   Hydrocodone-Chlorpheniramine Rash    Rash across her chest and side   Lansoprazole Rash   Lisinopril Rash    Bad dreams  Bad dreams    Nitrofurantoin Rash   Other Rash   No results found for this or any previous visit (from the past 2160 hour(s)). Objective  Body mass index is 17.71 kg/m. Wt Readings from Last 3 Encounters:  05/29/21 106 lb 6.4 oz (48.3 kg)  04/24/21 108 lb (49 kg)  06/06/20 114 lb 6.4 oz (51.9 kg)   Temp Readings from Last 3 Encounters:  05/29/21 98.2 F (36.8 C) (Oral)  06/06/20 (!) 96.9 F (36.1 C) (Temporal)  04/06/18 97.6 F (36.4 C) (Oral)   BP Readings from Last 3 Encounters:  05/29/21 110/70  04/24/21 130/76  06/06/20 110/68   Pulse Readings from Last 3 Encounters:  05/29/21 76  04/24/21 69  06/06/20 68    Physical Exam Vitals and nursing note reviewed.  Constitutional:      Appearance: Normal appearance. She is well-developed and well-groomed.  HENT:     Head: Normocephalic and atraumatic.  Eyes:     Conjunctiva/sclera: Conjunctivae normal.     Pupils: Pupils are equal, round, and reactive to light.  Cardiovascular:     Rate and Rhythm: Normal rate and regular rhythm.     Heart sounds: Normal heart sounds. No murmur heard. Pulmonary:     Effort: Pulmonary effort is normal.     Breath sounds: Normal breath sounds.  Abdominal:     Tenderness: There is no abdominal tenderness.  Skin:    General: Skin is warm and dry.  Neurological:     General: No focal deficit present.     Mental Status: She is alert and oriented to person, place, and time. Mental status is at baseline.     Gait: Gait abnormal.     Comments: BL walks with cane   Psychiatric:        Attention and Perception: Attention and perception normal.        Mood and Affect: Mood and affect normal.        Speech: Speech normal.        Behavior: Behavior normal. Behavior is cooperative.        Thought Content:  Thought content normal.  Cognition and Memory: Cognition normal. She exhibits impaired recent memory.        Judgment: Judgment normal.    Assessment  Plan  Acute cystitis without hematuria r/o uti will bring back urine - Plan: Urinalysis, Routine w reflex microscopic, Urine Culture pt neighbor to bring back today  Called and updated son Chis [phone] and wants to be update on labs   Hypertension, unspecified type - Plan: CBC w/Diff, Comprehensive metabolic panel Controlled on benicar 40 mg qd and imdur 60 mg qd   Anemia, unspecified type - Plan: CBC w/Diff, Iron, TIBC and Ferritin Panel   B12 deficiency - Plan: Vitamin B12  Open wound - Plan: mupirocin ointment (BACTROBAN) 2 % Td given today Dove antibacterial soap   Chest tightness normal trop 05/29/21 16 but elevated bnp- Plan: DG Chest 2 View, Troponin I (High Sensitivity), Pro b natriuretic peptide (BNP) F/u with cards Dr. Kirke Corin if needed seen in 04/2021  Likely needs ECHO   SOB (shortness of breath) on exertion with bl pleural effusions ? LLL pneumonia ? aspiration  Cxr 05/29/21  FINDINGS: Background of emphysema. Probable small left pleural effusion and left lung base atelectasis. Trace right pleural effusion may be present. Pneumonia is not excluded clinical correlation is recommended. There is no pneumothorax. Stable cardiomegaly. Atherosclerotic calcification of the aorta. Osteopenia with degenerative changes of the spine. No acute osseous pathology.   IMPRESSION: Emphysema with probable small left pleural effusion and left lung base atelectasis. Pneumonia is not excluded.   - Plan: Troponin I (High Sensitivity), normal Pro b natriuretic peptide (BNP) 2, 129 elevated   Rx doxycycline 100 mg bid x 7 days and flagyl 500 mg tid x 7 days with allergies to Abx  F/u with [facility]  Coronary artery disease involving native coronary artery of native heart without angina pectoris - Plan: Troponin I (High  Sensitivity), Pro b natriuretic peptide (BNP)    Provider: Dr. French Ana McLean-Scocuz[facility]

## 2021-05-29 NOTE — Patient Instructions (Signed)
Nonspecific Chest Pain, Adult Chest pain can be caused by many different conditions. It can be caused by a condition that is life-threatening and requires treatment right away. It can also be caused by something that is not life-threatening. If you have chest pain, it can be hard to know the difference, so it is important to get help right away to make sure that you do not have a serious condition. Some life-threatening causes of chest pain include: Heart attack. A tear in the body's main blood vessel (aortic dissection). Inflammation around your heart (pericarditis). A problem in the lungs, such as a blood clot (pulmonary embolism) or a collapsed lung (pneumothorax). Some non life-threatening causes of chest pain include: Heartburn. Anxiety or stress. Damage to the bones, muscles, and cartilage that make up your chest wall. Pneumonia or bronchitis. Shingles infection (varicella-zoster virus). Chest pain can feel like: Pain or discomfort on the surface of your chest or deep in your chest. Crushing, pressure, aching, or squeezing pain. Burning or tingling. Dull or sharp pain that is worse when you move, cough, or take a deep breath. Pain or discomfort that is also felt in your back, neck, jaw, shoulder, or arm, or pain that spreads to any of these areas. Your chest pain may come and go. It may also be constant. Your health care provider will do lab tests and other studies to find the cause of your pain. Treatment will depend on the cause of your chest pain. Follow these instructions at home: Medicines Take over-the-counter and prescription medicines only as told by your health care provider. If you were prescribed an antibiotic, take it as told by your health care provider. Do not stop taking the antibiotic even if you start to feel better. Lifestyle Rest as directed by your health care provider. Do not use any products that contain nicotine or tobacco, such as cigarettes and e-cigarettes.  If you need help quitting, ask your health care provider. Do not drink alcohol. Make healthy lifestyle choices as recommended. These may include: Getting regular exercise. Ask your health care provider to suggest some activities that are safe for you. Eating a heart-healthy diet. This includes plenty of fresh fruits and vegetables, whole grains, low-fat (lean) protein, and low-fat dairy products. A dietitian can help you find healthy eating options. Maintaining a healthy weight. Managing any other health conditions you have, such as high blood pressure (hypertension) or diabetes. Reducing stress, such as with yoga or relaxation techniques.   General instructions Pay attention to any changes in your symptoms. Tell your health care provider about them or any new symptoms. Avoid any activities that cause chest pain. Keep all follow-up visits as told by your health care provider. This is important. This includes visits for any further testing if your chest pain does not go away. Contact a health care provider if: Your chest pain does not go away. You feel depressed. You have a fever. Get help right away if: Your chest pain gets worse. You have a cough that gets worse, or you cough up blood. You have severe pain in your abdomen. You faint. You have sudden, unexplained chest discomfort. You have sudden, unexplained discomfort in your arms, back, neck, or jaw. You have shortness of breath at any time. You suddenly start to sweat, or your skin gets clammy. You feel nausea or you vomit. You suddenly feel lightheaded or dizzy. You have severe weakness, or unexplained weakness or fatigue. Your heart begins to beat quickly, or it feels like  it is skipping beats. These symptoms may represent a serious problem that is an emergency. Do not wait to see if the symptoms will go away. Get medical help right away. Call your local emergency services (911 in the [place] Do not drive yourself to the  hospital. Summary Chest pain can be caused by a condition that is serious and requires urgent treatment. It may also be caused by something that is not life-threatening. If you have chest pain, it is very important to see your health care provider. Your health care provider may do lab tests and other studies to find the cause of your pain. Follow your health care provider's instructions on taking medicines, making lifestyle changes, and getting emergency treatment if symptoms become worse. Keep all follow-up visits as told by your health care provider. This includes visits for any further testing if your chest pain does not go away. This information is not intended to replace advice given to you by your health care provider. Make sure you discuss any questions you have with your health care provider. Document Revised: 06/09/2018 Document Reviewed: 06/09/2018 Elsevier Patient Education  2021 Elsevier Inc.  Urinary Tract Infection, Adult A urinary tract infection (UTI) is an infection of any part of the urinary tract. The urinary tract includes: The kidneys. The ureters. The bladder. The urethra. These organs make, store, and get rid of pee (urine) in the body. What are the causes? This infection is caused by germs (bacteria) in your genital area. These germs grow and cause swelling (inflammation) of your urinary tract. What increases the risk? The following factors may make you more likely to develop this condition: Using a small, thin tube (catheter) to drain pee. Not being able to control when you pee or poop (incontinence). Being female. If you are female, these things can increase the risk: Using these methods to prevent pregnancy: A medicine that kills sperm (spermicide). A device that blocks sperm (diaphragm). Having low levels of a female hormone (estrogen). Being pregnant. You are more likely to develop this condition if: You have genes that add to your risk. You are sexually  active. You take antibiotic medicines. You have trouble peeing because of: A prostate that is bigger than normal, if you are female. A blockage in the part of your body that drains pee from the bladder. A kidney stone. A nerve condition that affects your bladder. Not getting enough to drink. Not peeing often enough. You have other conditions, such as: Diabetes. A weak disease-fighting system (immune system). Sickle cell disease. Gout. Injury of the spine. What are the signs or symptoms? Symptoms of this condition include: Needing to pee right away. Peeing small amounts often. Pain or burning when peeing. Blood in the pee. Pee that smells bad or not like normal. Trouble peeing. Pee that is cloudy. Fluid coming from the vagina, if you are female. Pain in the belly or lower back. Other symptoms include: Vomiting. Not feeling hungry. Feeling mixed up (confused). This may be the first symptom in older adults. Being tired and grouchy (irritable). A fever. Watery poop (diarrhea). How is this treated? Taking antibiotic medicine. Taking other medicines. Drinking enough water. In some cases, you may need to see a specialist. Follow these instructions at home: Medicines Take over-the-counter and prescription medicines only as told by your doctor. If you were prescribed an antibiotic medicine, take it as told by your doctor. Do not stop taking it even if you start to feel better. General instructions Make sure you:  Pee until your bladder is empty. Do not hold pee for a long time. Empty your bladder after sex. Wipe from front to back after peeing or pooping if you are a female. Use each tissue one time when you wipe. Drink enough fluid to keep your pee pale yellow. Keep all follow-up visits.   Contact a doctor if: You do not get better after 1-2 days. Your symptoms go away and then come back. Get help right away if: You have very bad back pain. You have very bad pain in your  lower belly. You have a fever. You have chills. You feeling like you will vomit or you vomit. Summary A urinary tract infection (UTI) is an infection of any part of the urinary tract. This condition is caused by germs in your genital area. There are many risk factors for a UTI. Treatment includes antibiotic medicines. Drink enough fluid to keep your pee pale yellow. This information is not intended to replace advice given to you by your health care provider. Make sure you discuss any questions you have with your health care provider. Document Revised: 07/19/2020 Document Reviewed: 07/19/2020 Elsevier Patient Education  2021 [employer].

## 2021-05-30 LAB — IRON,TIBC AND FERRITIN PANEL
%SAT: 40 % (calc) (ref 16–45)
Ferritin: 131 ng/mL (ref 16–288)
Iron: 106 ug/dL (ref 45–160)
TIBC: 266 mcg/dL (calc) (ref 250–450)

## 2021-05-30 LAB — PRO B NATRIURETIC PEPTIDE: NT-Pro BNP: 2129 pg/mL — ABNORMAL HIGH (ref 0–738)

## 2021-05-30 NOTE — Addendum Note (Signed)
Addended by: Warden Fillers on: 05/30/2021 09:52 AM   Modules accepted: Orders

## 2021-05-31 LAB — URINALYSIS, ROUTINE W REFLEX MICROSCOPIC
Bacteria, UA: NONE SEEN /HPF
Bilirubin Urine: NEGATIVE
Glucose, UA: NEGATIVE
Hgb urine dipstick: NEGATIVE
Hyaline Cast: NONE SEEN /LPF
Ketones, ur: NEGATIVE
Nitrite: NEGATIVE
Protein, ur: NEGATIVE
RBC / HPF: NONE SEEN /HPF (ref 0–2)
Specific Gravity, Urine: 1.007 (ref 1.001–1.035)
Squamous Epithelial / HPF: NONE SEEN /HPF
pH: 7.5 (ref 5.0–8.0)

## 2021-05-31 LAB — URINE CULTURE
MICRO NUMBER:: 11993972
SPECIMEN QUALITY:: ADEQUATE

## 2021-05-31 LAB — MICROSCOPIC MESSAGE

## 2021-06-03 ENCOUNTER — Telehealth: Payer: Self-pay

## 2021-06-03 NOTE — Telephone Encounter (Signed)
-----   Message from Bevelyn Buckles, MD sent at 06/03/2021  1:01 PM EDT ----- No UTI  See definitely needs antibiotics based on chest Xray  Emphysema with probable small left pleural effusion and left lung base atelectasis. Pneumonia is not excluded.  Is she agreeable to  1. lung doctor referral and check in with  2. heart doctor?   Also let son know results please

## 2021-06-03 NOTE — Progress Notes (Signed)
Left message for patient to return call back for lab/xray results.

## 2021-06-03 NOTE — Telephone Encounter (Signed)
Pt's son [name] called about pt's labs. Please return his call [phone]

## 2021-06-04 ENCOUNTER — Encounter: Payer: Self-pay | Admitting: Internal Medicine

## 2021-06-04 ENCOUNTER — Other Ambulatory Visit: Payer: Self-pay | Admitting: Internal Medicine

## 2021-06-04 DIAGNOSIS — J439 Emphysema, unspecified: Secondary | ICD-10-CM | POA: Insufficient documentation

## 2021-06-04 DIAGNOSIS — J90 Pleural effusion, not elsewhere classified: Secondary | ICD-10-CM | POA: Insufficient documentation

## 2021-06-04 DIAGNOSIS — I517 Cardiomegaly: Secondary | ICD-10-CM | POA: Insufficient documentation

## 2021-06-04 MED ORDER — METRONIDAZOLE 500 MG PO TABS: 500.0000 mg | ORAL_TABLET | Freq: Three times a day (TID) | ORAL | 0 refills | Status: DC

## 2021-06-04 MED ORDER — DOXYCYCLINE HYCLATE 100 MG PO TABS: 100.0000 mg | ORAL_TABLET | Freq: Two times a day (BID) | ORAL | 0 refills | Status: DC

## 2021-06-04 NOTE — Addendum Note (Signed)
Addended by: Quentin Ore on: 06/04/2021 06:48 PM   Modules accepted: Orders

## 2021-06-05 ENCOUNTER — Telehealth: Payer: Self-pay | Admitting: Pulmonary Disease

## 2021-06-05 NOTE — Telephone Encounter (Signed)
Received urgent referral for pleural effusion, chest tightness and emphysema.   Dr. Jayme Cloud, please review chart and determine if first available appt is okay?

## 2021-06-05 NOTE — Telephone Encounter (Signed)
I have reviewed the films, these were compared to prior films, changes noted are SENESCENT changes.  These are changes that occur with age.  I do not see a pleural effusion nor pneumonia.  This in particular when compared to prior film which showed the exact same findings.  If bronchitis or active infection is suspected empiric antibiotics are recommended.  Patient can be scheduled on next available slot.

## 2021-06-05 NOTE — Telephone Encounter (Signed)
Spoke to patient and offered appt for 07/23/2021. Patient stated that she would like to discuss this with her son and call back to schedule.

## 2021-06-06 ENCOUNTER — Ambulatory Visit: Payer: Medicare Other | Admitting: Physician Assistant

## 2021-06-06 ENCOUNTER — Other Ambulatory Visit: Payer: Self-pay

## 2021-06-06 ENCOUNTER — Other Ambulatory Visit
Admission: RE | Admit: 2021-06-06 | Discharge: 2021-06-06 | Disposition: A | Discharge status: Home / Self Care | Payer: Medicare Other | Source: Ambulatory Visit | Attending: Physician Assistant | Admitting: Physician Assistant

## 2021-06-06 ENCOUNTER — Encounter: Payer: Self-pay | Admitting: Physician Assistant

## 2021-06-06 ENCOUNTER — Telehealth: Payer: Self-pay | Admitting: Physician Assistant

## 2021-06-06 VITALS — BP 92/62 | HR 76 | Ht 65.0 in | Wt 105.6 lb

## 2021-06-06 DIAGNOSIS — R079 Chest pain, unspecified: Secondary | ICD-10-CM | POA: Insufficient documentation

## 2021-06-06 DIAGNOSIS — E861 Hypovolemia: Secondary | ICD-10-CM

## 2021-06-06 DIAGNOSIS — I482 Chronic atrial fibrillation, unspecified: Secondary | ICD-10-CM

## 2021-06-06 DIAGNOSIS — R9431 Abnormal electrocardiogram [ECG] [EKG]: Secondary | ICD-10-CM | POA: Diagnosis not present

## 2021-06-06 DIAGNOSIS — Z7901 Long term (current) use of anticoagulants: Secondary | ICD-10-CM | POA: Diagnosis not present

## 2021-06-06 DIAGNOSIS — E876 Hypokalemia: Secondary | ICD-10-CM

## 2021-06-06 DIAGNOSIS — Z79899 Other long term (current) drug therapy: Secondary | ICD-10-CM

## 2021-06-06 DIAGNOSIS — I9589 Other hypotension: Secondary | ICD-10-CM | POA: Diagnosis not present

## 2021-06-06 DIAGNOSIS — I2510 Atherosclerotic heart disease of native coronary artery without angina pectoris: Secondary | ICD-10-CM

## 2021-06-06 DIAGNOSIS — F0150 Vascular dementia without behavioral disturbance: Secondary | ICD-10-CM

## 2021-06-06 DIAGNOSIS — R7989 Other specified abnormal findings of blood chemistry: Secondary | ICD-10-CM

## 2021-06-06 LAB — TROPONIN I (HIGH SENSITIVITY): Troponin I (High Sensitivity): 16 ng/L (ref <18)

## 2021-06-06 NOTE — Telephone Encounter (Signed)
Called the patient's neighbor, as she drove the patient into the office today.  Informed her that the Tn was borderline at 16, and ideally, we would obtain a second one within 2 hours.  They are aware that the recommendation is for the pt to go to the ED based on sx and EKG today.  Reassessed if pt agreeable to ED - Pt again politely declines ED due to concern for COVID-19 exposure, as well as the amount of time needed to wait for the lab. Neighbor reports pt did not even want to come into the clinic in the first place today and wishes to go home.  If not going to the ED, offered to place an order for a second HS Tn at the [facility] lab (not in the ED), so that the pt get some dinner and then come back before the lab closes at 8PM (for a second Tn, two hours apart, and to trend).  Per neighbor, the pt may not be agreeable to come back to the hospital. She states that she suspects that instead they only be able to get her back to the lab in the morning (if she agrees).    Given pt soft BP in clinic with known effusions by CXR and elevated BNP, recommended was thus that she hold her ARB tonight to give her some room in pressure. This will hopefully allow for room in BP enough for diuresis over the weekend (if not in the ED) given CXR results and BNP value. If needed, we can hold Imdur as well, though given her CP, we will keep this medication on for now.  Provided neighbor with my personal pager number and encouraged her to page me if any concerns this weekend.  Again reviewed the official recommendation is that she present to the ED as confirmed by another provider and given her sx and EKG TWI on clinic EKG with HS Tn 16.   Both neighbor and pt understand official recommendation is to go to the ED.  Orders placed for repeat Tn, in the event they make it to the lab tonight or tomorrow AM.

## 2021-06-06 NOTE — Patient Instructions (Addendum)
Medication Instructions:  Your physician recommends that you continue on your current medications as directed. Please refer to the Current Medication list given to you today.  *If you need a refill on your cardiac medications before your next appointment, please call your pharmacy*   Lab Work: HIGH SENSITIVITY TROPONIN LEVEL TO BE DRAWN AT [facility] NOW.  If you have labs (blood work) drawn today and your tests are completely normal, you will receive your results only by: MyChart Message (if you have MyChart) OR A paper copy in the mail If you have any lab test that is abnormal or we need to change your treatment, we will call you to review the results.   Testing/Procedures: None ordered   Follow-Up: At [facility], you and your health needs are our priority.  As part of our continuing mission to provide you with exceptional heart care, we have created designated Provider Care Teams.  These Care Teams include your primary Cardiologist (physician) and Advanced Practice Providers (APPs -  Physician Assistants and Nurse Practitioners) who all work together to provide you with the care you need, when you need it.  We recommend signing up for the patient portal called "MyChart".  Sign up information is provided on this After Visit Summary.  MyChart is used to connect with patients for Virtual Visits (Telemedicine).  Patients are able to view lab/test results, encounter notes, upcoming appointments, etc.  Non-urgent messages can be sent to your provider as well.   To learn more about what you can do with MyChart, go to [email or web address].    Your next appointment:    Pending troponin level-Jacqueline will call patient with results.   The format for your next appointment:   In Person  Provider:   You will see one of the following Advanced Practice Providers on your designated Care Team:  Dr. Perlie Mayo, PA-C

## 2021-06-06 NOTE — Progress Notes (Signed)
Office Visit    Patient Name: Latoya Merritt Date of Encounter: 06/06/2021  PCP:  Sherlene Shams, MD   [facility]  Cardiologist:  Lorine Bears, MD  Advanced Practice Provider:  No care team member to display Electrophysiologist:  None :[id number]}   Chief Complaint    Chief Complaint  Patient presents with   Follow-up    Chest tightness, SOB, CAD, Cardiomegaly, elevated BNP--urgent referral from PCP    87 y.o. female with history of chronic atrial fibrillation, hyperlipidemia, history of DVT, hypertension, bilateral mastectomy, previous stroke in 2013 while temporarily off anticoagulation, previous non-STEMI with peak troponin 2.5 felt secondary to demand ischemia and no further cardiac work-up performed apart from echocardiogram, and who presents today with chest tightness, shortness of breath, and as part of an urgent referral from her PCP.  Past Medical History    Past Medical History:  Diagnosis Date   Atrial fibrillation [facility])    Colon polyp 2008   Coronary artery disease    DVT (deep venous thrombosis) (HCC) May 2007   History of DVT of lower extremity May 2007   Hyperlipidemia    Hypertension    Polyp, stomach    Sensorineural hearing loss 2006   sudden, left ear   Stroke [facility])    Past Surgical History:  Procedure Laterality Date   ABDOMINAL HYSTERECTOMY  1965   APPENDECTOMY  1950   BREAST SURGERY  1972   mastectomies, presumed due to breast CA   LAMINECTOMY  1975   LASER SURGERY FOR VARICOSE VEINS     NASAL POLYP SURGERY  2008   Dr. Jenne Campus, has been horase ever since   THYROID SURGERY  1977   nodule removed   TONSILLECTOMY  1943    Allergies  Allergies  Allergen Reactions   Amoxicillin-Pot Clavulanate     Swelling & rash   Cephalexin    Clarithromycin     rash   Clarithromycin Hives    rash   Clindamycin/Lincomycin     rash   Lansoprazole    Macrodantin    Sulfa Antibiotics    Tussionex Pennkinetic Er  [Hydrocod Polst-Cpm Polst Er]     Rash across her chest and side   Amoxicillin Rash    rash   Cephalexin Rash    rash   Clindamycin Rash   Hydrocodone-Chlorpheniramine Rash    Rash across her chest and side   Lansoprazole Rash   Lisinopril Rash    Bad dreams  Bad dreams    Nitrofurantoin Rash   Other Rash    History of Present Illness    Latoya Merritt is a 87 y.o. female with PMH as above.  She has history of chronic atrial fibrillation on reduced dose Eliquis, history of DVT, hypertension, bilateral mastectomy, previous stroke 2013 when temporarily off anticoagulation, previous non-STEMI with peak troponin 2.5 felt secondary to demand ischemia and with only subsequent echocardiogram performed, and here today for urgent referral from PCP due to abnormal chest x-ray and labs.  Of note, her neighbor Latoya Merritt of 30 years lives next door to her and helps manage her medications and assist with taking her to urgent visits.  Her son, Latoya Merritt, lives in [place].  Latoya Merritt checks on Latoya Merritt frequently.  And is very familiar with her medications and diagnoses  She was last seen by her primary cardiologist 04/24/2021.  At that time, it was noted she was on long-term anticoagulation with Eliquis.  She had  a mechanical fall in December 2018, complicated by right hip hematoma.  No recent falls.  She was doing reasonably well at the time of her visit and denied any chest pain or worsening dyspnea.  No palpitations or dizziness.  She did report decreased appetite and some weight loss lately.  On 05/29/2021, she presented to her PCP with report of UTI.  She had 2 days of darker urine and lower abdominal cramping and urgency, sometimes not making it to the restroom.  Her BP was well controlled on Micardis 40 mg daily and Imdur 60 mg daily.  She continued on Eliquis.  It was noted she had a wound to her left lower extremity when a tree twig rubbed her leg.  At the time of her visit, her leg had a bandage on it.   Recommendation was for Td.    Labs at that time showed: Hypokalemia with potassium 3.3; mild AKI with creatinine 1.5 and BUN 30; sodium and LFTs WNL; BNP 2129.0, HS Tn 16; H&H stable from previous; TSH WNL; UA with positive findings of leukocytes.    Chest x-ray obtained with emphysema and probable small left pleural effusion and left lung base atelectasis.  Pneumonia was not excluded.  PCP recommendation was that patient needed antibiotics.    Urgent referral was recommended to both pulmonology and cardiology.    Pulmonology reviewed the chest x-ray and, when compared to prior films, changes were noted to be similar to that consistent with aging.  Pleural effusion was not noted.  Pneumonia was not noted.  It was noted that there was a prior film with the same findings.  If bronchitis or active infection was suspected, empiric antibiotics were recommended.  It was noted that the patient could be seen in the pulmonologist next available slot if needed.    Today, 06/06/2021, she presents to clinic and notes that she has chest tightness and shortness of breath but not chest pain.  Most of history today is provided by the patient's neighbor, who joins the patient today for her visit.  Per neighbor, the patient has been experiencing shortness of breath and chest tightness for some time now, as well as a slow decline over the last few weeks.  In fact, she states that the patient was reporting some of the symptoms as far back as her previous visit with her primary cardiologist, Dr. Kirke Merritt, on 04/24/2021.  The symptoms have waxed and waned and ultimately progressed to her current presentation.  She has had low oral intake and weight loss over the last few weeks.  Her blood pressure has become more hypotensive with each check and with BP today 92/62.  On review of medications, her neighbor reports that she checks behind the patient to ensure she takes her medicines.  Her morning Eliquis is always gone from her  pillowcase when day and checks in the morning.  However, at night, she sometimes forgets to take her Eliquis.  When Latoya Merritt checks in the morning, she will note that on average there is a missed nighttime dose of Eliquis once per week.  Latoya Merritt does report that she feels an overall decline of the patient has been ongoing for several weeks now.  She also notes that the patient has indicated a preference to avoid the emergency department or any extensive work-up, though Latoya Merritt wishes to involve family/the son in this decision and any assessment and plan today.    During today's visit, O2 saturation initially 92%, but with pulse ox held  on finger, shortly thereafter increased and maintained at 98%.  EKG obtained today with T wave inversion noted in the inferior II, III, aVF leads, as well as precordial leads V4 to V6 (new) with poor R wave progression in lateral 1, aVL and anterior V1, V2, and V3.  Case / ROS / EKG discussed with team and office providers and given ongoing chest tightness and EKG, recommendation was to go to the emergency department for further workup.  Both neighbor and patient expressed some concern regarding exposure to coronavirus.    After further discussion, patient politely declines the emergency department.  Recommendation to obtain a stat troponin provided.  Stat troponin resulted in 16/borderline with recommendation for repeat troponin to ensure flat trending.  Patient preference at this time was to go home with possible collection of high-sensitivity troponin before lab closes at 8 PM unlikely and more likely that the patient will present again tomorrow morning for collection of high-sensitivity troponin.  As below, given soft BP, recommendation was to hold ARB.  Hydration encouraged. Ideally, if patient is able to present to the emergency department in the morning, repeat BMET would be recommended given hypokalemia noted on her labs from 05/29/2021.  Home Medications   Current  Outpatient Medications  Medication Instructions   acetaminophen (TYLENOL) 500 mg, Every 6 hours PRN   brinzolamide (AZOPT) 1 % ophthalmic suspension 1 drop, Left Eye, Daily   carboxymethylcellulose (REFRESH PLUS) 0.5 % SOLN 1 drop, 3 times daily PRN   cyanocobalamin 1,000 mcg, Oral, Daily   doxycycline (VIBRA-TABS) 100 mg, Oral, 2 times daily, With food caution with sun exposure   ELIQUIS 2.5 MG TABS tablet TAKE 1 TABLET BY MOUTH  TWICE DAILY   indapamide (LOZOL) 2.5 MG tablet TAKE 1 TABLET BY MOUTH IN  THE MORNING   ipratropium (ATROVENT) 0.06 % nasal spray 2 sprays, Daily   isosorbide mononitrate (IMDUR) 60 MG 24 hr tablet TAKE 1 TABLET BY MOUTH  DAILY   metroNIDAZOLE (FLAGYL) 500 mg, Oral, 3 times daily, With food   mupirocin ointment (BACTROBAN) 2 % 1 application, Topical, 2 times daily   telmisartan (MICARDIS) 40 MG tablet TAKE 1 TABLET BY MOUTH  DAILY (REPLACES LISINOPRIL)     Review of Systems    As above, ROS provided by neighbor, Latoya Merritt no reported palpitations, pnd, orthopnea, n, v, dizziness, syncope, edema, weight gain, or early satiety.  Reduced oral intake and shortness of breath/dyspnea reported, as well as a feeling of chest tightness that has been ongoing for some time.   All other systems reviewed and are otherwise negative except as noted above.  Physical Exam    VS:  BP 92/62   Pulse 76   Ht 5\' 5"  (1.651 m)   Wt 105 lb 9.6 oz (47.9 kg)   BMI 17.57 kg/m  , BMI Body mass index is 17.57 kg/m. GEN: Cachectic and elderly female, no acute distress, seated in wheelchair, joined by her neighbor. HEENT: normal.  Mucous membranes appear somewhat dry on exam. Neck: Supple, no JVD, carotid bruits, or masses. Cardiac: RRR, no murmurs, rubs, or gallops. No clubbing, cyanosis, edema.  Radials/DP/PT 2+ and equal bilaterally.  Respiratory:  Respirations regular and unlabored, clear to auscultation bilaterally. GI: Soft, nontender, nondistended, BS + x 4. MS: no deformity or  atrophy. Skin: warm and dry, no rash. Neuro:  Strength and sensation are intact. Psych: Normal affect.  Accessory Clinical Findings    ECG personally reviewed by me today -atrial fibrillation with ventricular  rate 76 bpm, T wave inversion in inferior 2, 3, aVF and T wave inversion in anterolateral precordial leads with inversion more pronounced in V4 to V6 than prior EKGs on review of all prior tracings, poor R wave progression in lateral 1, aVF.  VITALS Reviewed today   Temp Readings from Last 3 Encounters:  05/29/21 98.2 F (36.8 C) (Oral)  06/06/20 (!) 96.9 F (36.1 C) (Temporal)  04/06/18 97.6 F (36.4 C) (Oral)   BP Readings from Last 3 Encounters:  06/06/21 92/62  05/29/21 110/70  04/24/21 130/76   Pulse Readings from Last 3 Encounters:  06/06/21 76  05/29/21 76  04/24/21 69    Wt Readings from Last 3 Encounters:  06/06/21 105 lb 9.6 oz (47.9 kg)  05/29/21 106 lb 6.4 oz (48.3 kg)  04/24/21 108 lb (49 kg)     LABS  reviewed today   Lab Results  Component Value Date   WBC 5.5 05/29/2021   HGB 11.3 (L) 05/29/2021   HCT 33.7 (L) 05/29/2021   MCV 96.0 05/29/2021   PLT 159.0 05/29/2021   Lab Results  Component Value Date   CREATININE 1.50 (H) 05/29/2021   BUN 30 (H) 05/29/2021   NA 140 05/29/2021   K 3.3 (L) 05/29/2021   CL 101 05/29/2021   CO2 31 05/29/2021   Lab Results  Component Value Date   ALT 17 05/29/2021   AST 27 05/29/2021   ALKPHOS 43 05/29/2021   BILITOT 1.0 05/29/2021   Lab Results  Component Value Date   CHOL 197 10/23/2012   HDL 61 (H) 10/23/2012   LDLCALC 109 (H) 10/23/2012   TRIG 137 10/23/2012    No results found for: HGBA1C Lab Results  Component Value Date   TSH 1.30 05/29/2021     STUDIES/PROCEDURES reviewed today   CV tab scans  Assessment & Plan    Chest tightness --Reports chest tightness and shortness of breath with details unclear and further detail unable to be provided by patient.  On further questioning,  this has been an ongoing symptom for several weeks, along with reduced oral intake and worsening hypotension and weight loss.  Recently, patient was started on an antibiotic for presumed pneumonia.  On review of EMR, pulmonology reviewed the patient's x-ray and did not find any evidence of effusions or pneumonia/bronchitis as reported in the radiology note.  PCP thus recommended that the patient follow-up with cardiology.  EKG obtained today with diffuse T wave inversion.  Possible etiologies of chest tightness were discussed at length, and including among others lung dz disease, low BP, PE, and coronary insufficiency.  Also discussed was the patient's low potassium and risk for arrhythmia with lower potassium levels, which could result in chest tightness. Given the patient's symptoms and EKG, recommendation was to present to the ED after discussion with the office providers. Patient politely declined due to concern for coronavirus exposure. Stat HST Tn then ordered from medical mall and resulted at 16.  On review of EMR, it now appears previous 6/9 HS Tn also 16. Latoya Merritt does not feel that pt  will be agreeable to trend a second Tn -- initially was to order second HS Tn trend 2h after the fist (lab does not close until 8PM). Preference per pt/neighbor for repeat labs in AM if possible.  Ordered AM [facility] labs. Repeat BMET,Mg, HS Tn --recommended follow-up on previous hypokalemia/renal function. Further recommendations if needed pending labs.  Given hypotension in clinic, pt will hold her ARB  tonight.  If ongoing hypotension tomorrow AM, will recommend holding Imdur as well.  Suspect pt is dehydrated / dry with recommendation for increased oral intake.  Wt and BP down from previous clinic visit.  Latoya Merritt provided with contact information if any questions over the weekend. Reassess in the AM and pending repeat labs. To confirm, pt aware of recommendation to go to the ED at clinic. Alternative plan provided  after pt's politely declined ED and due to concern for covid exposure  Chronic Afib --She reports that she is asymptomatic other than occasionally feeling an irregular pulse if monitoring her pulse via her rest.  No palpitations.  Ventricular rate well controlled without any rate controlling medications.  She continues on anticoagulation without any bleeding complications.  Most recent labs showed stable H&H.  Renal function is stable.  No recent falls.  As above, she occasionally misses an evening dose of anticoagulation on low-dose Eliquis 2.5 mg twice daily.  This occurs on average once per week.  We did discuss that this increases her risk of pulmonary embolism and stroke/thrombotic event.  Given her chest tightness and shortness of breath, oxygen saturation was monitored throughout the entire visit.  We discussed that, if presenting to the ED, pulmonary embolism could also be ruled out though lower suspicion for PE if patient reports compliance and given oxygen saturations remained stable at 98 % throughout the visit. Will recheck CBC, BMET, Mg.   Hypotension, suspect due to hypovolemia -- BP in clinic 92/62.  Hold ARB.  Consider holding Imdur if further rhythm BP needed.  BNP noted to be elevated on 05/29/2021 labs, but on exam, patient does not appear volume up.  Hydration encouraged.  Low-salt diet.  No current plan for diuresis at this time, and given dry appearing exam and soft BP.  Consider that BNP can be elevated for multiple reasons.  ?Pleural effusions on CXR --Per pulmonology reading, CXR without effusions. Suspect low oral intake and hypotension due to hypovolemia. Will have pt hold antihypertensives as above. Hydration encouraged. Pt reportedly started on abx for possible pna not noted by pt - will defer to PCP. Will recheck BNP in AM.  Hypokalemia --Hypokalemia on 3/9 labs and not repleted. Repete labs ordered for over the weekend. Will check BMET, Mg. K goal 4.0, Mg goal  2.0.  ------------------   Medication changes: Hold ARB +/- Imdur (will send MyChart message) Labs ordered (to be obtained if desired by pt): HS Tn x2, BMET / Mg, CBC, BNP Studies / Imaging ordered: None at this time Future considerations: Echo Disposition: RTC pending labs  *Please be aware that the above documentation was completed voice recognition software and may contain dictation errors.    Lennon Alstrom, PA-C 06/06/2021

## 2021-06-09 NOTE — Telephone Encounter (Signed)
Lm for update.  

## 2021-06-10 NOTE — Telephone Encounter (Signed)
Spoke to patient's son, Chris(DPR). OV scheduled for 07/23/2021 at 11;30. Nothing further needed at this time.

## 2021-06-11 ENCOUNTER — Telehealth: Payer: Self-pay | Admitting: Cardiovascular Disease

## 2021-06-11 NOTE — Telephone Encounter (Signed)
Patient son calling  Patient has water on lungs - would like to know if she should start a water pill Please call to discuss - would like to speak with Leafy Kindle if possible

## 2021-06-11 NOTE — Telephone Encounter (Addendum)
DPR on file.  Patient son Latoya Merritt called to provide Latoya Ivan, PA an update on the patient. Latoya Merritt lives in [place]. He is in close communication with the patient and the pt neighbor who helps the pt.  Latoya Merritt sts that the patient has continued to hold her BP meds and imdur (due to low bp).  Patient BP readings 6/20 132/63 6/22 145/65 No HR provided. Nolen Mu to record HR with future BP readings.  Patient denies swelling, orthopnea, pnd. No daily weights provided. She continues to complain of DOE and chest tightness (chest tightness hs been occurring for about 2 months) .Latoya Merritt feels this has been stable since the patient was last seen. Patient had a good day yesterday and was out and about. She complains of feeling a bit more tired and sob today.  Latoya Merritt sts as an FYI that it is challenging getting the patient to appts or the hospital. She has a fear that she is going to end up in a nursing home and wants to stay in her home.  Latoya Merritt sts that Kalida mentioned starting the patient on a diuretic. He would like to know if one can be prescribed.  Nolen Mu that I will fwd the update to [facility] and we will call back with her recommendation. Latoya Merritt is agreeable with the plan and voiced appreciation for the call.

## 2021-06-14 ENCOUNTER — Other Ambulatory Visit: Payer: Self-pay | Admitting: Physician Assistant

## 2021-06-14 DIAGNOSIS — R0609 Other forms of dyspnea: Secondary | ICD-10-CM

## 2021-06-14 DIAGNOSIS — J90 Pleural effusion, not elsewhere classified: Secondary | ICD-10-CM

## 2021-06-14 MED ORDER — POTASSIUM CHLORIDE CRYS ER 10 MEQ PO TBCR: EXTENDED_RELEASE_TABLET | ORAL | 3 refills | Status: DC

## 2021-06-14 MED ORDER — FUROSEMIDE 20 MG PO TABS: 20.0000 mg | ORAL_TABLET | Freq: Every day | ORAL | 3 refills | Status: DC

## 2021-06-14 NOTE — Telephone Encounter (Signed)
Spoke with son regarding starting lasix 20mg  daily with Kcl tab daily. Repeat BMET planned for Tuesday 6/28 in the medical mall. He will continue to hold the Imdur and ARB to allow for room for diuresis at this time. MyChart message sent to confirm plan and verify that the pt should have increased urine output with the fluid pill. Ongoing increased oral intake recommended.

## 2021-06-14 NOTE — Progress Notes (Signed)
Spoke with son today and will start diuresis and potassium supplementation given room in BP on recent checks. He will bring in for labs this upcoming Tuesday morning and (if needed) again before her next clinic visit to monitor closely on diuresis. Telmisartan and Imdur are held at this time to allow for diuresis.

## 2021-06-17 ENCOUNTER — Other Ambulatory Visit: Payer: Self-pay | Admitting: Internal Medicine

## 2021-07-02 ENCOUNTER — Telehealth: Payer: Self-pay

## 2021-07-02 NOTE — Telephone Encounter (Signed)
Pt's son [name] called to see what he needed to do get his mom some home health services to help keep her at home as long as possible. I explained to him that he would need to schedule an appt with Dr. Darrick Huntsman to discuss. Pt has been scheduled for 07/18/2021 at 11:30am.

## 2021-07-03 ENCOUNTER — Ambulatory Visit: Payer: Medicare Other | Admitting: Cardiovascular Disease

## 2021-07-03 ENCOUNTER — Other Ambulatory Visit: Payer: Self-pay

## 2021-07-03 ENCOUNTER — Other Ambulatory Visit
Admission: RE | Admit: 2021-07-03 | Discharge: 2021-07-03 | Disposition: A | Discharge status: Home / Self Care | Payer: Medicare Other | Attending: Physician Assistant | Admitting: Physician Assistant

## 2021-07-03 ENCOUNTER — Encounter: Payer: Self-pay | Admitting: Cardiovascular Disease

## 2021-07-03 VITALS — BP 120/62 | HR 70 | Ht 68.0 in | Wt 103.5 lb

## 2021-07-03 DIAGNOSIS — Z79899 Other long term (current) drug therapy: Secondary | ICD-10-CM | POA: Diagnosis present

## 2021-07-03 DIAGNOSIS — R7989 Other specified abnormal findings of blood chemistry: Secondary | ICD-10-CM | POA: Diagnosis present

## 2021-07-03 DIAGNOSIS — I10 Essential (primary) hypertension: Secondary | ICD-10-CM | POA: Diagnosis not present

## 2021-07-03 DIAGNOSIS — I482 Chronic atrial fibrillation, unspecified: Secondary | ICD-10-CM

## 2021-07-03 DIAGNOSIS — I509 Heart failure, unspecified: Secondary | ICD-10-CM | POA: Insufficient documentation

## 2021-07-03 DIAGNOSIS — R079 Chest pain, unspecified: Secondary | ICD-10-CM | POA: Diagnosis present

## 2021-07-03 DIAGNOSIS — R9431 Abnormal electrocardiogram [ECG] [EKG]: Secondary | ICD-10-CM | POA: Diagnosis present

## 2021-07-03 DIAGNOSIS — I25119 Atherosclerotic heart disease of native coronary artery with unspecified angina pectoris: Secondary | ICD-10-CM

## 2021-07-03 LAB — MAGNESIUM: Magnesium: 1.8 mg/dL (ref 1.7–2.4)

## 2021-07-03 LAB — BASIC METABOLIC PANEL
Anion gap: 8 (ref 5–15)
BUN: 21 mg/dL (ref 8–23)
CO2: 33 mmol/L — ABNORMAL HIGH (ref 22–32)
Calcium: 9.5 mg/dL (ref 8.9–10.3)
Chloride: 98 mmol/L (ref 98–111)
Creatinine, Ser: 1.31 mg/dL — ABNORMAL HIGH (ref 0.44–1.00)
GFR, Estimated: 37 mL/min — ABNORMAL LOW (ref >60)
Glucose, Bld: 128 mg/dL — ABNORMAL HIGH (ref 70–99)
Potassium: 3.6 mmol/L (ref 3.5–5.1)
Sodium: 139 mmol/L (ref 135–145)

## 2021-07-03 LAB — TROPONIN I (HIGH SENSITIVITY): Troponin I (High Sensitivity): 19 ng/L — ABNORMAL HIGH (ref <18)

## 2021-07-03 LAB — BRAIN NATRIURETIC PEPTIDE: B Natriuretic Peptide: 311.1 pg/mL — ABNORMAL HIGH (ref 0.0–100.0)

## 2021-07-03 MED ORDER — ISOSORBIDE MONONITRATE ER 30 MG PO TB24: 30.0000 mg | ORAL_TABLET | Freq: Every day | ORAL | 1 refills | Status: AC

## 2021-07-03 NOTE — Patient Instructions (Signed)
Medication Instructions:  Your physician has recommended you make the following change in your medication:   1) STOP indapamide (Lozol)  2) RESUME isosorbide (imdur) 30 mg daily. An Rx has been ent to your pharmacy.  3) STOP Losartan  Continue your other medications as prescribed.   *If you need a refill on your cardiac medications before your next appointment, please call your pharmacy*   Lab Work: None ordered  If you have labs (blood work) drawn today and your tests are completely normal, you will receive your results only by: MyChart Message (if you have MyChart) OR A paper copy in the mail If you have any lab test that is abnormal or we need to change your treatment, we will call you to review the results.   Testing/Procedures: None ordered   Follow-Up: At [facility], you and your health needs are our priority.  As part of our continuing mission to provide you with exceptional heart care, we have created designated Provider Care Teams.  These Care Teams include your primary Cardiologist (physician) and Advanced Practice Providers (APPs -  Physician Assistants and Nurse Practitioners) who all work together to provide you with the care you need, when you need it.  We recommend signing up for the patient portal called "MyChart".  Sign up information is provided on this After Visit Summary.  MyChart is used to connect with patients for Virtual Visits (Telemedicine).  Patients are able to view lab/test results, encounter notes, upcoming appointments, etc.  Non-urgent messages can be sent to your provider as well.   To learn more about what you can do with MyChart, go to [email or web address].    Your next appointment:   2 month(s)  The format for your next appointment:   In Person  Provider:   You may see Lorine Bears, MD or one of the following Advanced Practice Providers on your designated Care Team:   Nicolasa Ducking, NP Eula Listen, PA-C Marisue Ivan,  PA-C Cadence Fransico Michael, [place]   Other Instructions N/A

## 2021-07-03 NOTE — Progress Notes (Signed)
Cardiology Office Note   Date:  07/03/2021   ID:  Latoya Merritt, DOB [birth year], MRN [id number]  PCP:  Sherlene Shams, MD  Cardiologist:   Lorine Bears, MD   Chief Complaint  Patient presents with   Other    C/o occasional chest pain, sob and edema ankles. Meds reviewed verbally with pt.      History of Present Illness: Latoya Merritt is a 87 y.o. female who presents for regarding chronic atrial fibrillation and recent heart failure. Other medical problems include hyperlipidemia , history of DVT, hypertension, bilateral mastectomy and previous stroke in 2013 while she was temporarily off anticoagulation. She did have a previous non-ST elevation MI with peak troponin 2.5 felt secondary to demand ischemia. No cardiac workup was performed apart from echocardiogram. Echocardiogram showed normal LV systolic function. She is on long-term anticoagulation with Eliquis.  She had a mechanical fall in December 2018 complicated by right hip hematoma but no recent falls.  She was seen in June for chest tightness, shortness of breath and fatigue.  Labs were done and showed stable anemia with a hemoglobin of 11.3, mildly elevated creatinine above baseline at 1.5, elevated proBNP at 2000 and borderline troponin at 16.  The patient was noted to be mildly hypertensive during the visit and she was asked to hold Micardis and Imdur.  Chest x-ray showed small left pleural effusion.  The patient was ultimately started on small dose furosemide 20 mg daily and had some improvement in symptoms overall. Her biggest worry is that she wants to stay home and not be placed anywhere.  Her son Latoya Merritt is visiting from [place] and trying to help with arrangement.  She has a follow-up appointment with Dr. Darrick Huntsman later this month. Reports significant improvement in chest pain.    Past Medical History:  Diagnosis Date   Atrial fibrillation [facility])    Colon polyp 2008   Coronary artery disease    DVT (deep venous  thrombosis) [facility]) May 2007   History of DVT of lower extremity May 2007   Hyperlipidemia    Hypertension    Polyp, stomach    Sensorineural hearing loss 2006   sudden, left ear   Stroke [facility])     Past Surgical History:  Procedure Laterality Date   ABDOMINAL HYSTERECTOMY  1965   APPENDECTOMY  1950   BREAST SURGERY  1972   mastectomies, presumed due to breast CA   LAMINECTOMY  1975   LASER SURGERY FOR VARICOSE VEINS     NASAL POLYP SURGERY  2008   Dr. Jenne Campus, has been horase ever since   THYROID SURGERY  1977   nodule removed   TONSILLECTOMY  1943     Current Outpatient Medications  Medication Sig Dispense Refill   acetaminophen (TYLENOL) 500 MG tablet Take 500 mg by mouth every 6 (six) hours as needed for pain.     brinzolamide (AZOPT) 1 % ophthalmic suspension Place 1 drop into the left eye daily.     carboxymethylcellulose (REFRESH PLUS) 0.5 % SOLN 1 drop 3 (three) times daily as needed.     cyanocobalamin 1000 MCG tablet Take 1,000 mcg by mouth daily.     doxycycline (VIBRA-TABS) 100 MG tablet Take 1 tablet (100 mg total) by mouth 2 (two) times daily. With food caution with sun exposure 14 tablet 0   ELIQUIS 2.5 MG TABS tablet TAKE 1 TABLET BY MOUTH  TWICE DAILY 180 tablet 3   furosemide (LASIX) 20 MG tablet  Take 1 tablet (20 mg total) by mouth daily. 30 tablet 3   indapamide (LOZOL) 2.5 MG tablet TAKE 1 TABLET BY MOUTH IN  THE MORNING 90 tablet 3   ipratropium (ATROVENT) 0.06 % nasal spray Place 2 sprays into the nose daily.     isosorbide mononitrate (IMDUR) 60 MG 24 hr tablet TAKE 1 TABLET BY MOUTH  DAILY 90 tablet 3   metroNIDAZOLE (FLAGYL) 500 MG tablet Take 1 tablet (500 mg total) by mouth 3 (three) times daily. With food 21 tablet 0   mupirocin ointment (BACTROBAN) 2 % Apply 1 application topically 2 (two) times daily. 30 g 0   potassium chloride (KLOR-CON) 10 MEQ tablet Take 1 tablet (10 mEq total) by mouth daily when taking your lasix (fluid pill). 30 tablet 3    telmisartan (MICARDIS) 40 MG tablet TAKE 1 TABLET BY MOUTH  DAILY (REPLACES LISINOPRIL) 90 tablet 1   No current facility-administered medications for this visit.    Allergies:   Amoxicillin-pot clavulanate, Cephalexin, Clarithromycin, Clarithromycin, Clindamycin/lincomycin, Lansoprazole, Macrodantin, Sulfa antibiotics, Tussionex pennkinetic er [hydrocod polst-cpm polst er], Amoxicillin, Cephalexin, Clindamycin, Hydrocodone-chlorpheniramine, Lansoprazole, Lisinopril, Nitrofurantoin, and Other    Social History:  The patient  reports that she has never smoked. She has never used smokeless tobacco. She reports previous alcohol use of about 7.0 standard drinks of alcohol per week. She reports that she does not use drugs.   Family History:  The patient's family history includes Cancer in an other family member; Heart failure in her brother and mother.    ROS:  Please see the history of present illness.   Otherwise, review of systems are positive for none.   All other systems are reviewed and negative.    PHYSICAL EXAM: VS:  BP 120/62 (BP Location: Left Arm, Patient Position: Sitting, Cuff Size: Normal)   Ht 5\' 8"  (1.727 m)   Wt 103 lb 8 oz (46.9 kg)   BMI 15.74 kg/m  , BMI Body mass index is 15.74 kg/m. GEN: Frail older female in no acute distress  HEENT: normal  Neck: no JVD, carotid bruits, or masses Cardiac: Irregularly irregular; no murmurs, rubs, or gallops,no edema  Respiratory:  clear to auscultation bilaterally, normal work of breathing GI: soft, nontender, nondistended, + BS MS: no deformity or atrophy  Skin: warm and dry, no rash Neuro:  Strength and sensation are intact Psych: euthymic mood, full affect   EKG:  EKG is ordered today. The ekg ordered today demonstrates atrial fibrillation with ventricular rate of 70 bpm, minimal LVH, possible anteroseptal infarct and lateral T wave changes suggestive of ischemia.   Recent Labs: 05/29/2021: ALT 17; BUN 30; Creatinine, Ser  1.50; Hemoglobin 11.3; NT-Pro BNP 2,129; Platelets 159.0; Potassium 3.3; Sodium 140; TSH 1.30    Lipid Panel    Component Value Date/Time   CHOL 197 10/23/2012 0400   TRIG 137 10/23/2012 0400   HDL 61 (H) 10/23/2012 0400   VLDL 27 10/23/2012 0400   LDLCALC 109 (H) 10/23/2012 0400      Wt Readings from Last 3 Encounters:  07/03/21 103 lb 8 oz (46.9 kg)  06/06/21 105 lb 9.6 oz (47.9 kg)  05/29/21 106 lb 6.4 oz (48.3 kg)         ASSESSMENT AND PLAN:  1.  Coronary artery disease involving native coronary arteries with recent anginal symptoms: She continues to have intermittent chest pain but she reports improvement overall.  I am going to resume Imdur but at a lower dose of  30 mg daily.  No antiplatelet medication given that she is on anticoagulation with Eliquis. No plans for any ischemic cardiac work-up given her age and frail status.  2.  Chronic atrial fibrillation: Ventricular rate is controlled without medication.  She is tolerating anticoagulation with Eliquis.  3.  Recent heart failure: Unknown ejection fraction.  No need for an echocardiogram.  Volume overload improved with addition of furosemide 20 mg daily but I am going to discontinue indapamide to avoid being on 2 diuretics.  4.  Essential hypertension: She had hypotension recently but that has improved.  I elected to discontinue indapamide today given that she is now on furosemide.  I elected to resume Imdur but at a lower dose of 30 mg daily and will not resume telmisartan at this point.     Disposition:   FU with me in 6 months  Signed,  Lorine Bears, MD  07/03/2021 4:06 PM    [facility]

## 2021-07-04 ENCOUNTER — Telehealth: Payer: Self-pay

## 2021-07-04 NOTE — Telephone Encounter (Signed)
Able to reach pt regarding her recent lab work, provider had a chance to review her results and advised   "Labs show  --Electrolytes on the low side (see below).  --Kidney function improving since starting lasix.  --Troponin from previously ordered labs was collected (lab never cancelled). No chest pain or concerning findings on her office visit today. She should call the office if any concerning symptoms.   Recommendations:  --Take 1 additional KCL tab x four (4) days only.  --Repeat BMET in 2-4 weeks (if possible, can we get this at the 7/29 PCPvisit?)   Per Dr. Kirke Corin  No need to take extra potassium given that we stopped indapamide today." (07/03/2021)  Latoya Merritt verbalized understanding, reports she will continue her current medication regiments and adjustments to meds made at yesterday visit with Dr. Kirke Corin. Dr. Darrick Huntsman will repeat her labs at her up coming visit towards the end of the month and keep appt with Dr. Kirke Corin on 9/16.   She reports has stated drinking a new "Vit-HIT" drink that her son got her yesterday that is a blend of water, juice, tea, and vitamins. Otherwise, all questions or concerns were address and no additional concerns at this time. Agreeable to plan, will call back for anything further.

## 2021-07-18 ENCOUNTER — Encounter: Payer: Self-pay | Admitting: Internal Medicine

## 2021-07-18 ENCOUNTER — Other Ambulatory Visit: Payer: Self-pay

## 2021-07-18 ENCOUNTER — Ambulatory Visit: Payer: Medicare Other | Admitting: Internal Medicine

## 2021-07-18 VITALS — BP 110/62 | HR 70 | Temp 98.4°F | Ht 68.0 in | Wt 105.4 lb

## 2021-07-18 DIAGNOSIS — I25119 Atherosclerotic heart disease of native coronary artery with unspecified angina pectoris: Secondary | ICD-10-CM | POA: Diagnosis not present

## 2021-07-18 DIAGNOSIS — D6869 Other thrombophilia: Secondary | ICD-10-CM

## 2021-07-18 DIAGNOSIS — R634 Abnormal weight loss: Secondary | ICD-10-CM

## 2021-07-18 DIAGNOSIS — J90 Pleural effusion, not elsewhere classified: Secondary | ICD-10-CM

## 2021-07-18 DIAGNOSIS — R2681 Unsteadiness on feet: Secondary | ICD-10-CM | POA: Diagnosis not present

## 2021-07-18 DIAGNOSIS — Z7901 Long term (current) use of anticoagulants: Secondary | ICD-10-CM

## 2021-07-18 DIAGNOSIS — F0150 Vascular dementia without behavioral disturbance: Secondary | ICD-10-CM

## 2021-07-18 MED ORDER — MIRTAZAPINE 7.5 MG PO TABS: 7.5000 mg | ORAL_TABLET | Freq: Every day | ORAL | 1 refills | Status: AC

## 2021-07-18 NOTE — Assessment & Plan Note (Signed)
Secondary to decreased appetite,  Dietary choices and vascular dementia.  Adding remeron to improve appetite 7.5 mg daily

## 2021-07-18 NOTE — Assessment & Plan Note (Signed)
Not noted on exam today.  Agree with d/c pulmonary consult

## 2021-07-18 NOTE — Assessment & Plan Note (Signed)
She is receiving assistance from supportive neighbors  Who provide medication supervision using a pill box.  Home health referral in progress to assess needs at home along with medical social work.  She desires to remain at home and her son Latoya Merritt is in agreement .  Currently I see no reason to dissuade her

## 2021-07-18 NOTE — Patient Instructions (Signed)
Good to see you!  I will order a home health evaluation to see how we can help Padme stay at home safely  I do recommend the COVID booster

## 2021-07-18 NOTE — Progress Notes (Signed)
Subjective:  Patient ID: Latoya Merritt, female    DOB: 14-Jan-1920  Age: 87 y.o. MRN: [id number]  CC: The primary encounter diagnosis was Vascular dementia without behavioral disturbance (HCC). Diagnoses of Coronary artery disease involving native coronary artery of native heart with angina pectoris (HCC), Unsteady gait when walking, Weight loss, abnormal, Pleural effusion, bilateral, Long term current use of anticoagulant therapy, and Acquired thrombophilia (HCC) were also pertinent to this visit.  HPI [name] presents for  follow up on chronic issues.  Last seen June 2021  This visit occurred during the SARS-CoV-2 public health emergency.  Safety protocols were in place, including screening questions prior to the visit, additional usage of staff PPE, and extensive cleaning of exam room while observing appropriate contact time as indicated for disinfecting solutions.    Latoya Merritt is a delightful 87 yr old female with CAD, atrial fibrillation and mild vascular dementia who continues to live alone but has been having increased difficulty managing her medications,  maintaining a healthy weight,  and walking without losing her balance.  She and her son [name] , who lives in [place],  would her to remain at home for as long as possible.  She is no longer driving but has a very attentive neighbor who provides transportation  and nutritional support.   9 lb weight loss since last year. Neighbors [name] and [name]  provide transportation and bring her breakfast and lunch .  She is not able to manage the [employer] Whe[employer]  entree because they only provide frozen meals to her location and she cannot manage a microwave very well . She will not drink Ensure/Boost.  Has a low appetite.  Discussed remeron.  Not sleeping well .  Wakes up early .  The house is equipped with safety rails,  has a platform step from house to garage.  Using a cane . Likes to be outside tending to flowers.   Shower is a walk in    Had a  controlled fall several months ago in the yard ,  but could not get up on her own.  She wears a Water engineer, but her neighbor was home. .   Another light tumble in the bedroom, landed in a chair   Help with shower Weekly meds Assess for ramp  OT    Saw [name] recently,  CHF diagnosed.   Trouble with ambulation .  Needs OT evaluation   Unsteady on feet, left ankle pain .  Has a venous ulcer that is healing   Outpatient Medications Prior to Visit  Medication Sig Dispense Refill   acetaminophen (TYLENOL) 500 MG tablet Take 500 mg by mouth every 6 (six) hours as needed for pain.     brinzolamide (AZOPT) 1 % ophthalmic suspension Place 1 drop into the left eye daily.     carboxymethylcellulose (REFRESH PLUS) 0.5 % SOLN 1 drop 3 (three) times daily as needed.     cyanocobalamin 1000 MCG tablet Take 1,000 mcg by mouth daily.     ELIQUIS 2.5 MG TABS tablet TAKE 1 TABLET BY MOUTH  TWICE DAILY 180 tablet 3   furosemide (LASIX) 20 MG tablet Take 1 tablet (20 mg total) by mouth daily. 30 tablet 3   ipratropium (ATROVENT) 0.06 % nasal spray Place 2 sprays into the nose daily.     isosorbide mononitrate (IMDUR) 30 MG 24 hr tablet Take 1 tablet (30 mg total) by mouth daily. 90 tablet 1   mupirocin ointment (  BACTROBAN) 2 % Apply 1 application topically 2 (two) times daily. 30 g 0   potassium chloride (KLOR-CON) 10 MEQ tablet Take 1 tablet (10 mEq total) by mouth daily when taking your lasix (fluid pill). 30 tablet 3   No facility-administered medications prior to visit.    Review of Systems;  Patient denies headache, fevers, malaise, unintentional weight loss, skin rash, eye pain, sinus congestion and sinus pain, sore throat, dysphagia,  hemoptysis , cough, dyspnea, wheezing, chest pain, palpitations, orthopnea, edema, abdominal pain, nausea, melena, diarrhea, constipation, flank pain, dysuria, hematuria, urinary  Frequency, nocturia, numbness, tingling, seizures,  Focal weakness, Loss  of consciousness,  Tremor, insomnia, depression, anxiety, and suicidal ideation.      Objective:  BP 110/62 (BP Location: Left Arm, Patient Position: Sitting, Cuff Size: Large)   Pulse 70   Temp 98.4 F (36.9 C) (Oral)   Ht 5\' 8"  (1.727 m)   Wt 105 lb 6.4 oz (47.8 kg)   SpO2 96%   BMI 16.03 kg/m   BP Readings from Last 3 Encounters:  07/18/21 110/62  07/03/21 120/62  06/06/21 92/62    Wt Readings from Last 3 Encounters:  07/18/21 105 lb 6.4 oz (47.8 kg)  07/03/21 103 lb 8 oz (46.9 kg)  06/06/21 105 lb 9.6 oz (47.9 kg)    General appearance: alert, cooperative and appears stated age Ears: normal TM's and external ear canals both ears Throat: lips, mucosa, and tongue normal; teeth and gums normal Neck: no adenopathy, no carotid bruit, supple, symmetrical, trachea midline and thyroid not enlarged, symmetric, no tenderness/mass/nodules Back: symmetric, no curvature. ROM normal. No CVA tenderness. Lungs: clear to auscultation bilaterally Heart: regular rate and rhythm, S1, S2 normal, no murmur, click, rub or gallop Abdomen: soft, non-tender; bowel sounds normal; no masses,  no organomegaly Pulses: 2+ and symmetric Skin: Skin color, texture, turgor normal. No rashes or lesions Lymph nodes: Cervical, supraclavicular, and axillary nodes normal.  No results found for: HGBA1C  Lab Results  Component Value Date   CREATININE 1.31 (H) 07/03/2021   CREATININE 1.50 (H) 05/29/2021   CREATININE 1.28 (H) 06/06/2020    Lab Results  Component Value Date   WBC 5.5 05/29/2021   HGB 11.3 (L) 05/29/2021   HCT 33.7 (L) 05/29/2021   PLT 159.0 05/29/2021   GLUCOSE 128 (H) 07/03/2021   CHOL 197 10/23/2012   TRIG 137 10/23/2012   HDL 61 (H) 10/23/2012   LDLCALC 109 (H) 10/23/2012   ALT 17 05/29/2021   AST 27 05/29/2021   NA 139 07/03/2021   K 3.6 07/03/2021   CL 98 07/03/2021   CREATININE 1.31 (H) 07/03/2021   BUN 21 07/03/2021   CO2 33 (H) 07/03/2021   TSH 1.30 05/29/2021    INR 2.2 (H) 02/13/2016    No results found.  Assessment & Plan:   Problem List Items Addressed This Visit       Unprioritized   Vascular dementia without behavioral disturbance (HCC) - Primary    She is receiving assistance from supportive neighbors  Who provide medication supervision using a pill box.  Home health referral in progress to assess needs at home along with medical social work.  She desires to remain at home and her son 02/15/2016 is in agreement .  Currently I see no reason to dissuade her        Relevant Medications   mirtazapine (REMERON) 7.5 MG tablet   Other Relevant Orders   Ambulatory referral to Home Health  Coronary artery disease    She was seen in June  by Dr Kirke Corin for chest tightness.  Noninvasive workup was done: Coronary artery disease involving native coronary arteries with recent anginal symptoms: She continues to have intermittent chest pain but she reports improvement overall. She has been advised to resume Imdur but at a lower dose of 30 mg dail due to hypotension during recent visit.  She is not on  antiplatelet medication given that she is on anticoagulation with Eliquis.  There are No plans for any ischemic cardiac work-up given her age and frail status.       Long term current use of anticoagulant therapy    She has had 2 minor falls in the last several months with no injuries.  The need for continued anticoagulation must be considered  with the risk of excessive bleeding caused by a traumatic fall         Acquired thrombophilia (HCC)    Her embolic stroke risk is managed with Eliquis after suffering a CVA several years ago.    Lab Results  Component Value Date   WBC 5.5 05/29/2021   HGB 11.3 (L) 05/29/2021   HCT 33.7 (L) 05/29/2021   MCV 96.0 05/29/2021   PLT 159.0 05/29/2021         Pleural effusion, bilateral    Not noted on exam today.  Agree with d/c pulmonary consult       Weight loss, abnormal    Secondary to decreased  appetite,  Dietary choices and vascular dementia.  Adding remeron to improve appetite 7.5 mg daily        Other Visit Diagnoses     Unsteady gait when walking       Relevant Orders   Ambulatory referral to Home Health      A total of 40 minutes was spent with patient , her neighbor [name], (caregiver) and her son [name] (by phone) in assessing her current level of care, revieweing recent workup by Dr Shirlee Latch , counseling patient and family on her nutritional needs , reviewing and explaining recent labs and imaging studies done, and coordination of care.   I am having Latoya Merritt start on mirtazapine. I am also having her maintain her ipratropium, carboxymethylcellulose, acetaminophen, Eliquis, brinzolamide, cyanocobalamin, mupirocin ointment, furosemide, potassium chloride, and isosorbide mononitrate.  Meds ordered this encounter  Medications   mirtazapine (REMERON) 7.5 MG tablet    Sig: Take 1 tablet (7.5 mg total) by mouth at bedtime.    Dispense:  90 tablet    Refill:  1    There are no discontinued medications.  Follow-up: Return in about 3 months (around 10/18/2021).   Sherlene Shams, MD

## 2021-07-18 NOTE — Assessment & Plan Note (Signed)
She was seen in June  by Dr Kirke Corin for chest tightness.  Noninvasive workup was done: Coronary artery disease involving native coronary arteries with recent anginal symptoms: She continues to have intermittent chest pain but she reports improvement overall. She has been advised to resume Imdur but at a lower dose of 30 mg dail due to hypotension during recent visit.  She is not on  antiplatelet medication given that she is on anticoagulation with Eliquis.  There are No plans for any ischemic cardiac work-up given her age and frail status.

## 2021-07-18 NOTE — Addendum Note (Signed)
Addended by: Sherlene Shams on: 07/18/2021 02:37 PM   Modules accepted: Orders

## 2021-07-18 NOTE — Assessment & Plan Note (Signed)
Her embolic stroke risk is managed with Eliquis after suffering a CVA several years ago.    Lab Results  Component Value Date   WBC 5.5 05/29/2021   HGB 11.3 (L) 05/29/2021   HCT 33.7 (L) 05/29/2021   MCV 96.0 05/29/2021   PLT 159.0 05/29/2021

## 2021-07-18 NOTE — Assessment & Plan Note (Signed)
She has had 2 minor falls in the last several months with no injuries.  The need for continued anticoagulation must be considered  with the risk of excessive bleeding caused by a traumatic fall

## 2021-07-23 ENCOUNTER — Institutional Professional Consult (permissible substitution): Payer: Medicare Other | Admitting: Pulmonary Disease

## 2021-09-05 ENCOUNTER — Ambulatory Visit: Payer: Medicare Other | Admitting: Cardiovascular Disease

## 2021-09-08 ENCOUNTER — Other Ambulatory Visit: Payer: Self-pay

## 2021-09-08 DIAGNOSIS — R0609 Other forms of dyspnea: Secondary | ICD-10-CM

## 2021-09-08 DIAGNOSIS — R0600 Dyspnea, unspecified: Secondary | ICD-10-CM

## 2021-09-08 MED ORDER — POTASSIUM CHLORIDE CRYS ER 10 MEQ PO TBCR: EXTENDED_RELEASE_TABLET | ORAL | 1 refills | Status: AC

## 2021-09-08 MED ORDER — FUROSEMIDE 20 MG PO TABS: 20.0000 mg | ORAL_TABLET | Freq: Every day | ORAL | 1 refills | Status: AC

## 2021-10-14 ENCOUNTER — Ambulatory Visit: Payer: Medicare Other | Admitting: Cardiovascular Disease

## 2021-10-20 ENCOUNTER — Other Ambulatory Visit: Payer: Self-pay

## 2021-10-20 ENCOUNTER — Emergency Department: Payer: Medicare Other

## 2021-10-20 DIAGNOSIS — I2510 Atherosclerotic heart disease of native coronary artery without angina pectoris: Secondary | ICD-10-CM | POA: Insufficient documentation

## 2021-10-20 DIAGNOSIS — S0181XA Laceration without foreign body of other part of head, initial encounter: Secondary | ICD-10-CM | POA: Insufficient documentation

## 2021-10-20 DIAGNOSIS — S81812A Laceration without foreign body, left lower leg, initial encounter: Secondary | ICD-10-CM | POA: Diagnosis not present

## 2021-10-20 DIAGNOSIS — F0150 Vascular dementia without behavioral disturbance: Secondary | ICD-10-CM | POA: Diagnosis not present

## 2021-10-20 DIAGNOSIS — Z7901 Long term (current) use of anticoagulants: Secondary | ICD-10-CM | POA: Insufficient documentation

## 2021-10-20 DIAGNOSIS — I4891 Unspecified atrial fibrillation: Secondary | ICD-10-CM | POA: Insufficient documentation

## 2021-10-20 DIAGNOSIS — I10 Essential (primary) hypertension: Secondary | ICD-10-CM | POA: Diagnosis not present

## 2021-10-20 DIAGNOSIS — W01198A Fall on same level from slipping, tripping and stumbling with subsequent striking against other object, initial encounter: Secondary | ICD-10-CM | POA: Diagnosis not present

## 2021-10-20 DIAGNOSIS — Z79899 Other long term (current) drug therapy: Secondary | ICD-10-CM | POA: Diagnosis not present

## 2021-10-20 NOTE — ED Triage Notes (Signed)
Pt fell today raking leaves. Has small lac on back of head. On eliquis.  Has lac on back of l leg dressed ems.

## 2021-10-21 ENCOUNTER — Ambulatory Visit: Payer: Medicare Other | Admitting: Internal Medicine

## 2021-10-21 ENCOUNTER — Emergency Department
Admission: EM | Admit: 2021-10-21 | Discharge: 2021-10-21 | Disposition: A | Discharge status: Home / Self Care | Payer: Medicare Other | Attending: Emergency Medicine | Admitting: Emergency Medicine

## 2021-10-21 DIAGNOSIS — S81812A Laceration without foreign body, left lower leg, initial encounter: Secondary | ICD-10-CM

## 2021-10-21 DIAGNOSIS — W19XXXA Unspecified fall, initial encounter: Secondary | ICD-10-CM

## 2021-10-21 DIAGNOSIS — S0990XA Unspecified injury of head, initial encounter: Secondary | ICD-10-CM

## 2021-10-21 MED ORDER — LIDOCAINE HCL (PF) 1 % IJ SOLN
5.0000 mL | Freq: Once | INTRAMUSCULAR | Status: AC
  Administered 2021-10-21: 5 mL
  Filled 2021-10-21: qty 5

## 2021-10-21 MED ORDER — LIDOCAINE HCL (PF) 1 % IJ SOLN
INTRAMUSCULAR | Status: AC
  Filled 2021-10-21: qty 5

## 2021-10-21 NOTE — ED Provider Notes (Signed)
[facility] Emergency Department Provider Note  Time seen: 10:14 AM  I have reviewed the triage vital signs and the nursing notes.   HISTORY  Chief Complaint Fall (Head lac, leg lac, on eliquis)   HPI Latoya Merritt is a 87 y.o. female with a past medical history of atrial fibrillation on Eliquis, DVT, hypertension, hyperlipidemia, presents to the emergency department after an accidental fall.  According to the patient she was in the garage when she fell backwards, hitting her leg on a brick wall and her head on the back of the wall.  Patient has a cut to her left lower extremity as well as a small cut to the back of the head.  No LOC.  No vomiting.  Patient denies any headache.   Past Medical History:  Diagnosis Date   Atrial fibrillation [facility])    Colon polyp 2008   Coronary artery disease    DVT (deep venous thrombosis) (HCC) May 2007   History of DVT of lower extremity May 2007   Hyperlipidemia    Hypertension    Polyp, stomach    Sensorineural hearing loss 2006   sudden, left ear   Stroke [facility])     Patient Active Problem List   Diagnosis Date Noted   Weight loss, abnormal 07/18/2021   Pulmonary emphysema (HCC) 06/04/2021   Pleural effusion, bilateral 06/04/2021   Cardiomegaly 06/04/2021   Acquired thrombophilia (HCC) 06/09/2020   Dyspnea on exertion 06/09/2020   Myalgia due to statin 06/09/2020   Posterior dislocation of IOL (intraocular lens) 04/08/2018   Routine adult health maintenance 04/10/2017   Advance directive discussed with patient 04/10/2017   B12 deficiency anemia 08/01/2016   Long term current use of anticoagulant therapy 10/11/2014   Urinary frequency 08/09/2014   History of non-ST elevation myocardial infarction (NSTEMI) 06/11/2013   Bradycardia, sinus 12/22/2012   Coronary artery disease    Vascular dementia without behavioral disturbance (HCC) 12/31/2011   History of CVA (cerebrovascular accident) 12/31/2011    Hyperlipidemia    Hypertension    Colon polyp    History of DVT of lower extremity    Atrial fibrillation (HCC) 08/27/2010   CHEST PAIN 08/27/2010   ABNORMAL CV (STRESS) TEST 08/27/2010   History of DVT (deep vein thrombosis) 04/20/2006    Past Surgical History:  Procedure Laterality Date   ABDOMINAL HYSTERECTOMY  1965   APPENDECTOMY  1950   BREAST SURGERY  1972   mastectomies, presumed due to breast CA   LAMINECTOMY  1975   LASER SURGERY FOR VARICOSE VEINS     NASAL POLYP SURGERY  2008   Dr. Jenne Campus, has been horase ever since   THYROID SURGERY  1977   nodule removed   TONSILLECTOMY  1943    Prior to Admission medications   Medication Sig Start Date End Date Taking? Authorizing Provider  acetaminophen (TYLENOL) 500 MG tablet Take 500 mg by mouth every 6 (six) hours as needed for pain.    [provider]  brinzolamide (AZOPT) 1 % ophthalmic suspension Place 1 drop into the left eye daily.    [provider]  carboxymethylcellulose (REFRESH PLUS) 0.5 % SOLN 1 drop 3 (three) times daily as needed.    [provider]  cyanocobalamin 1000 MCG tablet Take 1,000 mcg by mouth daily.    [provider]  ELIQUIS 2.5 MG TABS tablet TAKE 1 TABLET BY MOUTH  TWICE DAILY 11/29/20   Iran Ouch, MD  furosemide (LASIX) 20  MG tablet Take 1 tablet (20 mg total) by mouth daily. 09/08/21 09/08/22  Marisue Ivan D, PA-C  ipratropium (ATROVENT) 0.06 % nasal spray Place 2 sprays into the nose daily.    [provider]  isosorbide mononitrate (IMDUR) 30 MG 24 hr tablet Take 1 tablet (30 mg total) by mouth daily. 07/03/21   Iran Ouch, MD  mirtazapine (REMERON) 7.5 MG tablet Take 1 tablet (7.5 mg total) by mouth at bedtime. 07/18/21   Sherlene Shams, MD  mupirocin ointment (BACTROBAN) 2 % Apply 1 application topically 2 (two) times daily. 05/29/21   McLean-Scocuzza, Pasty Spillers, MD  potassium chloride (KLOR-CON) 10 MEQ tablet Take 1 tablet (10 mEq  total) by mouth daily when taking your lasix (fluid pill). 09/08/21   Marisue Ivan D, PA-C    Allergies  Allergen Reactions   Amoxicillin-Pot Clavulanate     Swelling & rash   Cephalexin    Clarithromycin     rash   Clarithromycin Hives    rash   Clindamycin/Lincomycin     rash   Lansoprazole    Macrodantin    Sulfa Antibiotics    Tussionex Pennkinetic Er [Hydrocod Polst-Cpm Polst Er]     Rash across her chest and side   Amoxicillin Rash    rash   Cephalexin Rash    rash   Clindamycin Rash   Hydrocodone-Chlorpheniramine Rash    Rash across her chest and side   Lansoprazole Rash   Lisinopril Rash    Bad dreams  Bad dreams    Nitrofurantoin Rash   Other Rash    Family History  Problem Relation Age of Onset   Heart failure Mother    Heart failure Brother    Cancer Other     Social History Social History   Tobacco Use   Smoking status: Never   Smokeless tobacco: Never  Vaping Use   Vaping Use: Never used  Substance Use Topics   Alcohol use: Not Currently    Alcohol/week: 7.0 standard drinks    Types: 7 Glasses of wine per week    Comment: glass a wine at bedtime   Drug use: No    Review of Systems Constitutional: Negative for LOC. Cardiovascular: Negative for chest pain. Respiratory: Negative for shortness of breath. Gastrointestinal: Negative for abdominal pain Musculoskeletal: Left leg pain/cut Skin: Laceration to the left calf, cut to the occipital scalp Neurological: Negative for headache All other ROS negative  ____________________________________________   PHYSICAL EXAM:  VITAL SIGNS: ED Triage Vitals  Enc Vitals Group     BP 10/20/21 1949 (!) 144/100     Pulse Rate 10/20/21 1949 87     Resp 10/21/21 0000 18     Temp 10/20/21 1949 98.2 F (36.8 C)     Temp Source 10/20/21 1949 Oral     SpO2 10/20/21 1949 99 %     Weight --      Height --      Head Circumference --      Peak Flow --      Pain Score --      Pain Loc --       Pain Edu? --      Excl. in GC? --     Constitutional: Awake alert, well-appearing, appears younger than stated age. Eyes: Normal exam ENT      Head: Small laceration approximately 1 cm to the occipital scalp, hemostatic      Mouth/Throat: Mucous membranes are moist. Cardiovascular: Normal  rate, regular rhythm. Respiratory: Normal respiratory effort without tachypnea nor retractions. Breath sounds are clear Gastrointestinal: Soft and nontender. No distention.   Musculoskeletal: Patient has an approximate 10 cm laceration into the subcutaneous tissue of her left lower extremity/left lateral calf, currently hemostatic Neurologic:  Normal speech and language. No gross focal neurologic deficits Skin:  Skin is warm, dry and intact.  Psychiatric: Mood and affect are normal.   ____________________________________________   RADIOLOGY  CT scan of the head is negative for intracranial abnormality.  CT C-spine negative.  ____________________________________________   INITIAL IMPRESSION / ASSESSMENT AND PLAN / ED COURSE  Pertinent labs & imaging results that were available during my care of the patient were reviewed by me and considered in my medical decision making (see chart for details).   Patient presents emergency department after accidental fall, hit her head with a small scalp laceration, larger left leg laceration will need repair.  CT scan of the head is negative for acute abnormality.  Patient appears very well.  Small 1 cm laceration to occipital scalp is hemostatic, does not require repair.  Laceration repaired with 4 deep rapid Vicryl sutures.  Laceration was irrigated extensively.  And skin closed with Steri-Strips.  LACERATION REPAIR Performed by: Minna Antis Authorized by: Minna Antis Consent: Verbal consent obtained. Risks and benefits: risks, benefits and alternatives were discussed Consent given by: patient Patient identity confirmed: provided demographic  data Prepped and Draped in normal sterile fashion Wound explored  Laceration Location: Left leg  Laceration Length: 10 cm  No Foreign Bodies seen or palpated  Anesthesia: local infiltration  Local anesthetic: lidocaine 1% without epinephrine  Anesthetic total: 8 ml  Irrigation method: syringe Amount of cleaning: standard  Subcutaneous/deep closure: 4-0 rapid Vicryl  Number of sutures: 4  Technique: Simple interrupted  Patient tolerance: Patient tolerated the procedure well with no immediate complications.   Rosalva EBELYN BOHNET was evaluated in Emergency Department on 10/21/2021 for the symptoms described in the history of present illness. She was evaluated in the context of the global COVID-19 pandemic, which necessitated consideration that the patient might be at risk for infection with the SARS-CoV-2 virus that causes COVID-19. Institutional protocols and algorithms that pertain to the evaluation of patients at risk for COVID-19 are in a state of rapid change based on information released by regulatory bodies including the CDC and federal and state organizations. These policies and algorithms were followed during the patient's care in the ED.  ____________________________________________   FINAL CLINICAL IMPRESSION(S) / ED DIAGNOSES  Laceration Head injury   Minna Antis, MD 10/21/21 1018

## 2021-10-21 NOTE — ED Notes (Signed)
ED Provider at bedside. 

## 2021-10-21 NOTE — Discharge Instructions (Addendum)
As we discussed please keep your scalp dry for the next 24 hours then you may wash with mild soap and allowed to air dry.  Please keep your lower extremity dry for the next 48 hours, after which you may wash with mild soap and allowed to air dry.  Please follow-up with your doctor in 2 to 3 days for recheck/reevaluation of your laceration.  Return to the emergency department for any symptom personally concerning to yourself.

## 2021-10-24 ENCOUNTER — Other Ambulatory Visit: Payer: Self-pay

## 2021-10-24 ENCOUNTER — Telehealth (INDEPENDENT_AMBULATORY_CARE_PROVIDER_SITE_OTHER): Payer: Medicare Other | Admitting: Nurse Practitioner

## 2021-10-24 DIAGNOSIS — R0989 Other specified symptoms and signs involving the circulatory and respiratory systems: Secondary | ICD-10-CM | POA: Diagnosis not present

## 2021-10-24 DIAGNOSIS — R051 Acute cough: Secondary | ICD-10-CM | POA: Diagnosis not present

## 2021-10-24 DIAGNOSIS — J069 Acute upper respiratory infection, unspecified: Secondary | ICD-10-CM

## 2021-10-24 MED ORDER — GUAIFENESIN ER 600 MG PO TB12: 600.0000 mg | ORAL_TABLET | Freq: Two times a day (BID) | ORAL | 0 refills | Status: AC

## 2021-10-24 NOTE — Assessment & Plan Note (Signed)
We will send in some Mucinex to help thin out any secretions.  Did discuss water intake in order for medication to be effective.  Son acknowledged continue to monitor

## 2021-10-24 NOTE — Progress Notes (Signed)
Patient ID: Latoya Merritt, female    DOB: 10/07/1920, 87 y.o.   MRN: [id number]  Virtual visit completed through [employer], a video enabled telemedicine application. Due to national recommendations of social distancing due to COVID-19, a virtual visit is felt to be most appropriate for this patient at this time. Reviewed limitations, risks, security and privacy concerns of performing a virtual visit and the availability of in person appointments. I also reviewed that there may be a patient responsible charge related to this service. The patient agreed to proceed.   Attempted to connect via a video enabled program. Without succuss we reverted to a telephone encounter.  Patient location: home Provider location: [place] at [facility], office Persons participating in this virtual visit: patient, provider, and patient's son   If any vitals were documented, they were collected by patient at home unless specified below.    There were no vitals taken for this visit.   CC: Cough Subjective:   HPI: Latoya Merritt is a 87 y.o. female presenting on 10/24/2021 for Cough (Started about 10/21/21. Cough-white phlegm, chest congestion, some chest tightness but has CHF, voice hoarse, runny nose-but chronic. Covid test negative on 10/23/21)    Symptoms started on 10/21/2021 Covid test on 10/23/2021 at home test Vaccinated moderna x2 No otc treatment Has had a sick contact at the ED Coughing has decreased    Relevant past medical, surgical, family and social history reviewed and updated as indicated. Interim medical history since our last visit reviewed. Allergies and medications reviewed and updated. Outpatient Medications Prior to Visit  Medication Sig Dispense Refill   acetaminophen (TYLENOL) 500 MG tablet Take 500 mg by mouth every 6 (six) hours as needed for pain.     brinzolamide (AZOPT) 1 % ophthalmic suspension Place 1 drop into the left eye daily.     carboxymethylcellulose (REFRESH  PLUS) 0.5 % SOLN 1 drop 3 (three) times daily as needed.     cyanocobalamin 1000 MCG tablet Take 1,000 mcg by mouth daily.     ELIQUIS 2.5 MG TABS tablet TAKE 1 TABLET BY MOUTH  TWICE DAILY 180 tablet 3   furosemide (LASIX) 20 MG tablet Take 1 tablet (20 mg total) by mouth daily. 30 tablet 1   ipratropium (ATROVENT) 0.06 % nasal spray Place 2 sprays into the nose daily.     isosorbide mononitrate (IMDUR) 30 MG 24 hr tablet Take 1 tablet (30 mg total) by mouth daily. 90 tablet 1   mirtazapine (REMERON) 7.5 MG tablet Take 1 tablet (7.5 mg total) by mouth at bedtime. 90 tablet 1   mupirocin ointment (BACTROBAN) 2 % Apply 1 application topically 2 (two) times daily. 30 g 0   potassium chloride (KLOR-CON) 10 MEQ tablet Take 1 tablet (10 mEq total) by mouth daily when taking your lasix (fluid pill). 30 tablet 1   No facility-administered medications prior to visit.     Per HPI unless specifically indicated in ROS section below Review of Systems  Constitutional:  Negative for chills, fatigue and fever.  HENT:  Positive for rhinorrhea (chronic). Negative for sore throat.   Respiratory:  Positive for cough (clear foamy) and shortness of breath (increased from baseline).   Cardiovascular:  Positive for chest pain (chest tightness).  Gastrointestinal:  Positive for nausea. Negative for abdominal pain, diarrhea and vomiting.  Musculoskeletal:  Negative for arthralgias and myalgias.  Neurological:  Positive for headaches.  Objective:  There were no vitals taken for this visit.  Wt  Readings from Last 3 Encounters:  07/18/21 105 lb 6.4 oz (47.8 kg)  07/03/21 103 lb 8 oz (46.9 kg)  06/06/21 105 lb 9.6 oz (47.9 kg)       Physical exam: Gen: alert, NAD, not ill appearing Pulm: speaks in complete sentences without increased work of breathing Psych: normal mood, normal thought content      Results for orders placed or performed during the hospital encounter of 07/03/21  Magnesium  Result Value Ref  Range   Magnesium 1.8 1.7 - 2.4 mg/dL  Basic Metabolic Panel (BMET)  Result Value Ref Range   Sodium 139 135 - 145 mmol/L   Potassium 3.6 3.5 - 5.1 mmol/L   Chloride 98 98 - 111 mmol/L   CO2 33 (H) 22 - 32 mmol/L   Glucose, Bld 128 (H) 70 - 99 mg/dL   BUN 21 8 - 23 mg/dL   Creatinine, Ser 8.75 (H) 0.44 - 1.00 mg/dL   Calcium 9.5 8.9 - 64.3 mg/dL   GFR, Estimated 37 (L) >60 mL/min   Anion gap 8 5 - 15  Brain natriuretic peptide  Result Value Ref Range   B Natriuretic Peptide 311.1 (H) 0.0 - 100.0 pg/mL  Troponin I (High Sensitivity)  Result Value Ref Range   Troponin I (High Sensitivity) 19 (H) <18 ng/L   Assessment & Plan:   Problem List Items Addressed This Visit       Respiratory   Chest congestion    We will send in some Mucinex to help thin out any secretions.  Did discuss water intake in order for medication to be effective.  Son acknowledged continue to monitor      Relevant Medications   guaiFENesin (MUCINEX) 600 MG 12 hr tablet   Upper respiratory tract infection    Given symptoms and length of time sound viral in nature.  Patient signed states actually some better than yesterday.  Continue supportive treatment continue to monitor        Other   Acute cough - Primary     No orders of the defined types were placed in this encounter.  No orders of the defined types were placed in this encounter.  Phone call was 14 minutes and 24 seconds in length  I discussed the assessment and treatment plan with the patient. The patient was provided an opportunity to ask questions and all were answered. The patient agreed with the plan and demonstrated an understanding of the instructions. The patient was advised to call back or seek an in-person evaluation if the symptoms worsen or if the condition fails to improve as anticipated.  Follow up plan: No follow-ups on file.  Audria Nine, NP

## 2021-10-24 NOTE — Assessment & Plan Note (Signed)
Given symptoms and length of time sound viral in nature.  Patient signed states actually some better than yesterday.  Continue supportive treatment continue to monitor

## 2021-10-27 ENCOUNTER — Inpatient Hospital Stay (HOSPITAL_COMMUNITY): Payer: Medicare Other

## 2021-10-27 ENCOUNTER — Emergency Department (HOSPITAL_COMMUNITY): Payer: Medicare Other | Admitting: Certified Registered Nurse Anesthetist

## 2021-10-27 ENCOUNTER — Inpatient Hospital Stay (HOSPITAL_COMMUNITY)
Admission: EM | Admit: 2021-10-27 | Discharge: 2021-11-20 | DRG: 023 | Disposition: E | Payer: Medicare Other | Attending: Neurology | Admitting: Neurology

## 2021-10-27 ENCOUNTER — Emergency Department (HOSPITAL_COMMUNITY): Payer: Medicare Other

## 2021-10-27 ENCOUNTER — Other Ambulatory Visit: Payer: Self-pay

## 2021-10-27 ENCOUNTER — Encounter (HOSPITAL_COMMUNITY): Payer: Self-pay | Admitting: Emergency Medicine

## 2021-10-27 ENCOUNTER — Encounter (HOSPITAL_COMMUNITY): Admission: EM | Disposition: E | Payer: Self-pay | Source: Home / Self Care | Attending: Neurology

## 2021-10-27 DIAGNOSIS — R4701 Aphasia: Secondary | ICD-10-CM | POA: Diagnosis present

## 2021-10-27 DIAGNOSIS — Z20822 Contact with and (suspected) exposure to covid-19: Secondary | ICD-10-CM | POA: Diagnosis present

## 2021-10-27 DIAGNOSIS — G8191 Hemiplegia, unspecified affecting right dominant side: Secondary | ICD-10-CM | POA: Diagnosis present

## 2021-10-27 DIAGNOSIS — E785 Hyperlipidemia, unspecified: Secondary | ICD-10-CM | POA: Diagnosis present

## 2021-10-27 DIAGNOSIS — I482 Chronic atrial fibrillation, unspecified: Secondary | ICD-10-CM | POA: Diagnosis present

## 2021-10-27 DIAGNOSIS — R29810 Facial weakness: Secondary | ICD-10-CM | POA: Diagnosis present

## 2021-10-27 DIAGNOSIS — R54 Age-related physical debility: Secondary | ICD-10-CM | POA: Diagnosis present

## 2021-10-27 DIAGNOSIS — J9601 Acute respiratory failure with hypoxia: Secondary | ICD-10-CM | POA: Diagnosis not present

## 2021-10-27 DIAGNOSIS — Z9013 Acquired absence of bilateral breasts and nipples: Secondary | ICD-10-CM

## 2021-10-27 DIAGNOSIS — Z66 Do not resuscitate: Secondary | ICD-10-CM | POA: Diagnosis present

## 2021-10-27 DIAGNOSIS — H518 Other specified disorders of binocular movement: Secondary | ICD-10-CM | POA: Diagnosis present

## 2021-10-27 DIAGNOSIS — Z882 Allergy status to sulfonamides status: Secondary | ICD-10-CM

## 2021-10-27 DIAGNOSIS — Z881 Allergy status to other antibiotic agents status: Secondary | ICD-10-CM

## 2021-10-27 DIAGNOSIS — I6602 Occlusion and stenosis of left middle cerebral artery: Secondary | ICD-10-CM | POA: Diagnosis present

## 2021-10-27 DIAGNOSIS — Z515 Encounter for palliative care: Secondary | ICD-10-CM

## 2021-10-27 DIAGNOSIS — E876 Hypokalemia: Secondary | ICD-10-CM | POA: Diagnosis present

## 2021-10-27 DIAGNOSIS — I252 Old myocardial infarction: Secondary | ICD-10-CM

## 2021-10-27 DIAGNOSIS — Z7901 Long term (current) use of anticoagulants: Secondary | ICD-10-CM

## 2021-10-27 DIAGNOSIS — F0150 Vascular dementia without behavioral disturbance: Secondary | ICD-10-CM | POA: Diagnosis present

## 2021-10-27 DIAGNOSIS — W1830XA Fall on same level, unspecified, initial encounter: Secondary | ICD-10-CM | POA: Diagnosis present

## 2021-10-27 DIAGNOSIS — Z90710 Acquired absence of both cervix and uterus: Secondary | ICD-10-CM

## 2021-10-27 DIAGNOSIS — S0003XA Contusion of scalp, initial encounter: Secondary | ICD-10-CM | POA: Diagnosis present

## 2021-10-27 DIAGNOSIS — H905 Unspecified sensorineural hearing loss: Secondary | ICD-10-CM | POA: Diagnosis present

## 2021-10-27 DIAGNOSIS — Z8249 Family history of ischemic heart disease and other diseases of the circulatory system: Secondary | ICD-10-CM

## 2021-10-27 DIAGNOSIS — I2510 Atherosclerotic heart disease of native coronary artery without angina pectoris: Secondary | ICD-10-CM | POA: Diagnosis present

## 2021-10-27 DIAGNOSIS — I63312 Cerebral infarction due to thrombosis of left middle cerebral artery: Secondary | ICD-10-CM | POA: Diagnosis not present

## 2021-10-27 DIAGNOSIS — E1122 Type 2 diabetes mellitus with diabetic chronic kidney disease: Secondary | ICD-10-CM | POA: Diagnosis present

## 2021-10-27 DIAGNOSIS — N1832 Chronic kidney disease, stage 3b: Secondary | ICD-10-CM | POA: Diagnosis present

## 2021-10-27 DIAGNOSIS — R29726 NIHSS score 26: Secondary | ICD-10-CM | POA: Diagnosis present

## 2021-10-27 DIAGNOSIS — I63412 Cerebral infarction due to embolism of left middle cerebral artery: Principal | ICD-10-CM | POA: Diagnosis present

## 2021-10-27 DIAGNOSIS — R414 Neurologic neglect syndrome: Secondary | ICD-10-CM | POA: Diagnosis present

## 2021-10-27 DIAGNOSIS — I129 Hypertensive chronic kidney disease with stage 1 through stage 4 chronic kidney disease, or unspecified chronic kidney disease: Secondary | ICD-10-CM | POA: Diagnosis present

## 2021-10-27 DIAGNOSIS — R0902 Hypoxemia: Secondary | ICD-10-CM | POA: Diagnosis not present

## 2021-10-27 DIAGNOSIS — D6869 Other thrombophilia: Secondary | ICD-10-CM | POA: Diagnosis present

## 2021-10-27 DIAGNOSIS — Z885 Allergy status to narcotic agent status: Secondary | ICD-10-CM

## 2021-10-27 DIAGNOSIS — Z8673 Personal history of transient ischemic attack (TIA), and cerebral infarction without residual deficits: Secondary | ICD-10-CM

## 2021-10-27 DIAGNOSIS — I639 Cerebral infarction, unspecified: Secondary | ICD-10-CM | POA: Diagnosis present

## 2021-10-27 DIAGNOSIS — D519 Vitamin B12 deficiency anemia, unspecified: Secondary | ICD-10-CM | POA: Diagnosis present

## 2021-10-27 DIAGNOSIS — N183 Chronic kidney disease, stage 3 unspecified: Secondary | ICD-10-CM | POA: Diagnosis present

## 2021-10-27 DIAGNOSIS — I6389 Other cerebral infarction: Secondary | ICD-10-CM | POA: Diagnosis not present

## 2021-10-27 DIAGNOSIS — Z853 Personal history of malignant neoplasm of breast: Secondary | ICD-10-CM

## 2021-10-27 DIAGNOSIS — Z79899 Other long term (current) drug therapy: Secondary | ICD-10-CM

## 2021-10-27 DIAGNOSIS — N179 Acute kidney failure, unspecified: Secondary | ICD-10-CM | POA: Diagnosis not present

## 2021-10-27 DIAGNOSIS — Z888 Allergy status to other drugs, medicaments and biological substances status: Secondary | ICD-10-CM

## 2021-10-27 DIAGNOSIS — Z86718 Personal history of other venous thrombosis and embolism: Secondary | ICD-10-CM

## 2021-10-27 HISTORY — PX: RADIOLOGY WITH ANESTHESIA: SHX6223

## 2021-10-27 HISTORY — PX: IR PERCUTANEOUS ART THROMBECTOMY/INFUSION INTRACRANIAL INC DIAG ANGIO: IMG6087

## 2021-10-27 HISTORY — PX: IR CT HEAD LTD: IMG2386

## 2021-10-27 LAB — DIFFERENTIAL
Abs Immature Granulocytes: 0.02 10*3/uL (ref 0.00–0.07)
Basophils Absolute: 0.1 10*3/uL (ref 0.0–0.1)
Basophils Relative: 1 %
Eosinophils Absolute: 0.2 10*3/uL (ref 0.0–0.5)
Eosinophils Relative: 2 %
Immature Granulocytes: 0 %
Lymphocytes Relative: 30 %
Lymphs Abs: 2.2 10*3/uL (ref 0.7–4.0)
Monocytes Absolute: 0.6 10*3/uL (ref 0.1–1.0)
Monocytes Relative: 8 %
Neutro Abs: 4.4 10*3/uL (ref 1.7–7.7)
Neutrophils Relative %: 59 %

## 2021-10-27 LAB — CBC
HCT: 32.7 % — ABNORMAL LOW (ref 36.0–46.0)
Hemoglobin: 11 g/dL — ABNORMAL LOW (ref 12.0–15.0)
MCH: 32.1 pg (ref 26.0–34.0)
MCHC: 33.6 g/dL (ref 30.0–36.0)
MCV: 95.3 fL (ref 80.0–100.0)
Platelets: 175 10*3/uL (ref 150–400)
RBC: 3.43 MIL/uL — ABNORMAL LOW (ref 3.87–5.11)
RDW: 13 % (ref 11.5–15.5)
WBC: 7.4 10*3/uL (ref 4.0–10.5)
nRBC: 0 % (ref 0.0–0.2)

## 2021-10-27 LAB — CBG MONITORING, ED: Glucose-Capillary: 115 mg/dL — ABNORMAL HIGH (ref 70–99)

## 2021-10-27 LAB — COMPREHENSIVE METABOLIC PANEL
ALT: 25 U/L (ref 0–44)
AST: 33 U/L (ref 15–41)
Albumin: 3.1 g/dL — ABNORMAL LOW (ref 3.5–5.0)
Alkaline Phosphatase: 58 U/L (ref 38–126)
Anion gap: 11 (ref 5–15)
BUN: 24 mg/dL — ABNORMAL HIGH (ref 8–23)
CO2: 27 mmol/L (ref 22–32)
Calcium: 8.9 mg/dL (ref 8.9–10.3)
Chloride: 98 mmol/L (ref 98–111)
Creatinine, Ser: 1.41 mg/dL — ABNORMAL HIGH (ref 0.44–1.00)
GFR, Estimated: 34 mL/min — ABNORMAL LOW (ref >60)
Glucose, Bld: 138 mg/dL — ABNORMAL HIGH (ref 70–99)
Potassium: 3.2 mmol/L — ABNORMAL LOW (ref 3.5–5.1)
Sodium: 136 mmol/L (ref 135–145)
Total Bilirubin: 1.4 mg/dL — ABNORMAL HIGH (ref 0.3–1.2)
Total Protein: 6.4 g/dL — ABNORMAL LOW (ref 6.5–8.1)

## 2021-10-27 LAB — I-STAT CHEM 8, ED
BUN: 25 mg/dL — ABNORMAL HIGH (ref 8–23)
Calcium, Ion: 1.15 mmol/L (ref 1.15–1.40)
Chloride: 97 mmol/L — ABNORMAL LOW (ref 98–111)
Creatinine, Ser: 1.3 mg/dL — ABNORMAL HIGH (ref 0.44–1.00)
Glucose, Bld: 129 mg/dL — ABNORMAL HIGH (ref 70–99)
HCT: 33 % — ABNORMAL LOW (ref 36.0–46.0)
Hemoglobin: 11.2 g/dL — ABNORMAL LOW (ref 12.0–15.0)
Potassium: 3.1 mmol/L — ABNORMAL LOW (ref 3.5–5.1)
Sodium: 137 mmol/L (ref 135–145)
TCO2: 28 mmol/L (ref 22–32)

## 2021-10-27 LAB — ECHOCARDIOGRAM COMPLETE
Area-P 1/2: 5.93 cm2
S' Lateral: 1.58 cm

## 2021-10-27 LAB — RESP PANEL BY RT-PCR (FLU A&B, COVID) ARPGX2
Influenza A by PCR: NEGATIVE
Influenza B by PCR: NEGATIVE
SARS Coronavirus 2 by RT PCR: NEGATIVE

## 2021-10-27 LAB — APTT: aPTT: 26 seconds (ref 24–36)

## 2021-10-27 LAB — PROTIME-INR
INR: 1.1 (ref 0.8–1.2)
Prothrombin Time: 14.2 seconds (ref 11.4–15.2)

## 2021-10-27 LAB — ETHANOL: Alcohol, Ethyl (B): <10 mg/dL (ref <10)

## 2021-10-27 SURGERY — IR WITH ANESTHESIA
Anesthesia: General

## 2021-10-27 MED ORDER — POTASSIUM CHLORIDE 10 MEQ/100ML IV SOLN: 10.0000 meq | INTRAVENOUS | Status: AC

## 2021-10-27 MED ORDER — VANCOMYCIN HCL 1000 MG IV SOLR
INTRAVENOUS | Status: DC | PRN
  Administered 2021-10-27: 1000 mg via INTRAVENOUS

## 2021-10-27 MED ORDER — SUCCINYLCHOLINE CHLORIDE 200 MG/10ML IV SOSY
PREFILLED_SYRINGE | INTRAVENOUS | Status: DC | PRN
  Administered 2021-10-27: 60 mg via INTRAVENOUS

## 2021-10-27 MED ORDER — ASPIRIN 81 MG PO CHEW
CHEWABLE_TABLET | ORAL | Status: AC
  Filled 2021-10-27: qty 1

## 2021-10-27 MED ORDER — SODIUM CHLORIDE 0.9 % IV SOLN: INTRAVENOUS | Status: DC

## 2021-10-27 MED ORDER — PROPOFOL 10 MG/ML IV BOLUS
INTRAVENOUS | Status: DC | PRN
  Administered 2021-10-27: 70 mg via INTRAVENOUS

## 2021-10-27 MED ORDER — STROKE: EARLY STAGES OF RECOVERY BOOK: Freq: Once | Status: DC

## 2021-10-27 MED ORDER — CHLORHEXIDINE GLUCONATE CLOTH 2 % EX PADS
6.0000 | MEDICATED_PAD | Freq: Every day | CUTANEOUS | Status: DC
  Administered 2021-10-27 – 2021-10-28 (×2): 6 via TOPICAL

## 2021-10-27 MED ORDER — ASPIRIN 300 MG RE SUPP
300.0000 mg | Freq: Every day | RECTAL | Status: DC
  Administered 2021-10-27 – 2021-10-28 (×2): 300 mg via RECTAL
  Filled 2021-10-27 (×2): qty 1

## 2021-10-27 MED ORDER — NITROGLYCERIN 1 MG/10 ML FOR IR/CATH LAB
INTRA_ARTERIAL | Status: DC | PRN
  Administered 2021-10-27 (×4): 25 ug via INTRA_ARTERIAL

## 2021-10-27 MED ORDER — ACETAMINOPHEN 325 MG PO TABS: 650.0000 mg | ORAL_TABLET | ORAL | Status: DC | PRN

## 2021-10-27 MED ORDER — ISOSORBIDE MONONITRATE ER 30 MG PO TB24: 30.0000 mg | ORAL_TABLET | Freq: Every day | ORAL | Status: DC

## 2021-10-27 MED ORDER — FENTANYL CITRATE (PF) 100 MCG/2ML IJ SOLN
INTRAMUSCULAR | Status: DC | PRN
  Administered 2021-10-27 (×2): 25 ug via INTRAVENOUS

## 2021-10-27 MED ORDER — ESMOLOL HCL 100 MG/10ML IV SOLN
INTRAVENOUS | Status: DC | PRN
  Administered 2021-10-27: 30 mg via INTRAVENOUS
  Administered 2021-10-27: 20 mg via INTRAVENOUS

## 2021-10-27 MED ORDER — FENTANYL CITRATE (PF) 100 MCG/2ML IJ SOLN
INTRAMUSCULAR | Status: AC
  Filled 2021-10-27: qty 2

## 2021-10-27 MED ORDER — VANCOMYCIN HCL IN DEXTROSE 1-5 GM/200ML-% IV SOLN
INTRAVENOUS | Status: AC
  Filled 2021-10-27: qty 200

## 2021-10-27 MED ORDER — LIDOCAINE HCL (CARDIAC) PF 100 MG/5ML IV SOSY
PREFILLED_SYRINGE | INTRAVENOUS | Status: DC | PRN
  Administered 2021-10-27: 50 mg via INTRATRACHEAL

## 2021-10-27 MED ORDER — ACETAMINOPHEN 650 MG RE SUPP
650.0000 mg | RECTAL | Status: DC | PRN
  Administered 2021-10-28: 650 mg via RECTAL
  Filled 2021-10-27: qty 1

## 2021-10-27 MED ORDER — CLEVIDIPINE BUTYRATE 0.5 MG/ML IV EMUL
0.0000 mg/h | INTRAVENOUS | Status: AC
  Administered 2021-10-27: 2 mg/h via INTRAVENOUS
  Administered 2021-10-28: 5 mg/h via INTRAVENOUS
  Filled 2021-10-27 (×3): qty 50

## 2021-10-27 MED ORDER — IOHEXOL 350 MG/ML SOLN
100.0000 mL | Freq: Once | INTRAVENOUS | Status: AC | PRN
  Administered 2021-10-27: 100 mL via INTRAVENOUS

## 2021-10-27 MED ORDER — IOHEXOL 350 MG/ML SOLN
100.0000 mL | Freq: Once | INTRAVENOUS | Status: AC | PRN
  Administered 2021-10-27: 100 mL via INTRA_ARTERIAL

## 2021-10-27 MED ORDER — EPTIFIBATIDE 20 MG/10ML IV SOLN
INTRAVENOUS | Status: AC
  Filled 2021-10-27: qty 10

## 2021-10-27 MED ORDER — POTASSIUM CHLORIDE 10 MEQ/100ML IV SOLN
10.0000 meq | INTRAVENOUS | Status: AC
  Administered 2021-10-27 (×3): 10 meq via INTRAVENOUS
  Filled 2021-10-27 (×3): qty 100

## 2021-10-27 MED ORDER — VERAPAMIL HCL 2.5 MG/ML IV SOLN
INTRAVENOUS | Status: AC
  Filled 2021-10-27: qty 2

## 2021-10-27 MED ORDER — LABETALOL HCL 5 MG/ML IV SOLN
INTRAVENOUS | Status: DC | PRN
  Administered 2021-10-27 (×3): 5 mg via INTRAVENOUS

## 2021-10-27 MED ORDER — NITROGLYCERIN 1 MG/10 ML FOR IR/CATH LAB
INTRA_ARTERIAL | Status: AC
  Filled 2021-10-27: qty 10

## 2021-10-27 MED ORDER — TICAGRELOR 90 MG PO TABS
ORAL_TABLET | ORAL | Status: AC
  Filled 2021-10-27: qty 2

## 2021-10-27 MED ORDER — TIROFIBAN HCL IN NACL 5-0.9 MG/100ML-% IV SOLN
INTRAVENOUS | Status: AC
  Filled 2021-10-27: qty 100

## 2021-10-27 MED ORDER — ROCURONIUM BROMIDE 100 MG/10ML IV SOLN
INTRAVENOUS | Status: DC | PRN
  Administered 2021-10-27 (×2): 10 mg via INTRAVENOUS
  Administered 2021-10-27: 50 mg via INTRAVENOUS

## 2021-10-27 MED ORDER — ACETAMINOPHEN 160 MG/5ML PO SOLN: 650.0000 mg | ORAL | Status: DC | PRN

## 2021-10-27 MED ORDER — CLEVIDIPINE BUTYRATE 0.5 MG/ML IV EMUL
INTRAVENOUS | Status: AC
  Filled 2021-10-27: qty 50

## 2021-10-27 MED ORDER — PHENYLEPHRINE 40 MCG/ML (10ML) SYRINGE FOR IV PUSH (FOR BLOOD PRESSURE SUPPORT)
PREFILLED_SYRINGE | INTRAVENOUS | Status: DC | PRN
  Administered 2021-10-27: 80 ug via INTRAVENOUS
  Administered 2021-10-27 (×3): 40 ug via INTRAVENOUS

## 2021-10-27 MED ORDER — CLOPIDOGREL BISULFATE 300 MG PO TABS
ORAL_TABLET | ORAL | Status: AC
  Filled 2021-10-27: qty 1

## 2021-10-27 MED ORDER — ACETAMINOPHEN 650 MG RE SUPP: 650.0000 mg | RECTAL | Status: DC | PRN

## 2021-10-27 MED ORDER — SUGAMMADEX SODIUM 200 MG/2ML IV SOLN
INTRAVENOUS | Status: DC | PRN
  Administered 2021-10-27: 200 mg via INTRAVENOUS

## 2021-10-27 MED ORDER — GLYCOPYRROLATE 0.2 MG/ML IJ SOLN
INTRAMUSCULAR | Status: DC | PRN
Start: 2021-10-27 — End: 2021-10-27
  Administered 2021-10-27 (×2): .1 mg via INTRAVENOUS

## 2021-10-27 MED ORDER — ONDANSETRON HCL 4 MG/2ML IJ SOLN
INTRAMUSCULAR | Status: DC | PRN
  Administered 2021-10-27 (×2): 4 mg via INTRAVENOUS

## 2021-10-27 MED ORDER — SODIUM CHLORIDE 0.9% FLUSH
3.0000 mL | Freq: Once | INTRAVENOUS | Status: AC
  Administered 2021-10-27: 3 mL via INTRAVENOUS

## 2021-10-27 MED ORDER — CANGRELOR TETRASODIUM 50 MG IV SOLR
INTRAVENOUS | Status: AC
  Filled 2021-10-27: qty 50

## 2021-10-27 MED ORDER — DEXAMETHASONE SODIUM PHOSPHATE 10 MG/ML IJ SOLN
INTRAMUSCULAR | Status: DC | PRN
Start: 2021-10-27 — End: 2021-10-27
  Administered 2021-10-27: 5 mg via INTRAVENOUS

## 2021-10-27 MED ORDER — BRINZOLAMIDE 1 % OP SUSP
1.0000 [drp] | Freq: Every day | OPHTHALMIC | Status: DC
  Administered 2021-10-28: 1 [drp] via OPHTHALMIC
  Filled 2021-10-27: qty 10

## 2021-10-27 MED ORDER — MIRTAZAPINE 7.5 MG PO TABS
7.5000 mg | ORAL_TABLET | Freq: Every day | ORAL | Status: DC
  Filled 2021-10-27 (×2): qty 1

## 2021-10-27 MED ORDER — SODIUM CHLORIDE 0.9 % IV SOLN: INTRAVENOUS | Status: DC | PRN

## 2021-10-27 MED ORDER — IOHEXOL 350 MG/ML SOLN
100.0000 mL | Freq: Once | INTRAVENOUS | Status: AC | PRN
  Administered 2021-10-27: 84 mL via INTRA_ARTERIAL

## 2021-10-27 MED ORDER — ASPIRIN 325 MG PO TABS: 325.0000 mg | ORAL_TABLET | Freq: Every day | ORAL | Status: DC

## 2021-10-27 MED ORDER — PHENYLEPHRINE HCL-NACL 20-0.9 MG/250ML-% IV SOLN
INTRAVENOUS | Status: DC | PRN
  Administered 2021-10-27: 50 ug/min via INTRAVENOUS

## 2021-10-27 NOTE — Progress Notes (Signed)
  Echocardiogram 2D Echocardiogram has been performed.  Roosvelt Maser F 11/03/2021, 4:17 PM

## 2021-10-27 NOTE — ED Triage Notes (Addendum)
Per [facility], family says last seen normal was 9pm.  They found her after a fall 3:30 w/ right sided weakness, gaze to the left.  She was found in the hall w/ head against the door frame. Pt can not talk to staff and does not follow directions.  Pt on blood thinners     18G left AC  HR 80 170/98 96% RA 10RR CBG 126

## 2021-10-27 NOTE — Progress Notes (Signed)
PT Cancellation Note  Patient Details Name: Latoya Merritt MRN: [id number] DOB: 03-Jun-1920   Cancelled Treatment:    Reason Eval/Treat Not Completed: Medical issues which prohibited therapy.  Pt has just been intubated, hold PT for further medical review.   Ivar Drape 11/17/2021, 8:33 AM  Samul Dada, PT PhD Acute Rehab Dept. Number: [facility] [id number] and [facility] [phone]

## 2021-10-27 NOTE — Anesthesia Postprocedure Evaluation (Signed)
Anesthesia Post Note  Patient: Latoya Merritt  Procedure(s) Performed: IR WITH ANESTHESIA     Patient location during evaluation: ICU Anesthesia Type: General Level of consciousness: awake and alert Pain management: pain level controlled Vital Signs Assessment: post-procedure vital signs reviewed and stable Respiratory status: spontaneous breathing, nonlabored ventilation and respiratory function stable Cardiovascular status: blood pressure returned to baseline and stable Postop Assessment: no apparent nausea or vomiting Anesthetic complications: no   No notable events documented.  Last Vitals:  Vitals:   11/16/2021 0500 10/21/2021 0516  BP: 127/79 (!) 171/78  Pulse: 90 89  Resp: 16 (!) 22  SpO2: 96% 97%    Last Pain: There were no vitals filed for this visit.               Cecile Hearing

## 2021-10-27 NOTE — ED Notes (Signed)
Family arrived, speaking to Neuro provider about options.

## 2021-10-27 NOTE — Progress Notes (Signed)
Cleviprex gtt started.

## 2021-10-27 NOTE — Sedation Documentation (Signed)
8 fr angioseal closure device failed to deploy. Holding manual pressure at this time x 20 min minimum. Quick clot in place. Remove in 24 hours from this time

## 2021-10-27 NOTE — Procedures (Addendum)
S/P Lt common carotid artrriogram . RT CFA approach  S/P partial revascularization of occluded Lt MCA distal M2 branch with x1 pass with 40 mm solitaire X retriever and contact aspiration,x1 pass with contact aspiration,x1 pass with [employer] retriever an contact aspiration and x1 pass with zoom 35 and contact aspiration achieving a TICI 2C revascularization  Post CT brain  Mild post Lt perisylvian hyper density and Lt putamen contrast stain. Early signs of lt MCA hypodensity. No mass effect.  Manual pressure  quick clot for hemostasis at RT CFA site . Distal pulses dopplerableDPs and PTs bilaterally. Extubated. Maintaining Os Sats.  Moves Lt UE and LLE spontaneously. Minimal on the Rt side.  Lt gaze deviation. Non verbal.  S.Princessa Lesmeister MD

## 2021-10-27 NOTE — Progress Notes (Signed)
Patient is in computer as a full code.  When receiving report I was told patient was a DNR.  It appears DNR order was rescinded when patient went to IR.  Spoke with Dr Derry Lory about this who said he would contact the family and see about switching her back to DNR status.

## 2021-10-27 NOTE — Anesthesia Procedure Notes (Signed)
Procedure Name: Intubation Date/Time: 11/13/2021 5:36 AM Performed by: Cintya Daughety T, CRNA Pre-anesthesia Checklist: Patient identified, Emergency Drugs available, Suction available and Patient being monitored Patient Re-evaluated:Patient Re-evaluated prior to induction Oxygen Delivery Method: Circle system utilized Preoxygenation: Pre-oxygenation with 100% oxygen Induction Type: IV induction and Rapid sequence Ventilation: Mask ventilation without difficulty Laryngoscope Size: Glidescope and 3 Tube type: Oral Tube size: 7.5 mm Number of attempts: 1 Airway Equipment and Method: Stylet and Video-laryngoscopy Placement Confirmation: ETT inserted through vocal cords under direct vision, positive ETCO2 and breath sounds checked- equal and bilateral Secured at: 22 cm Tube secured with: Tape Dental Injury: Teeth and Oropharynx as per pre-operative assessment  Difficulty Due To: Difficulty was anticipated, Difficult Airway- due to anterior larynx and Difficult Airway- due to limited oral opening Future Recommendations: Recommend- induction with short-acting agent, and alternative techniques readily available

## 2021-10-27 NOTE — ED Provider Notes (Signed)
[facility] EMERGENCY DEPARTMENT Provider Note   CSN: [id number] Arrival date & time: 11/18/2021  0418     History No chief complaint on file.   Latoya Merritt is a 87 y.o. female.  Level 5 caveat due to acuity of condition.  Patient brought in by EMS as code stroke.  She has right-sided weakness, left-sided gaze, neglect on the right side.  Last normal was 9 PM when she went to bed.  Family reported a fall throughout the night after the weakness began. Patient unable to give any history.  Does have a history of DVT on Eliquis.  Also hypertension hyperlipidemia and atrial fibrillation.  The history is provided by the patient and the EMS personnel. The history is limited by the condition of the patient.      Past Medical History:  Diagnosis Date   Atrial fibrillation [facility])    Colon polyp 2008   Coronary artery disease    DVT (deep venous thrombosis) (HCC) May 2007   History of DVT of lower extremity May 2007   Hyperlipidemia    Hypertension    Polyp, stomach    Sensorineural hearing loss 2006   sudden, left ear   Stroke [facility])     Patient Active Problem List   Diagnosis Date Noted   Acute cough 10/24/2021   Chest congestion 10/24/2021   Upper respiratory tract infection 10/24/2021   Weight loss, abnormal 07/18/2021   Pulmonary emphysema (HCC) 06/04/2021   Pleural effusion, bilateral 06/04/2021   Cardiomegaly 06/04/2021   Acquired thrombophilia (HCC) 06/09/2020   Dyspnea on exertion 06/09/2020   Myalgia due to statin 06/09/2020   Posterior dislocation of IOL (intraocular lens) 04/08/2018   Routine adult health maintenance 04/10/2017   Advance directive discussed with patient 04/10/2017   B12 deficiency anemia 08/01/2016   Long term current use of anticoagulant therapy 10/11/2014   Urinary frequency 08/09/2014   History of non-ST elevation myocardial infarction (NSTEMI) 06/11/2013   Bradycardia, sinus 12/22/2012   Coronary artery disease     Vascular dementia without behavioral disturbance (HCC) 12/31/2011   History of CVA (cerebrovascular accident) 12/31/2011   Hyperlipidemia    Hypertension    Colon polyp    History of DVT of lower extremity    Atrial fibrillation (HCC) 08/27/2010   CHEST PAIN 08/27/2010   ABNORMAL CV (STRESS) TEST 08/27/2010   History of DVT (deep vein thrombosis) 04/20/2006    Past Surgical History:  Procedure Laterality Date   ABDOMINAL HYSTERECTOMY  1965   APPENDECTOMY  1950   BREAST SURGERY  1972   mastectomies, presumed due to breast CA   LAMINECTOMY  1975   LASER SURGERY FOR VARICOSE VEINS     NASAL POLYP SURGERY  2008   Dr. Jenne Campus, has been horase ever since   THYROID SURGERY  1977   nodule removed   TONSILLECTOMY  1943     OB History   No obstetric history on file.     Family History  Problem Relation Age of Onset   Heart failure Mother    Heart failure Brother    Cancer Other     Social History   Tobacco Use   Smoking status: Never   Smokeless tobacco: Never  Vaping Use   Vaping Use: Never used  Substance Use Topics   Alcohol use: Not Currently    Alcohol/week: 7.0 standard drinks    Types: 7 Glasses of wine per week    Comment: glass a wine at  bedtime   Drug use: No    Home Medications Prior to Admission medications   Medication Sig Start Date End Date Taking? Authorizing Provider  acetaminophen (TYLENOL) 500 MG tablet Take 500 mg by mouth every 6 (six) hours as needed for pain.    [provider]  brinzolamide (AZOPT) 1 % ophthalmic suspension Place 1 drop into the left eye daily.    [provider]  carboxymethylcellulose (REFRESH PLUS) 0.5 % SOLN 1 drop 3 (three) times daily as needed.    [provider]  cyanocobalamin 1000 MCG tablet Take 1,000 mcg by mouth daily.    [provider]  ELIQUIS 2.5 MG TABS tablet TAKE 1 TABLET BY MOUTH  TWICE DAILY 11/29/20   Iran Ouch, MD  furosemide (LASIX) 20 MG tablet Take 1  tablet (20 mg total) by mouth daily. 09/08/21 09/08/22  Marisue Ivan D, PA-C  guaiFENesin (MUCINEX) 600 MG 12 hr tablet Take 1 tablet (600 mg total) by mouth 2 (two) times daily for 7 days. 10/24/21 10/31/21  Eden Emms, NP  ipratropium (ATROVENT) 0.06 % nasal spray Place 2 sprays into the nose daily.    [provider]  isosorbide mononitrate (IMDUR) 30 MG 24 hr tablet Take 1 tablet (30 mg total) by mouth daily. 07/03/21   Iran Ouch, MD  mirtazapine (REMERON) 7.5 MG tablet Take 1 tablet (7.5 mg total) by mouth at bedtime. 07/18/21   Sherlene Shams, MD  mupirocin ointment (BACTROBAN) 2 % Apply 1 application topically 2 (two) times daily. 05/29/21   McLean-Scocuzza, Pasty Spillers, MD  potassium chloride (KLOR-CON) 10 MEQ tablet Take 1 tablet (10 mEq total) by mouth daily when taking your lasix (fluid pill). 09/08/21   Marisue Ivan D, PA-C    Allergies    Amoxicillin-pot clavulanate, Cephalexin, Clarithromycin, Clarithromycin, Clindamycin/lincomycin, Lansoprazole, Macrodantin, Sulfa antibiotics, Tussionex pennkinetic er [hydrocod polst-cpm polst er], Amoxicillin, Cephalexin, Clindamycin, Hydrocodone-chlorpheniramine, Lansoprazole, Lisinopril, Nitrofurantoin, and Other  Review of Systems   Review of Systems  Unable to perform ROS: Acuity of condition   Physical Exam Updated Vital Signs BP 127/79   Pulse 90   Resp 16   SpO2 96%   Physical Exam Vitals and nursing note reviewed.  Constitutional:      General: She is not in acute distress.    Appearance: She is well-developed. She is ill-appearing.     Comments: Obtunded, left-sided gaze, does not speak or follow command Aphasic Moans some.  HENT:     Head: Normocephalic and atraumatic.     Mouth/Throat:     Pharynx: No oropharyngeal exudate.  Eyes:     Conjunctiva/sclera: Conjunctivae normal.     Pupils: Pupils are equal, round, and reactive to light.  Neck:     Comments: No meningismus. Cardiovascular:     Rate  and Rhythm: Normal rate. Rhythm irregular.     Heart sounds: Normal heart sounds. No murmur heard. Pulmonary:     Effort: Pulmonary effort is normal. No respiratory distress.     Breath sounds: Normal breath sounds.  Abdominal:     Palpations: Abdomen is soft.     Tenderness: There is no abdominal tenderness. There is no guarding or rebound.  Musculoskeletal:        General: No tenderness. Normal range of motion.     Cervical back: Normal range of motion and neck supple.  Skin:    General: Skin is warm.     Comments: Wound dressing L shin  Neurological:  Mental Status: She is alert.     Cranial Nerves: Cranial nerve deficit present.     Sensory: Sensory deficit present.     Motor: Weakness present. No abnormal muscle tone.     Coordination: Coordination abnormal.     Comments: Left-sided gaze, right facial droop, able to hold left leg and arm off the bed. Right arm contracted.  Weakness in right arm and right leg with difficulty holding off the bed. Right-sided neglect  Psychiatric:        Behavior: Behavior normal.    ED Results / Procedures / Treatments   Labs (all labs ordered are listed, but only abnormal results are displayed) Labs Reviewed  CBC - Abnormal; Notable for the following components:      Result Value   RBC 3.43 (*)    Hemoglobin 11.0 (*)    HCT 32.7 (*)    All other components within normal limits  COMPREHENSIVE METABOLIC PANEL - Abnormal; Notable for the following components:   Potassium 3.2 (*)    Glucose, Bld 138 (*)    BUN 24 (*)    Creatinine, Ser 1.41 (*)    Total Protein 6.4 (*)    Albumin 3.1 (*)    Total Bilirubin 1.4 (*)    GFR, Estimated 34 (*)    All other components within normal limits  CBG MONITORING, ED - Abnormal; Notable for the following components:   Glucose-Capillary 115 (*)    All other components within normal limits  I-STAT CHEM 8, ED - Abnormal; Notable for the following components:   Potassium 3.1 (*)    Chloride 97 (*)     BUN 25 (*)    Creatinine, Ser 1.30 (*)    Glucose, Bld 129 (*)    Hemoglobin 11.2 (*)    HCT 33.0 (*)    All other components within normal limits  RESP PANEL BY RT-PCR (FLU A&B, COVID) ARPGX2  PROTIME-INR  APTT  DIFFERENTIAL  ETHANOL  RAPID URINE DRUG SCREEN, HOSP PERFORMED  URINALYSIS, ROUTINE W REFLEX MICROSCOPIC  CBG MONITORING, ED  I-STAT CHEM 8, ED    EKG None  Radiology CT CEREBRAL PERFUSION W CONTRAST  Result Date: 11/22/21 CLINICAL DATA:  87 year old female.  Code stroke. EXAM: CT ANGIOGRAPHY HEAD AND NECK CT PERFUSION BRAIN TECHNIQUE: Multidetector CT imaging of the head and neck was performed using the standard protocol during bolus administration of intravenous contrast. Multiplanar CT image reconstructions and MIPs were obtained to evaluate the vascular anatomy. Carotid stenosis measurements (when applicable) are obtained utilizing NASCET criteria, using the distal internal carotid diameter as the denominator. Multiphase CT imaging of the brain was performed following IV bolus contrast injection. Subsequent parametric perfusion maps were calculated using RAPID software. CONTRAST:  OMNIPAQUE IOHEXOL 350 MG/ML SOLN COMPARISON:  Plain head CT 0431 hours. FINDINGS: CT Brain Perfusion Findings: ASPECTS: 10 CBF (<30%) Volume: 0mL Perfusion (Tmax>6.0s) volume: 74mL.  Hypoperfusion index 0.5. Mismatch Volume: 74mL Infarction Location:Posterior left MCA territory. CTA NECK Skeleton: No acute osseous abnormality identified. No skull fracture identified despite scalp hematoma. Cervical spine degeneration. Upper chest: Small layering left pleural effusion. Other neck: Small volume retained secretions in the right nasopharynx. No acute neck soft tissue finding. Aortic arch: 3 vessel arch configuration. Tortuous arch and proximal great vessels with mild calcified plaque. Right carotid system: Tortuous right CCA. Minor plaque at the right carotid bifurcation mostly affecting the ECA.  Tortuosity of the right ICA without stenosis. Left carotid system: Similar tortuosity with minor plaque  and no stenosis. Vertebral arteries: Similar tortuosity and mild plaque.  No stenosis to the skull base. CTA HEAD Posterior circulation: Patent distal vertebral arteries with mild tortuosity, no plaque or stenosis. Patent PICA origins. Fenestrated vertebrobasilar junction (normal variant) without stenosis. Patent basilar artery without stenosis. Patent SCA and PCA origins. Posterior communicating arteries are diminutive or absent. Bilateral PCA branches are within normal limits. Anterior circulation: Both ICA siphons are patent with calcified plaque. On the right there is only mild siphon stenosis. On the left there is mild to moderate supraclinoid stenosis best demonstrated on series 9, image 92. Patent carotid termini. Patent MCA and ACA origins. Anterior communicating artery, bilateral ACA branches, right MCA M1 segment, and right MCA branches appear within normal limits. Left MCA M1 segment is patent past the anterior temporal artery and to the MCA bifurcation but there is a proximal right M2 branch occlusion on series 9, image 125 which appears to be the dominant posterior branch. See also series 12, image 25 and series 10, image 18. Venous sinuses: Patent. Anatomic variants: None significant. Review of the MIP images confirms the above findings IMPRESSION: 1. Positive for Emergent Large Vessel Occlusion: Left MCA M2 posterior division near its origin. 2. Otherwise generalized vessel tortuosity but mild for age atherosclerosis in the head and neck. Mild to moderate left ICA siphon supraclinoid stenosis due to calcified plaque. But no other hemodynamically significant stenosis. Preliminary report of the above communicated to Dr. Amada Jupiter at 4:56 am on November 01, 2021 by text page via the [facility] messaging system. Electronically Signed   By: Odessa Fleming M.D.   On: 11/01/2021 05:01   CT HEAD CODE STROKE WO  CONTRAST  Result Date: 11-01-2021 CLINICAL DATA:  Code stroke.  87 year old female. EXAM: CT HEAD WITHOUT CONTRAST TECHNIQUE: Contiguous axial images were obtained from the base of the skull through the vertex without intravenous contrast. COMPARISON:  Brain MRI 10/22/2012.  Head CT 10/20/2021. FINDINGS: Brain: Mild motion artifact. Stable cerebral volume. No midline shift, mass effect, or evidence of intracranial mass lesion. No acute intracranial hemorrhage identified. No ventriculomegaly. Some chronic cortical encephalomalacia in the right hemisphere. Confluent bilateral white matter hypodensity. Gray-white matter differentiation appears stable since last month. No cortically based acute infarct identified. Vascular: Calcified atherosclerosis at the skull base. No suspicious intracranial vascular hyperdensity. Skull: No acute osseous abnormality identified. Sinuses/Orbits: Chronic paranasal sinusitis with mucoperiosteal thickening. Progressive ethmoid mucosal thickening since last month. Tympanic cavities and mastoids remain well aerated. Other: Leftward gaze. Broad-based right posterior scalp hematoma on series 3, image 34 measuring up to 7 mm in thickness. And there is a 2nd slightly larger left posterosuperior convexity scalp hematoma on image 58. Underlying calvarium appears intact. ASPECTS [facility] Stroke Program Early CT Score) Total score (0-10 with 10 being normal): 10 (chronic cortical encephalomalacia in the right hemisphere). IMPRESSION: 1. Mild motion artifact, with no acute cortically based infarct or intracranial hemorrhage identified. ASPECTS 10. 2. Bilateral scalp hematomas.  No skull fracture identified. 3. Chronic right MCA infarct. Chronic bilateral white matter disease. 4. These results were communicated to Dr. Amada Jupiter at 4:38 am on 11-01-21 by text page via the [facility] messaging system. Electronically Signed   By: Odessa Fleming M.D.   On: 11-01-2021 04:38   CT ANGIO HEAD NECK W WO CM (CODE  STROKE)  Result Date: 11/01/2021 CLINICAL DATA:  87 year old female.  Code stroke. EXAM: CT ANGIOGRAPHY HEAD AND NECK CT PERFUSION BRAIN TECHNIQUE: Multidetector CT imaging of the head and neck was performed using  the standard protocol during bolus administration of intravenous contrast. Multiplanar CT image reconstructions and MIPs were obtained to evaluate the vascular anatomy. Carotid stenosis measurements (when applicable) are obtained utilizing NASCET criteria, using the distal internal carotid diameter as the denominator. Multiphase CT imaging of the brain was performed following IV bolus contrast injection. Subsequent parametric perfusion maps were calculated using RAPID software. CONTRAST:  OMNIPAQUE IOHEXOL 350 MG/ML SOLN COMPARISON:  Plain head CT 0431 hours. FINDINGS: CT Brain Perfusion Findings: ASPECTS: 10 CBF (<30%) Volume: 31mL Perfusion (Tmax>6.0s) volume: 41mL.  Hypoperfusion index 0.5. Mismatch Volume: 109mL Infarction Location:Posterior left MCA territory. CTA NECK Skeleton: No acute osseous abnormality identified. No skull fracture identified despite scalp hematoma. Cervical spine degeneration. Upper chest: Small layering left pleural effusion. Other neck: Small volume retained secretions in the right nasopharynx. No acute neck soft tissue finding. Aortic arch: 3 vessel arch configuration. Tortuous arch and proximal great vessels with mild calcified plaque. Right carotid system: Tortuous right CCA. Minor plaque at the right carotid bifurcation mostly affecting the ECA. Tortuosity of the right ICA without stenosis. Left carotid system: Similar tortuosity with minor plaque and no stenosis. Vertebral arteries: Similar tortuosity and mild plaque.  No stenosis to the skull base. CTA HEAD Posterior circulation: Patent distal vertebral arteries with mild tortuosity, no plaque or stenosis. Patent PICA origins. Fenestrated vertebrobasilar junction (normal variant) without stenosis. Patent basilar  artery without stenosis. Patent SCA and PCA origins. Posterior communicating arteries are diminutive or absent. Bilateral PCA branches are within normal limits. Anterior circulation: Both ICA siphons are patent with calcified plaque. On the right there is only mild siphon stenosis. On the left there is mild to moderate supraclinoid stenosis best demonstrated on series 9, image 92. Patent carotid termini. Patent MCA and ACA origins. Anterior communicating artery, bilateral ACA branches, right MCA M1 segment, and right MCA branches appear within normal limits. Left MCA M1 segment is patent past the anterior temporal artery and to the MCA bifurcation but there is a proximal right M2 branch occlusion on series 9, image 125 which appears to be the dominant posterior branch. See also series 12, image 25 and series 10, image 18. Venous sinuses: Patent. Anatomic variants: None significant. Review of the MIP images confirms the above findings IMPRESSION: 1. Positive for Emergent Large Vessel Occlusion: Left MCA M2 posterior division near its origin. 2. Otherwise generalized vessel tortuosity but mild for age atherosclerosis in the head and neck. Mild to moderate left ICA siphon supraclinoid stenosis due to calcified plaque. But no other hemodynamically significant stenosis. Preliminary report of the above communicated to Dr. Amada Jupiter at 4:56 am on 11-13-21 by text page via the [facility] messaging system. Electronically Signed   By: Odessa Fleming M.D.   On: Nov 13, 2021 05:01    Procedures .Critical Care Performed by: Glynn Octave, MD Authorized by: Glynn Octave, MD   Critical care provider statement:    Critical care time (minutes):  45   Critical care time was exclusive of:  Separately billable procedures and treating other patients   Critical care was necessary to treat or prevent imminent or life-threatening deterioration of the following conditions:  CNS failure or compromise   Critical care was time spent  personally by me on the following activities:  Blood draw for specimens, development of treatment plan with patient or surrogate, discussions with consultants, evaluation of patient's response to treatment, examination of patient, obtaining history from patient or surrogate, ordering and performing treatments and interventions, ordering and review of laboratory  studies, ordering and review of radiographic studies, pulse oximetry, re-evaluation of patient's condition and review of old charts   I assumed direction of critical care for this patient from another provider in my specialty: no     Care discussed with: admitting provider     Medications Ordered in ED Medications  sodium chloride flush (NS) 0.9 % injection 3 mL (has no administration in time range)    ED Course  I have reviewed the triage vital signs and the nursing notes.  Pertinent labs & imaging results that were available during my care of the patient were reviewed by me and considered in my medical decision making (see chart for details).    MDM Rules/Calculators/A&P                          Code stroke seen on arrival with Dr. Amada Jupiter of neurology.  Patient also with head trauma on Eliquis.  She is not a tPA candidate due to blood thinner use as well as delayed presentation  CT head is negative for hemorrhage. CTA positive for left M2 occlusion.  Risks and benefits of IR thrombectomy discussed with patient's family by Dr. Amada Jupiter of neurology. Family agreeable to proceed for intervention  After extensive family discussion, decision was made to proceed to IR for attempted thrombectomy.  Despite her advanced age patient is reportedly very functional and does her own yard work.  She only needs some help with meal preparation.  Dr. Amada Jupiter discussed risks and benefits with family and agreed to proceed emergently for intervention. Final Clinical Impression(s) / ED Diagnoses Final diagnoses:  None    Rx / DC  Orders ED Discharge Orders     None        Shalawn Wynder, Jeannett Senior, MD 11/04/2021 0730

## 2021-10-27 NOTE — Progress Notes (Signed)
Cleviprex gtt restarted

## 2021-10-27 NOTE — Anesthesia Procedure Notes (Signed)
Arterial Line Insertion Start/End11/15/2022 5:36 AM,  5:38 AM Performed by: CRNA  Patient location: OOR procedure area. Emergency situation Left, radial was placed Catheter size: 20 G Hand hygiene performed , maximum sterile barriers used  and Seldinger technique used Allen's test indicative of satisfactory collateral circulation Attempts: 1 Procedure performed without using ultrasound guided technique. Following insertion, dressing applied and Biopatch. Post procedure assessment: normal  Patient tolerated the procedure well with no immediate complications.

## 2021-10-27 NOTE — ED Notes (Signed)
Pt, RN and family to IR pod.

## 2021-10-27 NOTE — Transfer of Care (Signed)
Immediate Anesthesia Transfer of Care Note  Patient: RYLA CAUTHON  Procedure(s) Performed: IR WITH ANESTHESIA  Patient Location: ICU  Anesthesia Type:General  Level of Consciousness: drowsy and responds to stimulation  Airway & Oxygen Therapy: Patient Spontanous Breathing and Patient connected to nasal cannula oxygen  Post-op Assessment: Report given to RN and Post -op Vital signs reviewed and stable  Post vital signs: Reviewed and stable  Last Vitals:  Vitals Value Taken Time  BP 116/69 10/26/2021 0937  Temp    Pulse 61 11/08/2021 0943  Resp 17 11/17/2021 0943  SpO2 100 % 11/02/2021 0943  Vitals shown include unvalidated device data.  Last Pain: There were no vitals filed for this visit.       Complications: No notable events documented.

## 2021-10-27 NOTE — H&P (Signed)
Neurology H&P  CC: Aphasia  History is obtained from: Son  HPI: Latoya Merritt is a 87 y.o. female with a history of hypertension, hyperlipidemia, atrial fibrillation on Eliquis who presents with right-sided weakness and aphasia.  She was in her normal state of health at 9 PM, and then her son states that she got up to go to the bathroom and then he heard her fall on the way back.  Due to this, he called 911 and she was brought emergently as a code stroke.  She was taken to CT and a CT/CTA/CT were performed.  This demonstrated an M2 occlusion with a large penumbra.  Speaking to them about her baseline, she is fairly functionally independent.  She does need help preparing meals, but otherwise is fairly independent.  She lives alone.  She walks with a cane but still goes outside and does yard work, and has been out raking leaves this fall.  She has only very mild short-term memory problems, nothing that impacts her daily life.   LKW: 9 PM tpa given?: No, outside of window IR Thrombectomy?  Yes Modified Rankin Scale: 3-Moderate disability-requires help but walks WITHOUT assistance NIHSS: 26   ROS:  Unable to obtain due to altered mental status.   Past Medical History:  Diagnosis Date   Atrial fibrillation [facility])    Colon polyp 2008   Coronary artery disease    DVT (deep venous thrombosis) [facility]) May 2007   History of DVT of lower extremity May 2007   Hyperlipidemia    Hypertension    Polyp, stomach    Sensorineural hearing loss 2006   sudden, left ear   Stroke (HCC)      Family History  Problem Relation Age of Onset   Heart failure Mother    Heart failure Brother    Cancer Other      Social History:  reports that she has never smoked. She has never used smokeless tobacco. She reports that she does not currently use alcohol after a past usage of about 7.0 standard drinks per week. She reports that she does not use drugs.   Prior to Admission medications   Medication Sig  Start Date End Date Taking? Authorizing Provider  acetaminophen (TYLENOL) 500 MG tablet Take 500 mg by mouth every 6 (six) hours as needed for pain.    [provider]  brinzolamide (AZOPT) 1 % ophthalmic suspension Place 1 drop into the left eye daily.    [provider]  carboxymethylcellulose (REFRESH PLUS) 0.5 % SOLN 1 drop 3 (three) times daily as needed.    [provider]  cyanocobalamin 1000 MCG tablet Take 1,000 mcg by mouth daily.    [provider]  ELIQUIS 2.5 MG TABS tablet TAKE 1 TABLET BY MOUTH  TWICE DAILY 11/29/20   Iran Ouch, MD  furosemide (LASIX) 20 MG tablet Take 1 tablet (20 mg total) by mouth daily. 09/08/21 09/08/22  Marisue Ivan D, PA-C  guaiFENesin (MUCINEX) 600 MG 12 hr tablet Take 1 tablet (600 mg total) by mouth 2 (two) times daily for 7 days. 10/24/21 10/31/21  Eden Emms, NP  ipratropium (ATROVENT) 0.06 % nasal spray Place 2 sprays into the nose daily.    [provider]  isosorbide mononitrate (IMDUR) 30 MG 24 hr tablet Take 1 tablet (30 mg total) by mouth daily. 07/03/21   Iran Ouch, MD  mirtazapine (REMERON) 7.5 MG tablet Take 1 tablet (7.5 mg total) by mouth at bedtime.  07/18/21   Crecencio Mc, MD  mupirocin ointment (BACTROBAN) 2 % Apply 1 application topically 2 (two) times daily. 05/29/21   McLean-Scocuzza, Nino Glow, MD  potassium chloride (KLOR-CON) 10 MEQ tablet Take 1 tablet (10 mEq total) by mouth daily when taking your lasix (fluid pill). 09/08/21   Marrianne Mood D, PA-C     Exam: Current vital signs: BP 127/79   Pulse 90   Resp 16   SpO2 96%    Physical Exam  Constitutional: Appears well-developed and well-nourished.  Psych: Affect appropriate to situation Eyes: No scleral injection HENT: No OP obstrucion Head: Normocephalic.  Cardiovascular: Normal rate and regular rhythm.  Respiratory: Effort normal and breath sounds normal to anterior ascultation GI: Soft.  No distension.  There is no tenderness.  Skin: She has a healing laceration on her left shin  Neuro: Mental Status: Patient is awake, alert, she has a dense global aphasia, output is garbled nonsensical sounds Cranial Nerves: II: She does not blink to threat from the right. Pupils are equal, round, and reactive to light.   III,IV, VI: She has a left gaze deviation VII: She has a significant right facial droop Motor: She is able to lift both the left arm and leg against gravity, though she does not cooperate with holding them aloft.  On the right side, she has 3/5 weakness of the right upper extremity and 4/5 weakness of the right lower extremity Sensory: She response to neck stimulation less on the right than left Cerebellar: She does not perform  I have reviewed labs in epic and the pertinent results are: Creatinine 1.41 Potassium 3.2 Hemoglobin 11.2  I have reviewed the images obtained: CT/CTA/CTP-M2 occlusion with large penumbra  Primary Diagnosis:  Cerebral infarction due to embolism of  left middle cerebral artery.   Secondary Diagnosis: Essential (primary) hypertension, Heart failure, unspecified, Chronic atrial fibrillation, CKD Stage 3 (GFR 30-59), and Hypokalemia  Impression: 87 year old female with a history of atrial fibrillation on anticoagulation who presents with left M2 occlusion.  She did miss at least one dose of her Eliquis 2 days ago, but unclear if she missed any more than that.  Though she did have amenable imaging, she does have an extremely advanced age and therefore there was some delay in discussion with the family regarding intervention.  My suspicion is that she very well could survive the stroke, but in a severely debilitated state due to global aphasia.  She has indicated she would not want to be kept alive by artificial means, but would be okay with procedures if they could potentially improve quality of life.  I discussed with the son that given her advanced age, there  is significant chance of complications of the procedure including worsening of symptoms, intracranial hemorrhage, cardiac complications during the procedure.  He indicated that given her severe aphasia, if there is anything that could be done to try and salvage her ability to speak then she would want to pursue that option.  I think that given that she was fairly functionally independent prior to this, only needing help with meals and meal prep, and the devastating impact on quality of life that the severe aphasia would have, that it would be reasonable to pursue intervention in this case.  Plan: - HgbA1c, fasting lipid panel - MRI of the brain without contrast - Frequent neuro checks - Echocardiogram - CTA head and neck - Prophylactic therapy-pending outcome of procedure - Risk factor modification - Telemetry monitoring - PT consult,  OT consult, Speech consult - Stroke team to follow    This patient is critically ill and at significant risk of neurological worsening, death and care requires constant monitoring of vital signs, hemodynamics,respiratory and cardiac monitoring, neurological assessment, discussion with family, other specialists and medical decision making of high complexity. I spent 65 minutes of neurocritical care time  in the care of  this patient. This was time spent independent of any time provided by nurse practitioner or PA.  Roland Rack, MD [facility] [phone]  If 7pm- 7am, please page neurology on call as listed in [place].

## 2021-10-27 NOTE — Progress Notes (Signed)
SLP Cancellation Note  Patient Details Name: Latoya Merritt MRN: [id number] DOB: 1920/11/20  Pt intubated. Will follow along for readiness for speech evaluation.  Kynlei Piontek L. Samson Frederic, MA CCC/SLP Acute Rehabilitation Services Office number [phone] Pager [phone]          Carolan Shiver 11/17/21, 12:43 PM

## 2021-10-27 NOTE — Code Documentation (Signed)
Stroke Response Nurse Documentation Code Documentation  Latoya Merritt is a 87 y.o. female arriving to [facility] ED via [facility] on 11/7 with past medical hx of previous stroke, afib, recent fall, HTN,HLD,CAD. On Eliquis (apixaban) daily. Code stroke was activated by EMS.   Patient from home where she was LKW at 2100 and now complaining of right sided weakness, left gaze and aphasia.    Stroke team at the bedside on patient arrival. Labs drawn and patient cleared for CT by Dr. Manus Gunning. Patient to CT with team. NIHSS 26, see documentation for details and code stroke times. Patient with disoriented, not following commands, left gaze preference , right hemianopia, right facial droop, right arm weakness, right leg weakness, right decreased sensation, Global aphasia , dysarthria , and right neglect on exam. The following imaging was completed:  CT, CTA head and neck, and CTP. Patient is not a candidate for IV Thrombolytic due to out of window. Patient is a candidate for IR due to LVO.   Care/Plan: To IR.   Bedside handoff with ED RN Victorino Dike.    Rose Fillers  Rapid Response RN

## 2021-10-27 NOTE — Progress Notes (Signed)
Patient is s/p partial revascularization of occluded Lt MCA distal M2 branch achieving a TICI 2C revascularization.   Patient was extubated post procedure and was transferred to NICU.  Patient was seen in NICU room with Dr. Corliss Skains.  Son at bedside, states that the patient has not spoken anything yet.  She has been moving left arm and leg, but she has not been moving right arm or leg much.   Upon eval, pt laying in bed, NAD.  Awake, global aphasia, does not follow commands.  Right neglect  Motor power 3 on left UE/LE; 2 on R LE, no movement noted on right UE  Bilat DP dopplerable  Positive dressing on right CFA puncture site. Site is unremarkable with no erythema, edema, tenderness, bleeding or drainage.  Dressing otherwise clean, dry, and intact.   Patient is scheduled for MRI to determine the extensiveness of the CVA.  Further plan per Stroke team, NIR to follow.    Lynann Bologna Gethsemane Fischler PA-C 2021/11/08 2:14 PM

## 2021-10-27 NOTE — Progress Notes (Signed)
Spoke to Dr Corliss Skains about pts pulses. Lt foot PT dopplerable, rt foothas DP dopplerable. Both cool to touch. Ordered to warm feetup, cont assessments.

## 2021-10-27 NOTE — Progress Notes (Addendum)
STROKE TEAM PROGRESS NOTE   INTERVAL HISTORY Her son is at the bedside.  Patient just received to unit s/p IR thrombectomy. Patient is globally aphasic. Moves left arm spontaneously. Right side neglect. Left foot is colder than left with no palpable pulse. RN at bedside and informed to notify IR about left foot with no pulse via palpation but with Doppler posterior tibial pulses felt but not dorsalis pedis.   Vitals:   11/08/2021 1130 11/13/2021 1200 11/13/2021 1245 11/04/2021 1300  BP: 108/62  120/70 133/78  Pulse: (!) 154  76 77  Resp: 18  19 15   Temp:  (!) 96.3 F (35.7 C)    TempSrc:  Axillary    SpO2: 100%  97% 100%   CBC:  Recent Labs  Lab 11/15/2021 0425 11/13/2021 0451  WBC 7.4  --   NEUTROABS 4.4  --   HGB 11.0* 11.2*  HCT 32.7* 33.0*  MCV 95.3  --   PLT 175  --    Basic Metabolic Panel:  Recent Labs  Lab 11/11/2021 0425 11/15/2021 0451  NA 136 137  K 3.2* 3.1*  CL 98 97*  CO2 27  --   GLUCOSE 138* 129*  BUN 24* 25*  CREATININE 1.41* 1.30*  CALCIUM 8.9  --    Lipid Panel: No results for input(s): CHOL, TRIG, HDL, CHOLHDL, VLDL, LDLCALC in the last 168 hours. HgbA1c: No results for input(s): HGBA1C in the last 168 hours. Urine Drug Screen: No results for input(s): LABOPIA, COCAINSCRNUR, LABBENZ, AMPHETMU, THCU, LABBARB in the last 168 hours.  Alcohol Level  Recent Labs  Lab 11/09/2021 1000  ETH <10    IMAGING past 24 hours CT CEREBRAL PERFUSION W CONTRAST  Result Date: 10/28/2021 CLINICAL DATA:  87 year old female.  Code stroke. EXAM: CT ANGIOGRAPHY HEAD AND NECK CT PERFUSION BRAIN TECHNIQUE: Multidetector CT imaging of the head and neck was performed using the standard protocol during bolus administration of intravenous contrast. Multiplanar CT image reconstructions and MIPs were obtained to evaluate the vascular anatomy. Carotid stenosis measurements (when applicable) are obtained utilizing NASCET criteria, using the distal internal carotid diameter as the denominator.  Multiphase CT imaging of the brain was performed following IV bolus contrast injection. Subsequent parametric perfusion maps were calculated using RAPID software. CONTRAST:  114mL OMNIPAQUE IOHEXOL 350 MG/ML SOLN COMPARISON:  Plain head CT 0431 hours. FINDINGS: CT Brain Perfusion Findings: ASPECTS: 10 CBF (<30%) Volume: 35mL Perfusion (Tmax>6.0s) volume: 20mL.  Hypoperfusion index 0.5. Mismatch Volume: 19mL Infarction Location:Posterior left MCA territory. CTA NECK Skeleton: No acute osseous abnormality identified. No skull fracture identified despite scalp hematoma. Cervical spine degeneration. Upper chest: Small layering left pleural effusion. Other neck: Small volume retained secretions in the right nasopharynx. No acute neck soft tissue finding. Aortic arch: 3 vessel arch configuration. Tortuous arch and proximal great vessels with mild calcified plaque. Right carotid system: Tortuous right CCA. Minor plaque at the right carotid bifurcation mostly affecting the ECA. Tortuosity of the right ICA without stenosis. Left carotid system: Similar tortuosity with minor plaque and no stenosis. Vertebral arteries: Similar tortuosity and mild plaque.  No stenosis to the skull base. CTA HEAD Posterior circulation: Patent distal vertebral arteries with mild tortuosity, no plaque or stenosis. Patent PICA origins. Fenestrated vertebrobasilar junction (normal variant) without stenosis. Patent basilar artery without stenosis. Patent SCA and PCA origins. Posterior communicating arteries are diminutive or absent. Bilateral PCA branches are within normal limits. Anterior circulation: Both ICA siphons are patent with calcified plaque. On the right there  is only mild siphon stenosis. On the left there is mild to moderate supraclinoid stenosis best demonstrated on series 9, image 92. Patent carotid termini. Patent MCA and ACA origins. Anterior communicating artery, bilateral ACA branches, right MCA M1 segment, and right MCA branches  appear within normal limits. Left MCA M1 segment is patent past the anterior temporal artery and to the MCA bifurcation but there is a proximal right M2 branch occlusion on series 9, image 125 which appears to be the dominant posterior branch. See also series 12, image 25 and series 10, image 18. Venous sinuses: Patent. Anatomic variants: None significant. Review of the MIP images confirms the above findings IMPRESSION: 1. Positive for Emergent Large Vessel Occlusion: Left MCA M2 posterior division near its origin. 2. Otherwise generalized vessel tortuosity but mild for age atherosclerosis in the head and neck. Mild to moderate left ICA siphon supraclinoid stenosis due to calcified plaque. But no other hemodynamically significant stenosis. Preliminary report of the above communicated to Dr. Amada Jupiter at 4:56 am on 11/12/2021 by text page via the [facility] messaging system. Electronically Signed   By: Odessa Fleming M.D.   On: 11/16/2021 05:01   CT HEAD CODE STROKE WO CONTRAST  Result Date: 11/11/2021 CLINICAL DATA:  Code stroke.  87 year old female. EXAM: CT HEAD WITHOUT CONTRAST TECHNIQUE: Contiguous axial images were obtained from the base of the skull through the vertex without intravenous contrast. COMPARISON:  Brain MRI 10/22/2012.  Head CT 10/20/2021. FINDINGS: Brain: Mild motion artifact. Stable cerebral volume. No midline shift, mass effect, or evidence of intracranial mass lesion. No acute intracranial hemorrhage identified. No ventriculomegaly. Some chronic cortical encephalomalacia in the right hemisphere. Confluent bilateral white matter hypodensity. Gray-white matter differentiation appears stable since last month. No cortically based acute infarct identified. Vascular: Calcified atherosclerosis at the skull base. No suspicious intracranial vascular hyperdensity. Skull: No acute osseous abnormality identified. Sinuses/Orbits: Chronic paranasal sinusitis with mucoperiosteal thickening. Progressive ethmoid  mucosal thickening since last month. Tympanic cavities and mastoids remain well aerated. Other: Leftward gaze. Broad-based right posterior scalp hematoma on series 3, image 34 measuring up to 7 mm in thickness. And there is a 2nd slightly larger left posterosuperior convexity scalp hematoma on image 58. Underlying calvarium appears intact. ASPECTS [facility] Stroke Program Early CT Score) Total score (0-10 with 10 being normal): 10 (chronic cortical encephalomalacia in the right hemisphere). IMPRESSION: 1. Mild motion artifact, with no acute cortically based infarct or intracranial hemorrhage identified. ASPECTS 10. 2. Bilateral scalp hematomas.  No skull fracture identified. 3. Chronic right MCA infarct. Chronic bilateral white matter disease. 4. These results were communicated to Dr. Amada Jupiter at 4:38 am on 11/05/2021 by text page via the [facility] messaging system. Electronically Signed   By: Odessa Fleming M.D.   On: 10/23/2021 04:38   CT ANGIO HEAD NECK W WO CM (CODE STROKE)  Result Date: 10/23/2021 CLINICAL DATA:  87 year old female.  Code stroke. EXAM: CT ANGIOGRAPHY HEAD AND NECK CT PERFUSION BRAIN TECHNIQUE: Multidetector CT imaging of the head and neck was performed using the standard protocol during bolus administration of intravenous contrast. Multiplanar CT image reconstructions and MIPs were obtained to evaluate the vascular anatomy. Carotid stenosis measurements (when applicable) are obtained utilizing NASCET criteria, using the distal internal carotid diameter as the denominator. Multiphase CT imaging of the brain was performed following IV bolus contrast injection. Subsequent parametric perfusion maps were calculated using RAPID software. CONTRAST:  OMNIPAQUE IOHEXOL 350 MG/ML SOLN COMPARISON:  Plain head CT 0431 hours. FINDINGS: CT  Brain Perfusion Findings: ASPECTS: 10 CBF (<30%) Volume: 71mL Perfusion (Tmax>6.0s) volume: 83mL.  Hypoperfusion index 0.5. Mismatch Volume: 70mL Infarction  Location:Posterior left MCA territory. CTA NECK Skeleton: No acute osseous abnormality identified. No skull fracture identified despite scalp hematoma. Cervical spine degeneration. Upper chest: Small layering left pleural effusion. Other neck: Small volume retained secretions in the right nasopharynx. No acute neck soft tissue finding. Aortic arch: 3 vessel arch configuration. Tortuous arch and proximal great vessels with mild calcified plaque. Right carotid system: Tortuous right CCA. Minor plaque at the right carotid bifurcation mostly affecting the ECA. Tortuosity of the right ICA without stenosis. Left carotid system: Similar tortuosity with minor plaque and no stenosis. Vertebral arteries: Similar tortuosity and mild plaque.  No stenosis to the skull base. CTA HEAD Posterior circulation: Patent distal vertebral arteries with mild tortuosity, no plaque or stenosis. Patent PICA origins. Fenestrated vertebrobasilar junction (normal variant) without stenosis. Patent basilar artery without stenosis. Patent SCA and PCA origins. Posterior communicating arteries are diminutive or absent. Bilateral PCA branches are within normal limits. Anterior circulation: Both ICA siphons are patent with calcified plaque. On the right there is only mild siphon stenosis. On the left there is mild to moderate supraclinoid stenosis best demonstrated on series 9, image 92. Patent carotid termini. Patent MCA and ACA origins. Anterior communicating artery, bilateral ACA branches, right MCA M1 segment, and right MCA branches appear within normal limits. Left MCA M1 segment is patent past the anterior temporal artery and to the MCA bifurcation but there is a proximal right M2 branch occlusion on series 9, image 125 which appears to be the dominant posterior branch. See also series 12, image 25 and series 10, image 18. Venous sinuses: Patent. Anatomic variants: None significant. Review of the MIP images confirms the above findings IMPRESSION:  1. Positive for Emergent Large Vessel Occlusion: Left MCA M2 posterior division near its origin. 2. Otherwise generalized vessel tortuosity but mild for age atherosclerosis in the head and neck. Mild to moderate left ICA siphon supraclinoid stenosis due to calcified plaque. But no other hemodynamically significant stenosis. Preliminary report of the above communicated to Dr. Leonel Ramsay at 4:56 am on 10/24/2021 by text page via the [facility] messaging system. Electronically Signed   By: Genevie Ann M.D.   On: 11/03/2021 05:01    PHYSICAL EXAM  Temp:  [96.3 F (35.7 C)] 96.3 F (35.7 C) (11/07 1200) Pulse Rate:  [60-158] 77 (11/07 1300) Resp:  [14-23] 15 (11/07 1300) BP: (108-171)/(62-113) 133/78 (11/07 1300) SpO2:  [92 %-100 %] 100 % (11/07 1300) Arterial Line BP: (120-153)/(48-63) 120/48 (11/07 1130)  General -frail elderly Caucasian lady, in no apparent distress.  Ophthalmologic - fundi not visualized due to noncooperation.  Cardiovascular - A fib on monitor  Mental Status -  Lethargic, globally aphasic, not following commands.  Mute.  Cranial Nerves II - XII - II - no blink to threat on right  III, IV, VI - left gaze deviation.  Will not look to the right VII - right facial droop VIII - Hearing & vestibular intact bilaterally. X unable to assess  XI - unable to assess XII -unable to assess   Motor Strength - dense right hemiplegia. Moves left upper spontaneously withdraws left lower extremity briskly and right left lower extremity slightly to pain.  Right upper extremity is flaccid and no response to pain Motor Tone - Muscle tone was assessed at the neck and appendages and was normal.  Sensory - no response to painful stimuli  on right side   Coordination - unable to assess  Tremor was absent.  Gait and Station - deferred.   ASSESSMENT/PLAN Ms. CHARLAYNE KOPECKY is a 87 y.o. female with history of hypertension, hyperlipidemia, CAD, atrial fibrillation on Eliquis, CKD stage 3, DVT who  presents with right-sided weakness and aphasia.  She was in her normal state of health at 9 PM, and then her son states that she got up to go to the bathroom and then he heard her fall on the way back  Stroke: Acute left MCA infarct embolic secondary to Atrial Fib s/p partial revascularization of left MCA distal M2 with TICI 2C Code Stroke CT head 1. Mild motion artifact, with no acute cortically based infarct or intracranial hemorrhage identified. ASPECTS 10. 2. Bilateral scalp hematomas.  No skull fracture identified. 3. Chronic right MCA infarct. Chronic bilateral white matter disease. CTA head & neck 1. Positive for Emergent Large Vessel Occlusion: Left MCA M2 posterior division near its origin. 2. Otherwise generalized vessel tortuosity but mild for age atherosclerosis in the head and neck. Mild to moderate left ICA siphon supraclinoid stenosis due to calcified plaque. But no other hemodynamically significant stenosis CT perfusion CBF (<30%) Volume: 64mL Perfusion (Tmax>6.0s) volume: 68mL. Hypoperfusion index 0.5. Mismatch Volume: 57mL. Infarction Location:Posterior left MCA territory. Cerebral angio S/P partial revascularization of occluded Lt MCA distal M2 branch with x1 pass with 30mmx 40 mm solitaire X retriever and contact aspiration,x1 pass with contact aspiration,x1 pass with [employer] retriever an contact aspiration and x1 pass with zoom 35 and contact aspiration achieving a TICI 2C revascularization  Post IR CT Mild post Lt perisylvian hyper density and Lt putamen contrast bush. Early signs of lt MCA hypodensity. No mass effect.  MRI  pending 2D Echo pending LDL pending HgbA1c pending VTE prophylaxis - SCD's    Diet   Diet NPO time specified   Eliquis (apixaban) daily prior to admission, now on No antithrombotic.  Therapy recommendations:  pending Disposition:  pending  Hypertension Home meds:  none UnStable On cleviprex gtt  SBP < 160 Long-term BP goal  normotensive  Hyperlipidemia Home meds:  none, LDL pending, goal < 70 Continue statin at discharge  Atrial Fib on Eliquis  Eliquis on hold  EKG with A fib  Echo pending   Diabetes type II Controlled Home meds:  none HgbA1c No results found for requested labs within last 26280 hours., goal < 7.0 CBGs Recent Labs    10/28/2021 0421  GLUCAP 115*    SSI  Other Stroke Risk Factors Advanced Age >/= 15  Hx of stroke Coronary artery disease Congestive heart failure  Other Active Problems Vascular dementia without behavioral disturbances  Hospital day # 0  Beulah Gandy, NP   STROKE MD NOTE :  I have personally obtained history,examined this patient, reviewed notes, independently viewed imaging studies, participated in medical decision making and plan of care.ROS completed by me personally and pertinent positives fully documented  I have made any additions or clarifications directly to the above note. Agree with note above.  Patient presented with global aphasia and right hemiplegia due to left M2 occlusion and underwent mechanical thrombectomy with only partial recanalization.  Clinical exam suggest large left MCA stroke.  Recommend close neurological monitoring and strict blood pressure control as per post intervention protocol.  Check MRI scan of the brain later today.  Long discussion with the patient's son at the bedside.  Prognosis is guarded.  He feels patient would not  want prolonged ventilatory support, feeding tube and nursing home care.  Hopefully she improves over the next few days otherwise we will have to have goals of care discussion.  Hold anticoagulation for now due to suspected large stroke discussed with patient's son and Dr. Estanislado Pandy and answered questions.This patient is critically ill and at significant risk of neurological worsening, death and care requires constant monitoring of vital signs, hemodynamics,respiratory and cardiac monitoring, extensive review of  multiple databases, frequent neurological assessment, discussion with family, other specialists and medical decision making of high complexity.I have made any additions or clarifications directly to the above note.This critical care time does not reflect procedure time, or teaching time or supervisory time of PA/NP/Med Resident etc but could involve care discussion time.  I spent 35 minutes of neurocritical care time  in the care of  this patient.      Antony Contras, MD Medical Director [place] Pager: [phone] 11/13/2021 5:23 PM   To contact Stroke Continuity provider, please refer to [email or web address] After hours, contact General Neurology

## 2021-10-27 NOTE — Progress Notes (Signed)
Able to doppler rt PT on rt foot with help from IR Ronaldo Miyamoto

## 2021-10-27 NOTE — Anesthesia Preprocedure Evaluation (Signed)
Anesthesia Evaluation  Patient identified by MRN, date of birth, ID band Patient confused    Reviewed: Allergy & Precautions, NPO status , Patient's Chart, lab work & pertinent test results  Airway Mallampati: II  TM Distance: >3 FB Neck ROM: Full    Dental  (+) Dental Advisory Given   Pulmonary COPD,    Pulmonary exam normal breath sounds clear to auscultation       Cardiovascular hypertension, Pt. on medications + CAD  + dysrhythmias Atrial Fibrillation  Rhythm:Irregular Rate:Normal     Neuro/Psych PSYCHIATRIC DISORDERS Dementia CVA    GI/Hepatic negative GI ROS, Neg liver ROS,   Endo/Other  negative endocrine ROS  Renal/GU negative Renal ROS     Musculoskeletal negative musculoskeletal ROS (+)   Abdominal   Peds  Hematology  (+) Blood dyscrasia, anemia ,   Anesthesia Other Findings   Reproductive/Obstetrics                             Anesthesia Physical Anesthesia Plan  ASA: 4 and emergent  Anesthesia Plan: General   Post-op Pain Management:    Induction: Intravenous  PONV Risk Score and Plan: 3 and Ondansetron, Dexamethasone and Treatment may vary due to age or medical condition  Airway Management Planned: Oral ETT  Additional Equipment: Arterial line  Intra-op Plan:   Post-operative Plan: Possible Post-op intubation/ventilation  Informed Consent: I have reviewed the patients History and Physical, chart, labs and discussed the procedure including the risks, benefits and alternatives for the proposed anesthesia with the patient or authorized representative who has indicated his/her understanding and acceptance.     Dental advisory given  Plan Discussed with: CRNA  Anesthesia Plan Comments:         Anesthesia Quick Evaluation

## 2021-10-27 NOTE — Progress Notes (Addendum)
Pt arrived to unit at 935.

## 2021-10-28 ENCOUNTER — Inpatient Hospital Stay (HOSPITAL_COMMUNITY): Payer: Medicare Other

## 2021-10-28 ENCOUNTER — Encounter (HOSPITAL_COMMUNITY): Payer: Self-pay | Admitting: Interventional Radiology

## 2021-10-28 ENCOUNTER — Ambulatory Visit: Payer: Medicare Other | Admitting: Medical

## 2021-10-28 DIAGNOSIS — I6602 Occlusion and stenosis of left middle cerebral artery: Secondary | ICD-10-CM | POA: Diagnosis not present

## 2021-10-28 LAB — BASIC METABOLIC PANEL
Anion gap: 12 (ref 5–15)
BUN: 32 mg/dL — ABNORMAL HIGH (ref 8–23)
CO2: 20 mmol/L — ABNORMAL LOW (ref 22–32)
Calcium: 8.5 mg/dL — ABNORMAL LOW (ref 8.9–10.3)
Chloride: 105 mmol/L (ref 98–111)
Creatinine, Ser: 1.94 mg/dL — ABNORMAL HIGH (ref 0.44–1.00)
GFR, Estimated: 23 mL/min — ABNORMAL LOW (ref >60)
Glucose, Bld: 155 mg/dL — ABNORMAL HIGH (ref 70–99)
Potassium: 4.1 mmol/L (ref 3.5–5.1)
Sodium: 137 mmol/L (ref 135–145)

## 2021-10-28 LAB — CBC WITH DIFFERENTIAL/PLATELET
Abs Immature Granulocytes: 0.03 10*3/uL (ref 0.00–0.07)
Basophils Absolute: 0 10*3/uL (ref 0.0–0.1)
Basophils Relative: 0 %
Eosinophils Absolute: 0 10*3/uL (ref 0.0–0.5)
Eosinophils Relative: 0 %
HCT: 29.3 % — ABNORMAL LOW (ref 36.0–46.0)
Hemoglobin: 9.9 g/dL — ABNORMAL LOW (ref 12.0–15.0)
Immature Granulocytes: 0 %
Lymphocytes Relative: 8 %
Lymphs Abs: 0.7 10*3/uL (ref 0.7–4.0)
MCH: 32.1 pg (ref 26.0–34.0)
MCHC: 33.8 g/dL (ref 30.0–36.0)
MCV: 95.1 fL (ref 80.0–100.0)
Monocytes Absolute: 0.5 10*3/uL (ref 0.1–1.0)
Monocytes Relative: 5 %
Neutro Abs: 7.9 10*3/uL — ABNORMAL HIGH (ref 1.7–7.7)
Neutrophils Relative %: 87 %
Platelets: 175 10*3/uL (ref 150–400)
RBC: 3.08 MIL/uL — ABNORMAL LOW (ref 3.87–5.11)
RDW: 13.4 % (ref 11.5–15.5)
WBC: 9.1 10*3/uL (ref 4.0–10.5)
nRBC: 0 % (ref 0.0–0.2)

## 2021-10-28 LAB — LIPID PANEL
Cholesterol: 126 mg/dL (ref 0–200)
HDL: 42 mg/dL (ref >40)
LDL Cholesterol: 60 mg/dL (ref 0–99)
Total CHOL/HDL Ratio: 3 RATIO
Triglycerides: 120 mg/dL (ref <150)
VLDL: 24 mg/dL (ref 0–40)

## 2021-10-28 LAB — HEMOGLOBIN A1C
Hgb A1c MFr Bld: 5.3 % (ref 4.8–5.6)
Mean Plasma Glucose: 105.41 mg/dL

## 2021-10-28 MED ORDER — MORPHINE SULFATE (CONCENTRATE) 10 MG/0.5ML PO SOLN
2.5000 mg | ORAL | Status: DC | PRN
  Administered 2021-10-29: 2.6 mg via SUBLINGUAL
  Filled 2021-10-28 (×2): qty 0.5

## 2021-10-28 MED ORDER — GLYCOPYRROLATE 0.2 MG/ML IJ SOLN
0.2000 mg | INTRAMUSCULAR | Status: DC | PRN
  Administered 2021-10-29: 0.2 mg via INTRAVENOUS
  Filled 2021-10-28: qty 1

## 2021-10-28 MED ORDER — ONDANSETRON HCL 4 MG/2ML IJ SOLN: 4.0000 mg | Freq: Four times a day (QID) | INTRAMUSCULAR | Status: DC | PRN

## 2021-10-28 MED ORDER — ORAL CARE MOUTH RINSE
15.0000 mL | Freq: Two times a day (BID) | OROMUCOSAL | Status: DC
  Administered 2021-10-28 (×2): 15 mL via OROMUCOSAL

## 2021-10-28 MED ORDER — POLYVINYL ALCOHOL 1.4 % OP SOLN: 1.0000 [drp] | Freq: Four times a day (QID) | OPHTHALMIC | Status: DC | PRN

## 2021-10-28 MED ORDER — HALOPERIDOL LACTATE 2 MG/ML PO CONC
0.5000 mg | ORAL | Status: DC | PRN
  Filled 2021-10-28: qty 0.3

## 2021-10-28 MED ORDER — HALOPERIDOL 0.5 MG PO TABS
0.5000 mg | ORAL_TABLET | ORAL | Status: DC | PRN
  Filled 2021-10-28: qty 1

## 2021-10-28 MED ORDER — GLYCOPYRROLATE 1 MG PO TABS
1.0000 mg | ORAL_TABLET | ORAL | Status: DC | PRN
  Filled 2021-10-28: qty 1

## 2021-10-28 MED ORDER — HALOPERIDOL LACTATE 5 MG/ML IJ SOLN: 0.5000 mg | INTRAMUSCULAR | Status: DC | PRN

## 2021-10-28 MED ORDER — ACETAMINOPHEN 325 MG PO TABS: 650.0000 mg | ORAL_TABLET | Freq: Four times a day (QID) | ORAL | Status: DC | PRN

## 2021-10-28 MED ORDER — MORPHINE SULFATE (CONCENTRATE) 10 MG/0.5ML PO SOLN
2.5000 mg | ORAL | Status: DC | PRN
  Filled 2021-10-28: qty 0.5

## 2021-10-28 MED ORDER — GLYCOPYRROLATE 0.2 MG/ML IJ SOLN: 0.2000 mg | INTRAMUSCULAR | Status: DC | PRN

## 2021-10-28 MED ORDER — DIPHENHYDRAMINE HCL 50 MG/ML IJ SOLN: 12.5000 mg | INTRAMUSCULAR | Status: DC | PRN

## 2021-10-28 MED ORDER — BIOTENE DRY MOUTH MT LIQD: 15.0000 mL | OROMUCOSAL | Status: DC | PRN

## 2021-10-28 MED ORDER — ACETAMINOPHEN 650 MG RE SUPP: 650.0000 mg | Freq: Four times a day (QID) | RECTAL | Status: DC | PRN

## 2021-10-28 MED ORDER — ALBUTEROL SULFATE (2.5 MG/3ML) 0.083% IN NEBU: 2.5000 mg | INHALATION_SOLUTION | RESPIRATORY_TRACT | Status: DC | PRN

## 2021-10-28 MED ORDER — CHLORHEXIDINE GLUCONATE 0.12 % MT SOLN
15.0000 mL | Freq: Two times a day (BID) | OROMUCOSAL | Status: DC
  Administered 2021-10-28 (×2): 15 mL via OROMUCOSAL

## 2021-10-28 MED ORDER — ONDANSETRON 4 MG PO TBDP: 4.0000 mg | ORAL_TABLET | Freq: Four times a day (QID) | ORAL | Status: DC | PRN

## 2021-10-28 NOTE — Progress Notes (Signed)
SLP Cancellation Note  Patient Details Name: Latoya Merritt MRN: [id number] DOB: 01/24/1920   Cancelled treatment:         Pt's sats dropped earlier, on non rebreather. Checked with pt's RN - pt not appropriate at this time for swallow assessment. Will plan to check on tomorrow.    Royce Macadamia 10/28/2021, 1:27 PM Breck Coons Lonell Face.Ed Nurse, children's [phone] Office [phone]

## 2021-10-28 NOTE — Evaluation (Signed)
Physical Therapy Evaluation Patient Details Name: Latoya Merritt MRN: [id number] DOB: [birth year] Today's Date: 10/28/2021  History of Present Illness  Latoya Merritt is a 87 y.o. female who presents from home, where she lives alone, with R sided weakness and aphasia. CT showed L M2 occlusion, underwent revascularization on 11/17/2021.  PMH:  hypertension, hyperlipidemia, atrial fibrillation on Eliquis, CAD, DVT  Clinical Impression  Pt admitted with above diagnosis. Pt minimally responsive on eval. Opened eyes at times, had L gaze preference throughout and did not seem to be focusing. No active mvmt noted RUE or RLE. Recommend R PRAFO. Pt rolled as well as sitting up with bed in chair position with no trunk activation or participation. SPO2 dropped to 80% and RN placed NRB with return to 98%. HR maintained in low 100's. Will put her on a trial of PT to see if she begins to make recovery, if not, will sign off.  Pt currently with functional limitations due to the deficits listed below (see PT Problem List). Pt will benefit from skilled PT to increase their independence and safety with mobility to allow discharge to the venue listed below.          Recommendations for follow up therapy are one component of a multi-disciplinary discharge planning process, led by the attending physician.  Recommendations may be updated based on patient status, additional functional criteria and insurance authorization.  Follow Up Recommendations Skilled nursing-short term rehab (<3 hours/day)    Assistance Recommended at Discharge Frequent or constant Supervision/Assistance  Functional Status Assessment Patient has had a recent decline in their functional status and/or demonstrates limited ability to make significant improvements in function in a reasonable and predictable amount of time  Equipment Recommendations  Other (comment) (TBD)    Recommendations for Other Services       Precautions / Restrictions  Precautions Precautions: Other (comment) Precaution Comments: watch HR and O2 sats Restrictions Weight Bearing Restrictions: No      Mobility  Bed Mobility Overal bed mobility: Needs Assistance Bed Mobility: Rolling Rolling: Max assist         General bed mobility comments: pt did not participate in rolling, did not resist either.    Transfers                   General transfer comment: placed pt's bed in chair position, to did not show core activation in this position, R lean noted with no correction. Pt's eyes opened more in chair and moving RUE and RLE when given tactile stimulation but otherwise not participating    Ambulation/Gait               General Gait Details: unable  Stairs            Wheelchair Mobility    Modified Rankin (Stroke Patients Only) Modified Rankin (Stroke Patients Only) Pre-Morbid Rankin Score: No symptoms Modified Rankin: Severe disability     Balance Overall balance assessment: Needs assistance   Sitting balance-Leahy Scale: Zero                                       Pertinent Vitals/Pain Pain Assessment: Faces Faces Pain Scale: No hurt Facial Expression: Relaxed, neutral Body Movements: Absence of movements Muscle Tension: Relaxed    Home Living Family/patient expects to be discharged to:: Private residence Living Arrangements: Alone  Additional Comments: no family available but per chart pt lived alone, family helped with meals and meds and pt was ambulatory even raking leaves recently, sometimes used cane    Prior Function Prior Level of Function : Independent/Modified Independent                     Hand Dominance        Extremity/Trunk Assessment   Upper Extremity Assessment Upper Extremity Assessment: Defer to OT evaluation    Lower Extremity Assessment Lower Extremity Assessment: RLE deficits/detail RLE Deficits / Details: no noted active mvmt  of RLE    Cervical / Trunk Assessment Cervical / Trunk Assessment: Kyphotic  Communication   Communication: Receptive difficulties;Expressive difficulties  Cognition Arousal/Alertness: Lethargic Behavior During Therapy: Flat affect Overall Cognitive Status: Difficult to assess                                 General Comments: pt minimally responsive throughout session, occasionally opened eyes but seemed unable to focus, consistent L gaze preference. Did not attempt to verbalize and did not follow any commands        General Comments General comments (skin integrity, edema, etc.): SPO2 dropping to 80% when pt more awake, placed on NRB and sats increased to 98%. HR running low 100's to 110's    Exercises     Assessment/Plan    PT Assessment Patient needs continued PT services  PT Problem List Decreased strength;Decreased range of motion;Decreased activity tolerance;Decreased balance;Decreased mobility;Decreased coordination;Decreased cognition;Decreased knowledge of use of DME;Decreased safety awareness;Decreased knowledge of precautions;Cardiopulmonary status limiting activity       PT Treatment Interventions Therapeutic activities;Therapeutic exercise;Balance training;Neuromuscular re-education;Cognitive remediation;Patient/family education;Functional mobility training    PT Goals (Current goals can be found in the Care Plan section)  Acute Rehab PT Goals Patient Stated Goal: no verbalization PT Goal Formulation: Patient unable to participate in goal setting Time For Goal Achievement: 11/11/21 Potential to Achieve Goals: Fair    Frequency Min 4X/week   Barriers to discharge        Co-evaluation               AM-PAC PT "6 Clicks" Mobility  Outcome Measure Help needed turning from your back to your side while in a flat bed without using bedrails?: Total Help needed moving from lying on your back to sitting on the side of a flat bed without using  bedrails?: Total Help needed moving to and from a bed to a chair (including a wheelchair)?: Total Help needed standing up from a chair using your arms (e.g., wheelchair or bedside chair)?: Total Help needed to walk in hospital room?: Total Help needed climbing 3-5 steps with a railing? : Total 6 Click Score: 6    End of Session Equipment Utilized During Treatment: Oxygen Activity Tolerance: Treatment limited secondary to medical complications (Comment) Patient left: in bed;with call bell/phone within reach Nurse Communication: Mobility status PT Visit Diagnosis: Hemiplegia and hemiparesis Hemiplegia - Right/Left: Right Hemiplegia - dominant/non-dominant: Dominant Hemiplegia - caused by: Cerebral infarction    Time: 1610-9604 PT Time Calculation (min) (ACUTE ONLY): 19 min   Charges:   PT Evaluation $PT Eval Moderate Complexity: 1 Mod          [place], PT  Acute Rehab Services  Pager [phone] Office [phone]   Lawana Chambers Jaramiah Bossard 10/28/2021, 11:41 AM

## 2021-10-28 NOTE — Progress Notes (Signed)
STROKE TEAM PROGRESS NOTE   INTERVAL HISTORY Her son is at the bedside.  Patient  .  Became hypoxic overnight and is put on nonrebreather oxygen.  She is also become more tachycardic and increased work of breathing.  Patient i remains globally aphasic. Moves left arm spontaneously. Right side neglect.   Patient's son understands poor prognosis and agrees to DNR and wants comfort care measures only.  Vitals:   10/28/21 1210 10/28/21 1215 10/28/21 1300 10/28/21 1342  BP: 125/66 131/61 128/62   Pulse: (!) 103 98 92 95  Resp: (!) 32 (!) 27 (!) 31 (!) 30  Temp:      TempSrc:      SpO2: 99% 93% 99% 91%   CBC:  Recent Labs  Lab 2021-11-06 0425 11/06/21 0451 10/28/21 0858  WBC 7.4  --  9.1  NEUTROABS 4.4  --  7.9*  HGB 11.0* 11.2* 9.9*  HCT 32.7* 33.0* 29.3*  MCV 95.3  --  95.1  PLT 175  --  175   Basic Metabolic Panel:  Recent Labs  Lab 11-06-2021 0425 06-Nov-2021 0451 10/28/21 0858  NA 136 137 137  K 3.2* 3.1* 4.1  CL 98 97* 105  CO2 27  --  20*  GLUCOSE 138* 129* 155*  BUN 24* 25* 32*  CREATININE 1.41* 1.30* 1.94*  CALCIUM 8.9  --  8.5*   Lipid Panel:  Recent Labs  Lab 10/28/21 0858  CHOL 126  TRIG 120  HDL 42  CHOLHDL 3.0  VLDL 24  LDLCALC 60   HgbA1c:  Recent Labs  Lab 10/28/21 0858  HGBA1C 5.3   Urine Drug Screen: No results for input(s): LABOPIA, COCAINSCRNUR, LABBENZ, AMPHETMU, THCU, LABBARB in the last 168 hours.  Alcohol Level  Recent Labs  Lab 2021/11/06 1000  ETH <10    IMAGING past 24 hours MR BRAIN WO CONTRAST  Result Date: 10/28/2021 CLINICAL DATA:  Follow-up examination for acute stroke, status post mechanical thrombectomy. EXAM: MRI HEAD WITHOUT CONTRAST TECHNIQUE: Multiplanar, multiecho pulse sequences of the brain and surrounding structures were obtained without intravenous contrast. COMPARISON:  Prior CTs from 11/06/2021. FINDINGS: Brain: Generalized age-related cerebral atrophy. Patchy and confluent T2/FLAIR hyperintensity involving the  periventricular and deep white matter both cerebral hemispheres as well as the pons, most consistent with chronic small vessel ischemic disease. Patchy and confluent restricted diffusion involving the posterior left frontoparietal and temporal occipital region, consistent with acute left MCA distribution infarct, overall moderate to large in size. Associated susceptibility artifact centered about the left sylvian fissure suspicious for a small amount of associated petechial and/or subarachnoid hemorrhage (series 12, image 29), also seen on prior postprocedural CT. No significant mass effect. No other evidence for acute or subacute ischemia. Gray-white matter differentiation otherwise maintained. Few additional scattered foci of susceptibility artifact noted at the right temporal occipital region, suggesting chronic blood products. No definite acute hemorrhage seen at this location on prior CT. No mass lesion or midline shift. No hydrocephalus or extra-axial fluid collection. Pituitary gland and suprasellar region within normal limits. Midline structures intact. Vascular: Major intracranial vascular flow voids are maintained. Skull and upper cervical spine: Craniocervical junction within normal limits. Multilevel degenerative spondylosis with associated grade 1 anterolisthesis of C3 on C4 noted within the visualized upper cervical spine. Bone marrow signal intensity within normal limits. Small wall vein right posterior scalp contusion. Sinuses/Orbits: Left gaze noted. Patient status post bilateral ocular lens replacement. Moderate mucosal thickening noted throughout the paranasal sinuses. Trace left mastoid effusion, of  doubtful significance. Inner ear structures grossly normal. Other: None. IMPRESSION: 1. Evolving moderate to large acute left posterior MCA distribution infarct. Associated susceptibility artifact centered about the left Sylvian fissure suspicious for associated petechial and/or subarachnoid  hemorrhage, also seen on prior postprocedural CT. No significant mass effect. 2. No other acute intracranial abnormality. 3. Underlying age-related cerebral atrophy with chronic small vessel ischemic disease. 4. Evolving right posterior scalp contusion. Electronically Signed   By: Jeannine Boga M.D.   On: 10/28/2021 03:33   ECHOCARDIOGRAM COMPLETE  Result Date: 11/16/2021    ECHOCARDIOGRAM REPORT   Patient Name:   Latoya Merritt Date of Exam: 10/26/2021 Medical Rec #:  [id number]        Height:       68.0 in Accession #:    [id number]       Weight:       105.4 lb Date of Birth:  08-14-1920         BSA:          1.557 m Patient Age:    87 years         BP:           142/78 mmHg Patient Gender: F                HR:           77 bpm. Exam Location:  Inpatient Procedure: 2D Echo, Cardiac Doppler and Color Doppler Indications:    Stroke 434.91 / I163.9  History:        Patient has prior history of Echocardiogram examinations, most                 recent 10/22/2012. Arrythmias:Atrial Fibrillation.  Sonographer:    Merrie Roof RDCS Referring Phys: [phone] MCNEILL P [place]  1. Global hypokinesis worse in septum and inferior wall. Left ventricular ejection fraction, by estimation, is 30 to 35%. The left ventricle has moderately decreased function. The left ventricle demonstrates global hypokinesis. The left ventricular internal cavity size was mildly dilated. There is moderate asymmetric left ventricular hypertrophy of the basal and septal segments. Left ventricular diastolic parameters are indeterminate.  2. Right ventricular systolic function is normal. The right ventricular size is normal. There is normal pulmonary artery systolic pressure.  3. Left atrial size was severely dilated.  4. Right atrial size was moderately dilated.  5. The pericardial effusion is posterior to the left ventricle.  6. The mitral valve is degenerative. Mild mitral valve regurgitation. No evidence of mitral stenosis.  Moderate mitral annular calcification.  7. The aortic valve is tricuspid. There is moderate calcification of the aortic valve. Aortic valve regurgitation is mild. Mild to moderate aortic valve sclerosis/calcification is present, without any evidence of aortic stenosis.  8. The inferior vena cava is normal in size with greater than 50% respiratory variability, suggesting right atrial pressure of 3 mmHg. FINDINGS  Left Ventricle: Global hypokinesis worse in septum and inferior wall. Left ventricular ejection fraction, by estimation, is 30 to 35%. The left ventricle has moderately decreased function. The left ventricle demonstrates global hypokinesis. The left ventricular internal cavity size was mildly dilated. There is moderate asymmetric left ventricular hypertrophy of the basal and septal segments. Left ventricular diastolic parameters are indeterminate. Right Ventricle: The right ventricular size is normal. No increase in right ventricular wall thickness. Right ventricular systolic function is normal. There is normal pulmonary artery systolic pressure. The tricuspid regurgitant velocity is 2.20 m/s, and  with an assumed right  atrial pressure of 3 mmHg, the estimated right ventricular systolic pressure is [id number] mmHg. Left Atrium: Left atrial size was severely dilated. Right Atrium: Right atrial size was moderately dilated. Pericardium: Trivial pericardial effusion is present. The pericardial effusion is posterior to the left ventricle. Mitral Valve: The mitral valve is degenerative in appearance. There is moderate thickening of the mitral valve leaflet(s). There is moderate calcification of the mitral valve leaflet(s). Moderate mitral annular calcification. Mild mitral valve regurgitation. No evidence of mitral valve stenosis. Tricuspid Valve: The tricuspid valve is normal in structure. Tricuspid valve regurgitation is mild . No evidence of tricuspid stenosis. Aortic Valve: The aortic valve is tricuspid. There is  moderate calcification of the aortic valve. Aortic valve regurgitation is mild. Mild to moderate aortic valve sclerosis/calcification is present, without any evidence of aortic stenosis. Pulmonic Valve: The pulmonic valve was normal in structure. Pulmonic valve regurgitation is not visualized. No evidence of pulmonic stenosis. Aorta: The aortic root is normal in size and structure. Venous: The inferior vena cava is normal in size with greater than 50% respiratory variability, suggesting right atrial pressure of 3 mmHg. IAS/Shunts: No atrial level shunt detected by color flow Doppler.  LEFT VENTRICLE PLAX 2D LVIDd:         2.04 cm LVIDs:         1.58 cm LV PW:         0.42 cm LV IVS:        1.42 cm LVOT diam:     1.40 cm LV SV:         21 LV SV Index:   14 LVOT Area:     1.54 cm  LEFT ATRIUM              Index LA diam:        3.20 cm  2.06 cm/m LA Vol (A2C):   174.0 ml 111.78 ml/m LA Vol (A4C):   178.0 ml 114.35 ml/m LA Biplane Vol: 185.0 ml 118.84 ml/m  AORTIC VALVE LVOT Vmax:   92.50 cm/s LVOT Vmean:  58.500 cm/s LVOT VTI:    0.139 m  AORTA Ao Root diam: 3.10 cm Ao Asc diam:  3.20 cm MITRAL VALVE                TRICUSPID VALVE MV Area (PHT): 5.93 cm     TR Peak grad:   19.4 mmHg MV Decel Time: 128 msec     TR Vmax:        220.00 cm/s MV E velocity: 131.00 cm/s MV A velocity: 41.00 cm/s   SHUNTS MV E/A ratio:  3.20         Systemic VTI:  0.14 m                             Systemic Diam: 1.40 cm Jenkins Rouge MD Electronically signed by Jenkins Rouge MD Signature Date/Time: 10/24/2021/5:37:27 PM    Final     PHYSICAL EXAM  Temp:  [98 F (36.7 C)-102.3 F (39.1 C)] 102.3 F (39.1 C) (11/08 1200) Pulse Rate:  [80-120] 95 (11/08 1342) Resp:  [16-40] 30 (11/08 1342) BP: (125-150)/(61-91) 128/62 (11/08 1300) SpO2:  [80 %-100 %] 91 % (11/08 1342)  General -frail elderly Caucasian lady, in mild respiratory  distress on facemask oxygen.  Ophthalmologic - fundi not visualized due to  noncooperation.  Cardiovascular - A fib on monitor  Mental Status -  Lethargic, globally aphasic, not  following commands.  [place].  Cranial Nerves II - XII - II - no blink to threat on right  III, IV, VI - left gaze deviation.  Will not look to the right VII - right facial droop VIII - Hearing & vestibular intact bilaterally. X unable to assess  XI - unable to assess XII -unable to assess   Motor Strength - dense right hemiplegia. Moves left upper spontaneously withdraws left lower extremity briskly and right left lower extremity slightly to pain.  Right upper extremity is flaccid and no response to pain Motor Tone - Muscle tone was assessed at the neck and appendages and was normal.  Sensory - no response to painful stimuli on right side   Coordination - unable to assess  Tremor was absent.  Gait and Station - deferred.   ASSESSMENT/PLAN Latoya Merritt is a 87 y.o. female with history of hypertension, hyperlipidemia, CAD, atrial fibrillation on Eliquis, CKD stage 3, DVT who presents with right-sided weakness and aphasia.  She was in her normal state of health at 9 PM, and then her son states that she got up to go to the bathroom and then he heard her fall on the way back  Stroke: Acute left MCA infarct embolic secondary to Atrial Fib s/p partial revascularization of left MCA distal M2 with TICI 2C Code Stroke CT head 1. Mild motion artifact, with no acute cortically based infarct or intracranial hemorrhage identified. ASPECTS 10. 2. Bilateral scalp hematomas.  No skull fracture identified. 3. Chronic right MCA infarct. Chronic bilateral white matter disease. CTA head & neck 1. Positive for Emergent Large Vessel Occlusion: Left MCA M2 posterior division near its origin. 2. Otherwise generalized vessel tortuosity but mild for age atherosclerosis in the head and neck. Mild to moderate left ICA siphon supraclinoid stenosis due to calcified plaque. But no other hemodynamically  significant stenosis CT perfusion CBF (<30%) Volume: 44mL Perfusion (Tmax>6.0s) volume: 3mL. Hypoperfusion index 0.5. Mismatch Volume: 41mL. Infarction Location:Posterior left MCA territory. Cerebral angio S/P partial revascularization of occluded Lt MCA distal M2 branch with x1 pass with 2mmx 40 mm solitaire X retriever and contact aspiration,x1 pass with contact aspiration,x1 pass with [employer] retriever an contact aspiration and x1 pass with zoom 35 and contact aspiration achieving a TICI 2C revascularization  Post IR CT Mild post Lt perisylvian hyper density and Lt putamen contrast bush. Early signs of lt MCA hypodensity. No mass effect.  MRI evolving moderate to large sized left posterior division MCA infarct.  Slight petechial hemorrhage in the subarachnoid's space and sylvian fissure  2D Echo Global hypokinesis in the inferior wall and septum.  Diminished ejection fraction 30 to 35%. LDL 60 mg percent  HgbA1c 5.3  VTE prophylaxis - SCD's    Diet   Diet NPO time specified   Eliquis (apixaban) daily prior to admission, now on No antithrombotic.  Therapy recommendations:  pending Disposition:  pending  Hypertension Home meds:  none UnStable On cleviprex gtt  SBP < 160 Long-term BP goal normotensive  Hyperlipidemia Home meds:  none, LDL pending, goal < 70 Continue statin at discharge  Atrial Fib on Eliquis  Eliquis on hold  EKG with A fib  Echo pending   Diabetes type II Controlled Home meds:  none HgbA1c No results found for requested labs within last 26280 hours., goal < 7.0 CBGs Recent Labs    11/03/2021 0421  GLUCAP 115*    SSI  Other Stroke Risk Factors Advanced Age >/= 52  Hx of stroke Coronary artery disease Congestive heart failure  Other Active Problems Vascular dementia without behavioral disturbances  Hospital day # 1    Patient presented with global aphasia and right hemiplegia due to left M2 occlusion and underwent mechanical thrombectomy with  only partial recanalization.  Clinical exam and MRI scan suggest large left MCA stroke.  Patient's prognosis is quite poor and she will unlikely to survive without significant prolonged nursing care, 24-hour care and feeding tube..  Long discussion with the patient's son at the bedside.   Marland Kitchen  He feels patient would not want prolonged ventilatory support, feeding tube and 24-hour nursing home care.  Which would be unavoidable at this point.  .He agrees to DNR and comfort care measures only.  We will stop aggressive care and transition her to comfort measures only.  Transfer to neurology floor bed when available.  This patient is critically ill and at significant risk of neurological worsening, death and care requires constant monitoring of vital signs, hemodynamics,respiratory and cardiac monitoring, extensive review of multiple databases, frequent neurological assessment, discussion with family, other specialists and medical decision making of high complexity.I have made any additions or clarifications directly to the above note.This critical care time does not reflect procedure time, or teaching time or supervisory time of PA/NP/Med Resident etc but could involve care discussion time.  I spent 32 minutes of neurocritical care time  in the care of  this patient.      Antony Contras, MD Medical Director [place] Pager: [phone] 10/28/2021 2:04 PM   To contact Stroke Continuity provider, please refer to [email or web address] After hours, contact General Neurology

## 2021-10-28 NOTE — Progress Notes (Signed)
Referring Physician(s): Code Stroke  Supervising Physician: Julieanne Cotton  Patient Status:  [facility] - In-pt  Chief Complaint: Occluded left MCA distal M2 branch s/p revascularization 01-Nov-2021  Subjective: Patient in bed asleep but briefly opened eyes. She did not follow any commands; brief spontaneous movement of left arm observed.   Allergies: Amoxicillin-pot clavulanate, Cephalexin, Clarithromycin, Clarithromycin, Clindamycin/lincomycin, Lansoprazole, Macrodantin, Sulfa antibiotics, Tussionex pennkinetic er [hydrocod polst-cpm polst er], Amoxicillin, Cephalexin, Clindamycin, Hydrocodone-chlorpheniramine, Lansoprazole, Lisinopril, Nitrofurantoin, and Other  Medications: Prior to Admission medications   Medication Sig Start Date End Date Taking? Authorizing Provider  acetaminophen (TYLENOL) 500 MG tablet Take 500 mg by mouth every 6 (six) hours as needed for pain.   Yes [provider]  brinzolamide (AZOPT) 1 % ophthalmic suspension Place 1 drop into the left eye daily.   Yes [provider]  carboxymethylcellulose (REFRESH PLUS) 0.5 % SOLN 1 drop 3 (three) times daily as needed.   Yes [provider]  cyanocobalamin 1000 MCG tablet Take 1,000 mcg by mouth daily.   Yes [provider]  ELIQUIS 2.5 MG TABS tablet TAKE 1 TABLET BY MOUTH  TWICE DAILY 11/29/20  Yes Iran Ouch, MD  furosemide (LASIX) 20 MG tablet Take 1 tablet (20 mg total) by mouth daily. 09/08/21 09/08/22 Yes Visser, Jacquelyn D, PA-C  guaiFENesin (MUCINEX) 600 MG 12 hr tablet Take 1 tablet (600 mg total) by mouth 2 (two) times daily for 7 days. 10/24/21 10/31/21 Yes Eden Emms, NP  ipratropium (ATROVENT) 0.06 % nasal spray Place 2 sprays into the nose daily as needed for rhinitis.   Yes [provider]  isosorbide mononitrate (IMDUR) 30 MG 24 hr tablet Take 1 tablet (30 mg total) by mouth daily. 07/03/21  Yes Iran Ouch, MD  mirtazapine (REMERON) 7.5 MG tablet  Take 1 tablet (7.5 mg total) by mouth at bedtime. 07/18/21  Yes Sherlene Shams, MD  potassium chloride (KLOR-CON) 10 MEQ tablet Take 1 tablet (10 mEq total) by mouth daily when taking your lasix (fluid pill). 09/08/21  Yes Marisue Ivan D, PA-C  mupirocin ointment (BACTROBAN) 2 % Apply 1 application topically 2 (two) times daily. Patient not taking: Reported on 11-01-21 05/29/21   McLean-Scocuzza, Pasty Spillers, MD     Vital Signs: BP 133/66   Pulse 92   Temp (!) 101.9 F (38.8 C) (Axillary)   Resp (!) 26   SpO2 92%   Physical Exam Constitutional:      General: She is not in acute distress.    Appearance: She is ill-appearing.     Comments: Asleep but opened eyes briefly during exam.   Cardiovascular:     Rate and Rhythm: Normal rate and regular rhythm.     Comments: Right CFA vascular site is clean and dry.  Pulmonary:     Effort: Pulmonary effort is normal.  Skin:    General: Skin is warm and dry.  Neurological:     Comments: Non-verbal. Did not follow commands. Spontaneous left arm movement. Left gaze preference    Imaging: MR BRAIN WO CONTRAST  Result Date: 10/28/2021 CLINICAL DATA:  Follow-up examination for acute stroke, status post mechanical thrombectomy. EXAM: MRI HEAD WITHOUT CONTRAST TECHNIQUE: Multiplanar, multiecho pulse sequences of the brain and surrounding structures were obtained without intravenous contrast. COMPARISON:  Prior CTs from 11-01-2021. FINDINGS: Brain: Generalized age-related cerebral atrophy. Patchy and confluent T2/FLAIR hyperintensity involving the periventricular and deep white matter both cerebral hemispheres as well as the pons, most consistent with  chronic small vessel ischemic disease. Patchy and confluent restricted diffusion involving the posterior left frontoparietal and temporal occipital region, consistent with acute left MCA distribution infarct, overall moderate to large in size. Associated susceptibility artifact centered about the left  sylvian fissure suspicious for a small amount of associated petechial and/or subarachnoid hemorrhage (series 12, image 29), also seen on prior postprocedural CT. No significant mass effect. No other evidence for acute or subacute ischemia. Gray-white matter differentiation otherwise maintained. Few additional scattered foci of susceptibility artifact noted at the right temporal occipital region, suggesting chronic blood products. No definite acute hemorrhage seen at this location on prior CT. No mass lesion or midline shift. No hydrocephalus or extra-axial fluid collection. Pituitary gland and suprasellar region within normal limits. Midline structures intact. Vascular: Major intracranial vascular flow voids are maintained. Skull and upper cervical spine: Craniocervical junction within normal limits. Multilevel degenerative spondylosis with associated grade 1 anterolisthesis of C3 on C4 noted within the visualized upper cervical spine. Bone marrow signal intensity within normal limits. Small wall vein right posterior scalp contusion. Sinuses/Orbits: Left gaze noted. Patient status post bilateral ocular lens replacement. Moderate mucosal thickening noted throughout the paranasal sinuses. Trace left mastoid effusion, of doubtful significance. Inner ear structures grossly normal. Other: None. IMPRESSION: 1. Evolving moderate to large acute left posterior MCA distribution infarct. Associated susceptibility artifact centered about the left Sylvian fissure suspicious for associated petechial and/or subarachnoid hemorrhage, also seen on prior postprocedural CT. No significant mass effect. 2. No other acute intracranial abnormality. 3. Underlying age-related cerebral atrophy with chronic small vessel ischemic disease. 4. Evolving right posterior scalp contusion. Electronically Signed   By: Jeannine Boga M.D.   On: 10/28/2021 03:33   CT CEREBRAL PERFUSION W CONTRAST  Result Date: 10/22/2021 CLINICAL DATA:   87 year old female.  Code stroke. EXAM: CT ANGIOGRAPHY HEAD AND NECK CT PERFUSION BRAIN TECHNIQUE: Multidetector CT imaging of the head and neck was performed using the standard protocol during bolus administration of intravenous contrast. Multiplanar CT image reconstructions and MIPs were obtained to evaluate the vascular anatomy. Carotid stenosis measurements (when applicable) are obtained utilizing NASCET criteria, using the distal internal carotid diameter as the denominator. Multiphase CT imaging of the brain was performed following IV bolus contrast injection. Subsequent parametric perfusion maps were calculated using RAPID software. CONTRAST:  11mL OMNIPAQUE IOHEXOL 350 MG/ML SOLN COMPARISON:  Plain head CT 0431 hours. FINDINGS: CT Brain Perfusion Findings: ASPECTS: 10 CBF (<30%) Volume: 12mL Perfusion (Tmax>6.0s) volume: 20mL.  Hypoperfusion index 0.5. Mismatch Volume: 80mL Infarction Location:Posterior left MCA territory. CTA NECK Skeleton: No acute osseous abnormality identified. No skull fracture identified despite scalp hematoma. Cervical spine degeneration. Upper chest: Small layering left pleural effusion. Other neck: Small volume retained secretions in the right nasopharynx. No acute neck soft tissue finding. Aortic arch: 3 vessel arch configuration. Tortuous arch and proximal great vessels with mild calcified plaque. Right carotid system: Tortuous right CCA. Minor plaque at the right carotid bifurcation mostly affecting the ECA. Tortuosity of the right ICA without stenosis. Left carotid system: Similar tortuosity with minor plaque and no stenosis. Vertebral arteries: Similar tortuosity and mild plaque.  No stenosis to the skull base. CTA HEAD Posterior circulation: Patent distal vertebral arteries with mild tortuosity, no plaque or stenosis. Patent PICA origins. Fenestrated vertebrobasilar junction (normal variant) without stenosis. Patent basilar artery without stenosis. Patent SCA and PCA origins.  Posterior communicating arteries are diminutive or absent. Bilateral PCA branches are within normal limits. Anterior circulation: Both ICA siphons are patent  with calcified plaque. On the right there is only mild siphon stenosis. On the left there is mild to moderate supraclinoid stenosis best demonstrated on series 9, image 92. Patent carotid termini. Patent MCA and ACA origins. Anterior communicating artery, bilateral ACA branches, right MCA M1 segment, and right MCA branches appear within normal limits. Left MCA M1 segment is patent past the anterior temporal artery and to the MCA bifurcation but there is a proximal right M2 branch occlusion on series 9, image 125 which appears to be the dominant posterior branch. See also series 12, image 25 and series 10, image 18. Venous sinuses: Patent. Anatomic variants: None significant. Review of the MIP images confirms the above findings IMPRESSION: 1. Positive for Emergent Large Vessel Occlusion: Left MCA M2 posterior division near its origin. 2. Otherwise generalized vessel tortuosity but mild for age atherosclerosis in the head and neck. Mild to moderate left ICA siphon supraclinoid stenosis due to calcified plaque. But no other hemodynamically significant stenosis. Preliminary report of the above communicated to Dr. Leonel Ramsay at 4:56 am on 11/02/2021 by text page via the [facility] messaging system. Electronically Signed   By: Genevie Ann M.D.   On: 11/07/2021 05:01   ECHOCARDIOGRAM COMPLETE  Result Date: 11/10/2021    ECHOCARDIOGRAM REPORT   Patient Name:   JAKEYLA FRIEBEL Date of Exam: 10/21/2021 Medical Rec #:  [id number]        Height:       68.0 in Accession #:    [id number]       Weight:       105.4 lb Date of Birth:  [birth year]         BSA:          1.557 m Patient Age:    29 years         BP:           142/78 mmHg Patient Gender: F                HR:           77 bpm. Exam Location:  Inpatient Procedure: 2D Echo, Cardiac Doppler and Color Doppler Indications:     Stroke 434.91 / I163.9  History:        Patient has prior history of Echocardiogram examinations, most                 recent 10/22/2012. Arrythmias:Atrial Fibrillation.  Sonographer:    Merrie Roof RDCS Referring Phys: [phone] MCNEILL P [place]  1. Global hypokinesis worse in septum and inferior wall. Left ventricular ejection fraction, by estimation, is 30 to 35%. The left ventricle has moderately decreased function. The left ventricle demonstrates global hypokinesis. The left ventricular internal cavity size was mildly dilated. There is moderate asymmetric left ventricular hypertrophy of the basal and septal segments. Left ventricular diastolic parameters are indeterminate.  2. Right ventricular systolic function is normal. The right ventricular size is normal. There is normal pulmonary artery systolic pressure.  3. Left atrial size was severely dilated.  4. Right atrial size was moderately dilated.  5. The pericardial effusion is posterior to the left ventricle.  6. The mitral valve is degenerative. Mild mitral valve regurgitation. No evidence of mitral stenosis. Moderate mitral annular calcification.  7. The aortic valve is tricuspid. There is moderate calcification of the aortic valve. Aortic valve regurgitation is mild. Mild to moderate aortic valve sclerosis/calcification is present, without any evidence of aortic stenosis.  8. The inferior vena cava is  normal in size with greater than 50% respiratory variability, suggesting right atrial pressure of 3 mmHg. FINDINGS  Left Ventricle: Global hypokinesis worse in septum and inferior wall. Left ventricular ejection fraction, by estimation, is 30 to 35%. The left ventricle has moderately decreased function. The left ventricle demonstrates global hypokinesis. The left ventricular internal cavity size was mildly dilated. There is moderate asymmetric left ventricular hypertrophy of the basal and septal segments. Left ventricular diastolic parameters are  indeterminate. Right Ventricle: The right ventricular size is normal. No increase in right ventricular wall thickness. Right ventricular systolic function is normal. There is normal pulmonary artery systolic pressure. The tricuspid regurgitant velocity is 2.20 m/s, and  with an assumed right atrial pressure of 3 mmHg, the estimated right ventricular systolic pressure is [id number] mmHg. Left [facility]: Left atrial size was severely dilated. Right [facility]: Right atrial size was moderately dilated. Pericardium: Trivial pericardial effusion is present. The pericardial effusion is posterior to the left ventricle. Mitral Valve: The mitral valve is degenerative in appearance. There is moderate thickening of the mitral valve leaflet(s). There is moderate calcification of the mitral valve leaflet(s). Moderate mitral annular calcification. Mild mitral valve regurgitation. No evidence of mitral valve stenosis. Tricuspid Valve: The tricuspid valve is normal in structure. Tricuspid valve regurgitation is mild . No evidence of tricuspid stenosis. Aortic Valve: The aortic valve is tricuspid. There is moderate calcification of the aortic valve. Aortic valve regurgitation is mild. Mild to moderate aortic valve sclerosis/calcification is present, without any evidence of aortic stenosis. Pulmonic Valve: The pulmonic valve was normal in structure. Pulmonic valve regurgitation is not visualized. No evidence of pulmonic stenosis. Aorta: The aortic root is normal in size and structure. Venous: The inferior vena cava is normal in size with greater than 50% respiratory variability, suggesting right atrial pressure of 3 mmHg. IAS/Shunts: No atrial level shunt detected by color flow Doppler.  LEFT VENTRICLE PLAX 2D LVIDd:         2.04 cm LVIDs:         1.58 cm LV PW:         0.42 cm LV IVS:        1.42 cm LVOT diam:     1.40 cm LV SV:         21 LV SV Index:   14 LVOT Area:     1.54 cm  LEFT ATRIUM              Index LA diam:        3.20 cm  2.06  cm/m LA Vol (A2C):   174.0 ml 111.78 ml/m LA Vol (A4C):   178.0 ml 114.35 ml/m LA Biplane Vol: 185.0 ml 118.84 ml/m  AORTIC VALVE LVOT Vmax:   92.50 cm/s LVOT Vmean:  58.500 cm/s LVOT VTI:    0.139 m  AORTA Ao Root diam: 3.10 cm Ao Asc diam:  3.20 cm MITRAL VALVE                TRICUSPID VALVE MV Area (PHT): 5.93 cm     TR Peak grad:   19.4 mmHg MV Decel Time: 128 msec     TR Vmax:        220.00 cm/s MV E velocity: 131.00 cm/s MV A velocity: 41.00 cm/s   SHUNTS MV E/A ratio:  3.20         Systemic VTI:  0.14 m  Systemic Diam: 1.40 cm Jenkins Rouge MD Electronically signed by Jenkins Rouge MD Signature Date/Time: 10/31/2021/5:37:27 PM    Final    CT HEAD CODE STROKE WO CONTRAST  Result Date: 11/03/2021 CLINICAL DATA:  Code stroke.  87 year old female. EXAM: CT HEAD WITHOUT CONTRAST TECHNIQUE: Contiguous axial images were obtained from the base of the skull through the vertex without intravenous contrast. COMPARISON:  Brain MRI 10/22/2012.  Head CT 10/20/2021. FINDINGS: Brain: Mild motion artifact. Stable cerebral volume. No midline shift, mass effect, or evidence of intracranial mass lesion. No acute intracranial hemorrhage identified. No ventriculomegaly. Some chronic cortical encephalomalacia in the right hemisphere. Confluent bilateral white matter hypodensity. Gray-white matter differentiation appears stable since last month. No cortically based acute infarct identified. Vascular: Calcified atherosclerosis at the skull base. No suspicious intracranial vascular hyperdensity. Skull: No acute osseous abnormality identified. Sinuses/Orbits: Chronic paranasal sinusitis with mucoperiosteal thickening. Progressive ethmoid mucosal thickening since last month. Tympanic cavities and mastoids remain well aerated. Other: Leftward gaze. Broad-based right posterior scalp hematoma on series 3, image 34 measuring up to 7 mm in thickness. And there is a 2nd slightly larger left posterosuperior  convexity scalp hematoma on image 58. Underlying calvarium appears intact. ASPECTS [facility] Stroke Program Early CT Score) Total score (0-10 with 10 being normal): 10 (chronic cortical encephalomalacia in the right hemisphere). IMPRESSION: 1. Mild motion artifact, with no acute cortically based infarct or intracranial hemorrhage identified. ASPECTS 10. 2. Bilateral scalp hematomas.  No skull fracture identified. 3. Chronic right MCA infarct. Chronic bilateral white matter disease. 4. These results were communicated to Dr. Leonel Ramsay at 4:38 am on 11/04/2021 by text page via the [facility] messaging system. Electronically Signed   By: Genevie Ann M.D.   On: 11/15/2021 04:38   CT ANGIO HEAD NECK W WO CM (CODE STROKE)  Result Date: 11/02/2021 CLINICAL DATA:  87 year old female.  Code stroke. EXAM: CT ANGIOGRAPHY HEAD AND NECK CT PERFUSION BRAIN TECHNIQUE: Multidetector CT imaging of the head and neck was performed using the standard protocol during bolus administration of intravenous contrast. Multiplanar CT image reconstructions and MIPs were obtained to evaluate the vascular anatomy. Carotid stenosis measurements (when applicable) are obtained utilizing NASCET criteria, using the distal internal carotid diameter as the denominator. Multiphase CT imaging of the brain was performed following IV bolus contrast injection. Subsequent parametric perfusion maps were calculated using RAPID software. CONTRAST:  138mL OMNIPAQUE IOHEXOL 350 MG/ML SOLN COMPARISON:  Plain head CT 0431 hours. FINDINGS: CT Brain Perfusion Findings: ASPECTS: 10 CBF (<30%) Volume: 32mL Perfusion (Tmax>6.0s) volume: 9mL.  Hypoperfusion index 0.5. Mismatch Volume: 73mL Infarction Location:Posterior left MCA territory. CTA NECK Skeleton: No acute osseous abnormality identified. No skull fracture identified despite scalp hematoma. Cervical spine degeneration. Upper chest: Small layering left pleural effusion. Other neck: Small volume retained secretions in  the right nasopharynx. No acute neck soft tissue finding. Aortic arch: 3 vessel arch configuration. Tortuous arch and proximal great vessels with mild calcified plaque. Right carotid system: Tortuous right CCA. Minor plaque at the right carotid bifurcation mostly affecting the ECA. Tortuosity of the right ICA without stenosis. Left carotid system: Similar tortuosity with minor plaque and no stenosis. Vertebral arteries: Similar tortuosity and mild plaque.  No stenosis to the skull base. CTA HEAD Posterior circulation: Patent distal vertebral arteries with mild tortuosity, no plaque or stenosis. Patent PICA origins. Fenestrated vertebrobasilar junction (normal variant) without stenosis. Patent basilar artery without stenosis. Patent SCA and PCA origins. Posterior communicating arteries are diminutive or absent. Bilateral PCA branches  are within normal limits. Anterior circulation: Both ICA siphons are patent with calcified plaque. On the right there is only mild siphon stenosis. On the left there is mild to moderate supraclinoid stenosis best demonstrated on series 9, image 92. Patent carotid termini. Patent MCA and ACA origins. Anterior communicating artery, bilateral ACA branches, right MCA M1 segment, and right MCA branches appear within normal limits. Left MCA M1 segment is patent past the anterior temporal artery and to the MCA bifurcation but there is a proximal right M2 branch occlusion on series 9, image 125 which appears to be the dominant posterior branch. See also series 12, image 25 and series 10, image 18. Venous sinuses: Patent. Anatomic variants: None significant. Review of the MIP images confirms the above findings IMPRESSION: 1. Positive for Emergent Large Vessel Occlusion: Left MCA M2 posterior division near its origin. 2. Otherwise generalized vessel tortuosity but mild for age atherosclerosis in the head and neck. Mild to moderate left ICA siphon supraclinoid stenosis due to calcified plaque. But  no other hemodynamically significant stenosis. Preliminary report of the above communicated to Dr. Leonel Ramsay at 4:56 am on 10/23/2021 by text page via the [facility] messaging system. Electronically Signed   By: Genevie Ann M.D.   On: 11/06/2021 05:01    Labs:  CBC: Recent Labs    05/29/21 1425 10/24/2021 0425 11/16/2021 0451 10/28/21 0858  WBC 5.5 7.4  --  9.1  HGB 11.3* 11.0* 11.2* 9.9*  HCT 33.7* 32.7* 33.0* 29.3*  PLT 159.0 175  --  175    COAGS: Recent Labs    11/06/2021 0425  INR 1.1  APTT 26    BMP: Recent Labs    05/29/21 1425 07/03/21 1544 11/10/2021 0425 11/02/2021 0451 10/28/21 0858  NA 140 139 136 137 137  K 3.3* 3.6 3.2* 3.1* 4.1  CL 101 98 98 97* 105  CO2 31 33* 27  --  20*  GLUCOSE 121* 128* 138* 129* 155*  BUN 30* 21 24* 25* 32*  CALCIUM 9.7 9.5 8.9  --  8.5*  CREATININE 1.50* 1.31* 1.41* 1.30* 1.94*  GFRNONAA  --  37* 34*  --  23*    LIVER FUNCTION TESTS: Recent Labs    05/29/21 1425 11/04/2021 0425  BILITOT 1.0 1.4*  AST 27 33  ALT 17 25  ALKPHOS 43 58  PROT 7.0 6.4*  ALBUMIN 4.2 3.1*    Assessment and Plan:  Occluded left MCA distal M2 branch s/p revascularization 11/11/2021  Patient remains non-verbal with no movement noted to the right side observed. She does not follow commands. Right CFA vascular access site is clean and dry. MRI completed this morning with results pending.   Patient plan per Primary teams/neurology. NIR remains available as needed.   Electronically Signed: Soyla Dryer, AGACNP-BC [phone] 10/28/2021, 10:07 AM   I spent a total of 15 Minutes at the the patient's bedside AND on the patient's hospital floor or unit, greater than 50% of which was counseling/coordinating care for Occluded left MCA distal M2 branch s/p revascularization

## 2021-10-28 NOTE — Plan of Care (Signed)
Per nursing, patient has some dyspnea and on chart review I confirmed that she has been transitioned to palliative and comfort measures only.  She does have a listed allergy of low severity rash (on chest and side only) to hydrocodone which could potentially cross-react with morphine.  Therefore I am ordering a low-dose (2.5 mg instead of 5 mg) and additionally ordering Benadryl as needed for rash, itching and and albuterol as needed for wheezing .  Brooke Dare MD-PhD [facility] [phone]  Available 7 AM to 7 PM, outside these hours please contact Neurologist on call listed on AMION

## 2021-10-28 NOTE — Progress Notes (Signed)
Occupational Therapy Discharge Patient Details Name: Latoya Merritt MRN: [id number] DOB: 08-Oct-1920 Today's Date: 10/28/2021 Time:  -     Patient discharged from OT services secondary to medical decline - will need to re-order OT to resume therapy services. Pt is comfort measures at this time  Please see latest therapy progress note for current level of functioning and progress toward goals.    Progress and discharge plan discussed with patient and/or caregiver: Patient/Caregiver agrees with plan  Deberah Pelton, OTR/L  Acute Rehabilitation Services Pager: [phone] Office: [phone] .   Mateo Flow 10/28/2021, 1:53 PM

## 2021-10-29 ENCOUNTER — Ambulatory Visit: Payer: Medicare Other | Admitting: Internal Medicine

## 2021-10-29 DIAGNOSIS — I6602 Occlusion and stenosis of left middle cerebral artery: Secondary | ICD-10-CM | POA: Diagnosis not present

## 2021-10-29 MED ORDER — SCOPOLAMINE 1 MG/3DAYS TD PT72
1.0000 | MEDICATED_PATCH | TRANSDERMAL | Status: DC
  Administered 2021-10-29: 1.5 mg via TRANSDERMAL
  Filled 2021-10-29: qty 1

## 2021-11-04 ENCOUNTER — Other Ambulatory Visit: Payer: Self-pay | Admitting: Cardiovascular Disease

## 2021-11-20 ENCOUNTER — Other Ambulatory Visit: Payer: Self-pay | Admitting: Cardiovascular Disease

## 2021-11-20 NOTE — Death Summary Note (Addendum)
Latoya Merritt MRN: [id number] DOB: 10-29-1920 DOA: November 06, 2021   PCP: Crecencio Mc, MD  Admit date: November 06, 2021 Date of Death: November 08, 2021.  Final Diagnosis:  Active Problems:   Stroke (cerebrum) (HCC)   Middle cerebral artery embolism, left  HPI and Hospital course:  [name] is a 87 y.o. female with history of hypertension, hyperlipidemia, CAD, atrial fibrillation on Eliquis, CKD stage 3, DVT who presents with right-sided weakness and aphasia. Imaging demonstrated a Left M2 occlusion with a large penumbra. She underwent partial revascularization of occluded Lt MCA achieving a TICI 2C revascularization. Post op, remained globally aphasic with Right neglect. MRI Brain with evolving moderate to large acute left posterior MCA distribution infarct with associated susceptibility artifact suspicious for petechial hemorrhage and/or SAH. Her poor prognosis was discussed with family and she was transitioned to comfort care. She passed away at 0349 on 11/08/21.  Active Hospital Diagnosis and management:  Stroke: Acute left MCA infarct embolic secondary to Atrial Fib s/p partial revascularization of left MCA distal M2 with TICI 2C Code Stroke CT head 1. Mild motion artifact, with no acute cortically based infarct or intracranial hemorrhage identified. ASPECTS 10. 2. Bilateral scalp hematomas.  No skull fracture identified. 3. Chronic right MCA infarct. Chronic bilateral white matter disease. CTA head & neck 1. Positive for Emergent Large Vessel Occlusion: Left MCA M2 posterior division near its origin. 2. Otherwise generalized vessel tortuosity but mild for age atherosclerosis in the head and neck. Mild to moderate left ICA siphon supraclinoid stenosis due to calcified plaque. But no other hemodynamically significant stenosis CT perfusion CBF (<30%) Volume: 34mL Perfusion (Tmax>6.0s) volume: 15mL. Hypoperfusion index 0.5. Mismatch Volume: 59mL. Infarction Location:Posterior left MCA  territory. Cerebral angio S/P partial revascularization of occluded Lt MCA distal M2 branch with x1 pass with 42mmx 40 mm solitaire X retriever and contact aspiration,x1 pass with contact aspiration,x1 pass with [employer] retriever an contact aspiration and x1 pass with zoom 35 and contact aspiration achieving a TICI 2C revascularization  Post IR CT Mild post Lt perisylvian hyper density and Lt putamen contrast bush. Early signs of lt MCA hypodensity. No mass effect.  MRI evolving moderate to large sized left posterior division MCA infarct.  Slight petechial hemorrhage in the subarachnoid's space and sylvian fissure  2D Echo Global hypokinesis in the inferior wall and septum.  Diminished ejection fraction 30 to 35%. LDL 60 mg percent  HgbA1c 5.3  VTE prophylaxis - SCD's  Eliquis (apixaban) daily prior to admission, now on No antithrombotic.  Disposition:  DNR, comfort care. Patient passed away.       Hypoxic respiratory failure: She became hypoxic overnight and is put on nonrebreather oxygen.  She is also become more tachycardic and increased work of breathing.  - Transitioned to comfort care by family.    Hypertension Home meds:  none UnStable On cleviprex gtt  SBP < 160 Long-term BP goal normotensive   Hyperlipidemia Home meds:  none, LDL pending, goal < 70 Continue statin at discharge   Atrial Fib on Eliquis  Eliquis on hold  EKG with A fib  Echo pending    Diabetes type II Controlled Home meds:  none HgbA1c No results found for requested labs within last 26280 hours., goal < 7.0 CBGs Recent Labs (last 2 labs)      Recent Labs    2021/11/06 0421  GLUCAP 115*      SSI   Other Stroke Risk Factors Advanced Age >/= 63  Hx of  stroke Coronary artery disease Congestive heart failure   Other Active Problems Vascular dementia without behavioral disturbances Acute Kidney Injury on top of CKD stage 3b  Erick Blinks [facility] Pager Number [phone]

## 2021-11-20 NOTE — Progress Notes (Signed)
Pt was given Pain PRN 2.5 mg Morphine SL. 7.5 mg was Wasted in Med room as witnessed by Prudy Feeler, RN.  Jannifer Hick, RN

## 2021-11-20 DEATH — deceased

## 2021-12-04 ENCOUNTER — Other Ambulatory Visit: Payer: Self-pay | Admitting: Internal Medicine
